# Patient Record
Sex: Female | Born: 1965 | Race: White | Hispanic: No | Smoking: Current every day smoker
Health system: Southern US, Community
[De-identification: ages and names within clinical notes are randomized; demographics above are authoritative.]

## PROBLEM LIST (undated history)

## (undated) DIAGNOSIS — D649 Anemia, unspecified: Secondary | ICD-10-CM

## (undated) DIAGNOSIS — K3184 Gastroparesis: Secondary | ICD-10-CM

## (undated) DIAGNOSIS — E279 Disorder of adrenal gland, unspecified: Secondary | ICD-10-CM

## (undated) DIAGNOSIS — M1990 Unspecified osteoarthritis, unspecified site: Secondary | ICD-10-CM

## (undated) DIAGNOSIS — K069 Disorder of gingiva and edentulous alveolar ridge, unspecified: Secondary | ICD-10-CM

## (undated) DIAGNOSIS — N390 Urinary tract infection, site not specified: Secondary | ICD-10-CM

## (undated) DIAGNOSIS — M797 Fibromyalgia: Secondary | ICD-10-CM

## (undated) DIAGNOSIS — M8580 Other specified disorders of bone density and structure, unspecified site: Secondary | ICD-10-CM

## (undated) DIAGNOSIS — F419 Anxiety disorder, unspecified: Secondary | ICD-10-CM

## (undated) DIAGNOSIS — K589 Irritable bowel syndrome without diarrhea: Secondary | ICD-10-CM

## (undated) DIAGNOSIS — R0602 Shortness of breath: Secondary | ICD-10-CM

## (undated) DIAGNOSIS — E1140 Type 2 diabetes mellitus with diabetic neuropathy, unspecified: Secondary | ICD-10-CM

## (undated) DIAGNOSIS — F32A Depression, unspecified: Secondary | ICD-10-CM

## (undated) DIAGNOSIS — A048 Other specified bacterial intestinal infections: Secondary | ICD-10-CM

## (undated) DIAGNOSIS — E785 Hyperlipidemia, unspecified: Secondary | ICD-10-CM

## (undated) DIAGNOSIS — F329 Major depressive disorder, single episode, unspecified: Secondary | ICD-10-CM

## (undated) DIAGNOSIS — R87619 Unspecified abnormal cytological findings in specimens from cervix uteri: Secondary | ICD-10-CM

## (undated) DIAGNOSIS — K219 Gastro-esophageal reflux disease without esophagitis: Secondary | ICD-10-CM

## (undated) DIAGNOSIS — I739 Peripheral vascular disease, unspecified: Secondary | ICD-10-CM

## (undated) DIAGNOSIS — E119 Type 2 diabetes mellitus without complications: Secondary | ICD-10-CM

## (undated) DIAGNOSIS — I10 Essential (primary) hypertension: Secondary | ICD-10-CM

## (undated) DIAGNOSIS — T7840XA Allergy, unspecified, initial encounter: Secondary | ICD-10-CM

## (undated) DIAGNOSIS — E278 Other specified disorders of adrenal gland: Secondary | ICD-10-CM

## (undated) HISTORY — DX: Unspecified osteoarthritis, unspecified site: M19.90

## (undated) HISTORY — DX: Hyperlipidemia, unspecified: E78.5

## (undated) HISTORY — DX: Unspecified abnormal cytological findings in specimens from cervix uteri: R87.619

## (undated) HISTORY — PX: TUBAL LIGATION: SHX77

## (undated) HISTORY — DX: Other specified disorders of adrenal gland: E27.8

## (undated) HISTORY — PX: DENTAL SURGERY: SHX609

## (undated) HISTORY — PX: ADENOIDECTOMY: SUR15

## (undated) HISTORY — DX: Other specified bacterial intestinal infections: A04.8

## (undated) HISTORY — PX: TYMPANOPLASTY: SHX33

## (undated) HISTORY — DX: Type 2 diabetes mellitus without complications: E11.9

## (undated) HISTORY — DX: Disorder of adrenal gland, unspecified: E27.9

## (undated) HISTORY — DX: Irritable bowel syndrome, unspecified: K58.9

## (undated) HISTORY — DX: Fibromyalgia: M79.7

## (undated) HISTORY — DX: Allergy, unspecified, initial encounter: T78.40XA

## (undated) HISTORY — DX: Peripheral vascular disease, unspecified: I73.9

---

## 1999-03-16 ENCOUNTER — Encounter: Admission: RE | Admit: 1999-03-16 | Discharge: 1999-03-16 | Payer: Self-pay | Admitting: Family Medicine

## 2000-01-26 ENCOUNTER — Encounter: Admission: RE | Admit: 2000-01-26 | Discharge: 2000-01-26 | Payer: Self-pay | Admitting: Family Medicine

## 2000-01-28 ENCOUNTER — Encounter: Admission: RE | Admit: 2000-01-28 | Discharge: 2000-01-28 | Payer: Self-pay | Admitting: Family Medicine

## 2002-03-24 ENCOUNTER — Emergency Department (HOSPITAL_COMMUNITY): Admission: EM | Admit: 2002-03-24 | Discharge: 2002-03-24 | Payer: Self-pay | Admitting: *Deleted

## 2002-04-09 ENCOUNTER — Encounter: Admission: RE | Admit: 2002-04-09 | Discharge: 2002-04-09 | Payer: Self-pay | Admitting: Family Medicine

## 2003-01-30 ENCOUNTER — Emergency Department (HOSPITAL_COMMUNITY): Admission: EM | Admit: 2003-01-30 | Discharge: 2003-01-30 | Payer: Self-pay | Admitting: Emergency Medicine

## 2003-02-13 ENCOUNTER — Encounter: Admission: RE | Admit: 2003-02-13 | Discharge: 2003-02-13 | Payer: Self-pay | Admitting: Family Medicine

## 2003-03-20 ENCOUNTER — Encounter: Admission: RE | Admit: 2003-03-20 | Discharge: 2003-03-20 | Payer: Self-pay | Admitting: Sports Medicine

## 2003-07-16 ENCOUNTER — Encounter: Admission: RE | Admit: 2003-07-16 | Discharge: 2003-07-16 | Payer: Self-pay | Admitting: Family Medicine

## 2003-09-17 ENCOUNTER — Emergency Department (HOSPITAL_COMMUNITY): Admission: AD | Admit: 2003-09-17 | Discharge: 2003-09-17 | Payer: Self-pay | Admitting: Family Medicine

## 2003-10-13 ENCOUNTER — Encounter: Admission: RE | Admit: 2003-10-13 | Discharge: 2003-10-13 | Payer: Self-pay | Admitting: Family Medicine

## 2003-10-13 ENCOUNTER — Other Ambulatory Visit: Admission: RE | Admit: 2003-10-13 | Discharge: 2003-10-13 | Payer: Self-pay | Admitting: Family Medicine

## 2003-11-12 ENCOUNTER — Encounter: Admission: RE | Admit: 2003-11-12 | Discharge: 2003-11-12 | Payer: Self-pay | Admitting: Sports Medicine

## 2004-09-04 ENCOUNTER — Emergency Department (HOSPITAL_COMMUNITY): Admission: EM | Admit: 2004-09-04 | Discharge: 2004-09-04 | Payer: Self-pay | Admitting: Emergency Medicine

## 2005-06-09 ENCOUNTER — Emergency Department (HOSPITAL_COMMUNITY): Admission: EM | Admit: 2005-06-09 | Discharge: 2005-06-09 | Payer: Self-pay | Admitting: Emergency Medicine

## 2005-09-01 ENCOUNTER — Emergency Department (HOSPITAL_COMMUNITY): Admission: EM | Admit: 2005-09-01 | Discharge: 2005-09-01 | Payer: Self-pay | Admitting: Emergency Medicine

## 2005-12-30 ENCOUNTER — Emergency Department (HOSPITAL_COMMUNITY): Admission: EM | Admit: 2005-12-30 | Discharge: 2005-12-30 | Payer: Self-pay | Admitting: Emergency Medicine

## 2006-02-18 ENCOUNTER — Encounter (INDEPENDENT_AMBULATORY_CARE_PROVIDER_SITE_OTHER): Payer: Self-pay | Admitting: *Deleted

## 2006-02-18 LAB — CONVERTED CEMR LAB

## 2006-03-09 ENCOUNTER — Other Ambulatory Visit: Admission: RE | Admit: 2006-03-09 | Discharge: 2006-03-09 | Payer: Self-pay | Admitting: Family Medicine

## 2006-03-09 ENCOUNTER — Ambulatory Visit: Payer: Self-pay | Admitting: Family Medicine

## 2006-03-22 ENCOUNTER — Ambulatory Visit: Payer: Self-pay | Admitting: Sports Medicine

## 2006-04-19 ENCOUNTER — Ambulatory Visit: Payer: Self-pay | Admitting: Family Medicine

## 2006-04-19 ENCOUNTER — Ambulatory Visit: Payer: Self-pay | Admitting: Sports Medicine

## 2006-04-27 ENCOUNTER — Encounter: Admission: RE | Admit: 2006-04-27 | Discharge: 2006-07-26 | Payer: Self-pay | Admitting: Sports Medicine

## 2006-05-03 ENCOUNTER — Encounter: Admission: RE | Admit: 2006-05-03 | Discharge: 2006-05-03 | Payer: Self-pay | Admitting: Sports Medicine

## 2006-08-17 DIAGNOSIS — K589 Irritable bowel syndrome without diarrhea: Secondary | ICD-10-CM | POA: Insufficient documentation

## 2006-08-17 DIAGNOSIS — N809 Endometriosis, unspecified: Secondary | ICD-10-CM

## 2006-08-17 DIAGNOSIS — F17200 Nicotine dependence, unspecified, uncomplicated: Secondary | ICD-10-CM | POA: Insufficient documentation

## 2006-08-17 HISTORY — DX: Endometriosis, unspecified: N80.9

## 2006-08-18 ENCOUNTER — Encounter (INDEPENDENT_AMBULATORY_CARE_PROVIDER_SITE_OTHER): Payer: Self-pay | Admitting: *Deleted

## 2006-10-12 ENCOUNTER — Emergency Department (HOSPITAL_COMMUNITY): Admission: EM | Admit: 2006-10-12 | Discharge: 2006-10-12 | Payer: Self-pay | Admitting: Emergency Medicine

## 2006-10-17 ENCOUNTER — Telehealth: Payer: Self-pay | Admitting: *Deleted

## 2006-10-17 ENCOUNTER — Encounter: Payer: Self-pay | Admitting: *Deleted

## 2006-11-20 ENCOUNTER — Ambulatory Visit: Payer: Self-pay | Admitting: Family Medicine

## 2006-11-20 ENCOUNTER — Encounter (INDEPENDENT_AMBULATORY_CARE_PROVIDER_SITE_OTHER): Payer: Self-pay | Admitting: Family Medicine

## 2006-11-20 DIAGNOSIS — R5381 Other malaise: Secondary | ICD-10-CM | POA: Insufficient documentation

## 2006-11-20 DIAGNOSIS — IMO0001 Reserved for inherently not codable concepts without codable children: Secondary | ICD-10-CM | POA: Insufficient documentation

## 2006-11-20 DIAGNOSIS — R5383 Other fatigue: Secondary | ICD-10-CM

## 2006-11-20 LAB — CONVERTED CEMR LAB
ALT: 11 units/L (ref 0–35)
ANA Titer 1: 1:40 {titer} — ABNORMAL HIGH
AST: 9 units/L (ref 0–37)
Albumin: 4.3 g/dL (ref 3.5–5.2)
Alkaline Phosphatase: 59 units/L (ref 39–117)
Anti Nuclear Antibody(ANA): POSITIVE — AB
BUN: 16 mg/dL (ref 6–23)
CO2: 20 meq/L (ref 19–32)
Calcium: 9.3 mg/dL (ref 8.4–10.5)
Chloride: 107 meq/L (ref 96–112)
Creatinine, Ser: 0.91 mg/dL (ref 0.40–1.20)
Glucose, Bld: 100 mg/dL — ABNORMAL HIGH (ref 70–99)
HCT: 38.9 % (ref 36.0–46.0)
Hemoglobin: 12.5 g/dL (ref 12.0–15.0)
MCHC: 32.1 g/dL (ref 30.0–36.0)
MCV: 85.5 fL (ref 78.0–100.0)
Platelets: 482 10*3/uL — ABNORMAL HIGH (ref 150–400)
Potassium: 4.2 meq/L (ref 3.5–5.3)
RBC: 4.55 M/uL (ref 3.87–5.11)
RDW: 14.9 % — ABNORMAL HIGH (ref 11.5–14.0)
Rhuematoid fact SerPl-aCnc: <20 intl units/mL (ref 0–20)
Sed Rate: 10 mm/hr (ref 0–22)
Sodium: 139 meq/L (ref 135–145)
TSH: 2.426 microintl units/mL (ref 0.350–5.50)
Total Bilirubin: 0.1 mg/dL — ABNORMAL LOW (ref 0.3–1.2)
Total Protein: 7 g/dL (ref 6.0–8.3)
Vitamin B-12: 211 pg/mL (ref 211–911)
WBC: 10.2 10*3/uL (ref 4.0–10.5)

## 2006-12-07 ENCOUNTER — Ambulatory Visit: Payer: Self-pay | Admitting: Family Medicine

## 2006-12-13 ENCOUNTER — Telehealth: Payer: Self-pay | Admitting: *Deleted

## 2007-01-03 ENCOUNTER — Ambulatory Visit: Payer: Self-pay

## 2007-01-03 ENCOUNTER — Encounter (INDEPENDENT_AMBULATORY_CARE_PROVIDER_SITE_OTHER): Payer: Self-pay | Admitting: *Deleted

## 2007-01-03 LAB — CONVERTED CEMR LAB
Bilirubin Urine: NEGATIVE
Blood in Urine, dipstick: NEGATIVE
Epithelial cells, urine: >20 /lpf
Glucose, Urine, Semiquant: NEGATIVE
Ketones, urine, test strip: NEGATIVE
Nitrite: POSITIVE
Protein, U semiquant: NEGATIVE
Specific Gravity, Urine: 1.025
Urobilinogen, UA: 0.2
pH: 6

## 2007-01-04 ENCOUNTER — Encounter (INDEPENDENT_AMBULATORY_CARE_PROVIDER_SITE_OTHER): Payer: Self-pay | Admitting: *Deleted

## 2007-02-02 ENCOUNTER — Telehealth (INDEPENDENT_AMBULATORY_CARE_PROVIDER_SITE_OTHER): Payer: Self-pay | Admitting: *Deleted

## 2007-02-02 ENCOUNTER — Ambulatory Visit: Payer: Self-pay | Admitting: Family Medicine

## 2007-03-15 ENCOUNTER — Ambulatory Visit: Payer: Self-pay | Admitting: Sports Medicine

## 2007-03-16 ENCOUNTER — Encounter: Payer: Self-pay | Admitting: Family Medicine

## 2007-03-22 ENCOUNTER — Encounter (INDEPENDENT_AMBULATORY_CARE_PROVIDER_SITE_OTHER): Payer: Self-pay | Admitting: *Deleted

## 2007-03-22 ENCOUNTER — Ambulatory Visit: Payer: Self-pay | Admitting: Family Medicine

## 2007-03-22 ENCOUNTER — Telehealth (INDEPENDENT_AMBULATORY_CARE_PROVIDER_SITE_OTHER): Payer: Self-pay | Admitting: *Deleted

## 2007-03-22 DIAGNOSIS — J309 Allergic rhinitis, unspecified: Secondary | ICD-10-CM | POA: Insufficient documentation

## 2007-04-02 ENCOUNTER — Encounter: Payer: Self-pay | Admitting: Family Medicine

## 2007-04-02 ENCOUNTER — Ambulatory Visit: Payer: Self-pay | Admitting: Family Medicine

## 2007-04-02 ENCOUNTER — Other Ambulatory Visit: Admission: RE | Admit: 2007-04-02 | Discharge: 2007-04-02 | Payer: Self-pay | Admitting: Family Medicine

## 2007-04-02 LAB — CONVERTED CEMR LAB: Pap Smear: NORMAL

## 2007-04-03 LAB — CONVERTED CEMR LAB
Chlamydia, DNA Probe: NEGATIVE
GC Probe Amp, Genital: NEGATIVE

## 2007-04-05 ENCOUNTER — Encounter: Payer: Self-pay | Admitting: Family Medicine

## 2007-05-28 ENCOUNTER — Telehealth: Payer: Self-pay | Admitting: *Deleted

## 2007-06-05 ENCOUNTER — Encounter: Payer: Self-pay | Admitting: *Deleted

## 2007-09-14 ENCOUNTER — Telehealth: Payer: Self-pay | Admitting: *Deleted

## 2007-10-09 ENCOUNTER — Encounter: Payer: Self-pay | Admitting: *Deleted

## 2008-03-04 ENCOUNTER — Encounter: Payer: Self-pay | Admitting: Family Medicine

## 2008-03-09 ENCOUNTER — Encounter: Payer: Self-pay | Admitting: Family Medicine

## 2008-04-07 ENCOUNTER — Ambulatory Visit: Payer: Self-pay | Admitting: Family Medicine

## 2008-04-07 ENCOUNTER — Encounter: Payer: Self-pay | Admitting: Family Medicine

## 2008-04-07 DIAGNOSIS — F329 Major depressive disorder, single episode, unspecified: Secondary | ICD-10-CM | POA: Insufficient documentation

## 2008-04-07 DIAGNOSIS — K219 Gastro-esophageal reflux disease without esophagitis: Secondary | ICD-10-CM | POA: Insufficient documentation

## 2008-04-07 DIAGNOSIS — R259 Unspecified abnormal involuntary movements: Secondary | ICD-10-CM | POA: Insufficient documentation

## 2008-04-07 DIAGNOSIS — F411 Generalized anxiety disorder: Secondary | ICD-10-CM | POA: Insufficient documentation

## 2008-04-09 LAB — CONVERTED CEMR LAB
Amphetamine Screen, Ur: NEGATIVE
BUN: 12 mg/dL (ref 6–23)
Barbiturate Quant, Ur: NEGATIVE
Benzodiazepines.: NEGATIVE
CO2: 19 meq/L (ref 19–32)
Calcium: 9.1 mg/dL (ref 8.4–10.5)
Chloride: 106 meq/L (ref 96–112)
Cocaine Metabolites: POSITIVE — AB
Creatinine, Ser: 1.02 mg/dL (ref 0.40–1.20)
Creatinine,U: 36.1 mg/dL
Ethyl Alcohol: <10 mg/dL (ref <10)
Glucose, Bld: 107 mg/dL — ABNORMAL HIGH (ref 70–99)
Marijuana Metabolite: NEGATIVE
Methadone: NEGATIVE
Opiate Screen, Urine: NEGATIVE
Phencyclidine (PCP): NEGATIVE
Potassium: 4.6 meq/L (ref 3.5–5.3)
Propoxyphene: NEGATIVE
Sodium: 138 meq/L (ref 135–145)
TSH: 3.156 microintl units/mL (ref 0.350–4.50)

## 2009-04-16 ENCOUNTER — Emergency Department (HOSPITAL_COMMUNITY): Admission: EM | Admit: 2009-04-16 | Discharge: 2009-04-16 | Payer: Self-pay | Admitting: Emergency Medicine

## 2009-04-29 ENCOUNTER — Ambulatory Visit: Payer: Self-pay | Admitting: Nurse Practitioner

## 2009-04-29 DIAGNOSIS — R030 Elevated blood-pressure reading, without diagnosis of hypertension: Secondary | ICD-10-CM | POA: Insufficient documentation

## 2009-06-01 ENCOUNTER — Ambulatory Visit: Payer: Self-pay | Admitting: Nurse Practitioner

## 2009-06-01 ENCOUNTER — Other Ambulatory Visit: Admission: RE | Admit: 2009-06-01 | Discharge: 2009-06-01 | Payer: Self-pay | Admitting: Internal Medicine

## 2009-06-01 DIAGNOSIS — N3941 Urge incontinence: Secondary | ICD-10-CM | POA: Insufficient documentation

## 2009-06-01 LAB — CONVERTED CEMR LAB
Bilirubin Urine: NEGATIVE
Blood in Urine, dipstick: NEGATIVE
Glucose, Urine, Semiquant: NEGATIVE
KOH Prep: NEGATIVE
Ketones, urine, test strip: NEGATIVE
Nitrite: NEGATIVE
OCCULT 1: NEGATIVE
Protein, U semiquant: 30
Rapid HIV Screen: NEGATIVE
Specific Gravity, Urine: 1.025
Urobilinogen, UA: 0.2
WBC Urine, dipstick: NEGATIVE
pH: 5

## 2009-06-02 ENCOUNTER — Encounter (INDEPENDENT_AMBULATORY_CARE_PROVIDER_SITE_OTHER): Payer: Self-pay | Admitting: Nurse Practitioner

## 2009-06-04 ENCOUNTER — Telehealth (INDEPENDENT_AMBULATORY_CARE_PROVIDER_SITE_OTHER): Payer: Self-pay | Admitting: Nurse Practitioner

## 2009-06-04 ENCOUNTER — Encounter (INDEPENDENT_AMBULATORY_CARE_PROVIDER_SITE_OTHER): Payer: Self-pay | Admitting: Nurse Practitioner

## 2009-06-04 ENCOUNTER — Encounter (INDEPENDENT_AMBULATORY_CARE_PROVIDER_SITE_OTHER): Payer: Self-pay | Admitting: *Deleted

## 2009-06-04 DIAGNOSIS — E559 Vitamin D deficiency, unspecified: Secondary | ICD-10-CM | POA: Insufficient documentation

## 2009-06-04 DIAGNOSIS — E7800 Pure hypercholesterolemia, unspecified: Secondary | ICD-10-CM | POA: Insufficient documentation

## 2009-06-04 LAB — CONVERTED CEMR LAB
ALT: 23 units/L (ref 0–35)
ANA Titer 1: 1:40 {titer} — ABNORMAL HIGH
AST: 15 units/L (ref 0–37)
Albumin: 4.5 g/dL (ref 3.5–5.2)
Alkaline Phosphatase: 77 units/L (ref 39–117)
Anti Nuclear Antibody(ANA): POSITIVE — AB
BUN: 13 mg/dL (ref 6–23)
Basophils Absolute: 0 10*3/uL (ref 0.0–0.1)
Basophils Relative: 0 % (ref 0–1)
CO2: 22 meq/L (ref 19–32)
CRP: 1.2 mg/dL — ABNORMAL HIGH (ref <0.6)
Calcium: 9.7 mg/dL (ref 8.4–10.5)
Chlamydia, DNA Probe: NEGATIVE
Chloride: 104 meq/L (ref 96–112)
Cholesterol: 250 mg/dL — ABNORMAL HIGH (ref 0–200)
Creatinine, Ser: 0.91 mg/dL (ref 0.40–1.20)
ENA SM Ab Ser-aCnc: <0.2 (ref <1.0)
Eosinophils Absolute: 0.2 10*3/uL (ref 0.0–0.7)
Eosinophils Relative: 2 % (ref 0–5)
GC Probe Amp, Genital: NEGATIVE
Glucose, Bld: 101 mg/dL — ABNORMAL HIGH (ref 70–99)
HCT: 43.9 % (ref 36.0–46.0)
HCV Ab: NEGATIVE
HDL: 41 mg/dL (ref >39)
Hemoglobin: 14.5 g/dL (ref 12.0–15.0)
Hep A Total Ab: NEGATIVE
Hep B Core Total Ab: NEGATIVE
Hep B S Ab: NEGATIVE
LDL Cholesterol: 144 mg/dL — ABNORMAL HIGH (ref 0–99)
Lymphocytes Relative: 22 % (ref 12–46)
Lymphs Abs: 2.3 10*3/uL (ref 0.7–4.0)
MCHC: 33 g/dL (ref 30.0–36.0)
MCV: 89.2 fL (ref 78.0–100.0)
Microalb, Ur: 2.8 mg/dL — ABNORMAL HIGH (ref 0.00–1.89)
Monocytes Absolute: 0.3 10*3/uL (ref 0.1–1.0)
Monocytes Relative: 3 % (ref 3–12)
Neutro Abs: 7.3 10*3/uL (ref 1.7–7.7)
Neutrophils Relative %: 73 % (ref 43–77)
Platelets: 465 10*3/uL — ABNORMAL HIGH (ref 150–400)
Potassium: 5 meq/L (ref 3.5–5.3)
RBC: 4.92 M/uL (ref 3.87–5.11)
RDW: 14.2 % (ref 11.5–15.5)
Rhuematoid fact SerPl-aCnc: <20 intl units/mL (ref 0–20)
Sed Rate: 4 mm/hr (ref 0–22)
Sodium: 140 meq/L (ref 135–145)
TSH: 2.46 microintl units/mL (ref 0.350–4.500)
Total Bilirubin: 0.5 mg/dL (ref 0.3–1.2)
Total CHOL/HDL Ratio: 6.1
Total Protein: 7.3 g/dL (ref 6.0–8.3)
Triglycerides: 324 mg/dL — ABNORMAL HIGH (ref <150)
VLDL: 65 mg/dL — ABNORMAL HIGH (ref 0–40)
Vit D, 25-Hydroxy: 6 ng/mL — ABNORMAL LOW (ref 30–89)
WBC: 10.1 10*3/uL (ref 4.0–10.5)
ds DNA Ab: <1 (ref <5)

## 2009-06-05 ENCOUNTER — Encounter (INDEPENDENT_AMBULATORY_CARE_PROVIDER_SITE_OTHER): Payer: Self-pay | Admitting: Nurse Practitioner

## 2009-06-09 ENCOUNTER — Ambulatory Visit (HOSPITAL_COMMUNITY): Admission: RE | Admit: 2009-06-09 | Discharge: 2009-06-09 | Payer: Self-pay | Admitting: Internal Medicine

## 2009-06-17 ENCOUNTER — Ambulatory Visit: Payer: Self-pay | Admitting: Nurse Practitioner

## 2009-06-17 DIAGNOSIS — R7309 Other abnormal glucose: Secondary | ICD-10-CM | POA: Insufficient documentation

## 2009-06-17 DIAGNOSIS — R799 Abnormal finding of blood chemistry, unspecified: Secondary | ICD-10-CM | POA: Insufficient documentation

## 2009-06-17 DIAGNOSIS — N760 Acute vaginitis: Secondary | ICD-10-CM | POA: Insufficient documentation

## 2009-06-17 LAB — CONVERTED CEMR LAB
Cholesterol, target level: 200 mg/dL
HDL goal, serum: 40 mg/dL
LDL Goal: 160 mg/dL

## 2009-08-03 ENCOUNTER — Encounter (INDEPENDENT_AMBULATORY_CARE_PROVIDER_SITE_OTHER): Payer: Self-pay | Admitting: Nurse Practitioner

## 2009-09-02 ENCOUNTER — Encounter (INDEPENDENT_AMBULATORY_CARE_PROVIDER_SITE_OTHER): Payer: Self-pay | Admitting: Nurse Practitioner

## 2009-09-07 ENCOUNTER — Ambulatory Visit: Payer: Self-pay | Admitting: Nurse Practitioner

## 2009-09-07 LAB — CONVERTED CEMR LAB: Blood Glucose, AC Bkfst: 108 mg/dL

## 2009-09-10 ENCOUNTER — Ambulatory Visit: Payer: Self-pay | Admitting: Nurse Practitioner

## 2009-09-10 LAB — CONVERTED CEMR LAB
Cholesterol: 223 mg/dL — ABNORMAL HIGH (ref 0–200)
HDL: 45 mg/dL (ref >39)
LDL Cholesterol: 134 mg/dL — ABNORMAL HIGH (ref 0–99)
Total CHOL/HDL Ratio: 5
Triglycerides: 219 mg/dL — ABNORMAL HIGH (ref <150)
VLDL: 44 mg/dL — ABNORMAL HIGH (ref 0–40)
Vit D, 25-Hydroxy: 21 ng/mL — ABNORMAL LOW (ref 30–89)

## 2009-09-14 ENCOUNTER — Ambulatory Visit: Payer: Self-pay | Admitting: Nurse Practitioner

## 2009-09-24 ENCOUNTER — Encounter (INDEPENDENT_AMBULATORY_CARE_PROVIDER_SITE_OTHER): Payer: Self-pay | Admitting: Nurse Practitioner

## 2009-10-22 ENCOUNTER — Ambulatory Visit: Payer: Self-pay | Admitting: Nurse Practitioner

## 2009-12-25 ENCOUNTER — Encounter (INDEPENDENT_AMBULATORY_CARE_PROVIDER_SITE_OTHER): Payer: Self-pay | Admitting: Nurse Practitioner

## 2010-02-05 ENCOUNTER — Ambulatory Visit: Payer: Self-pay | Admitting: Nurse Practitioner

## 2010-02-05 DIAGNOSIS — G4700 Insomnia, unspecified: Secondary | ICD-10-CM | POA: Insufficient documentation

## 2010-04-16 ENCOUNTER — Emergency Department (HOSPITAL_COMMUNITY): Admission: EM | Admit: 2010-04-16 | Discharge: 2010-04-16 | Payer: Self-pay | Admitting: Family Medicine

## 2010-06-08 ENCOUNTER — Encounter (INDEPENDENT_AMBULATORY_CARE_PROVIDER_SITE_OTHER): Payer: Self-pay | Admitting: Nurse Practitioner

## 2010-06-08 ENCOUNTER — Ambulatory Visit: Payer: Self-pay | Admitting: Nurse Practitioner

## 2010-06-08 DIAGNOSIS — E669 Obesity, unspecified: Secondary | ICD-10-CM | POA: Insufficient documentation

## 2010-06-08 DIAGNOSIS — R300 Dysuria: Secondary | ICD-10-CM | POA: Insufficient documentation

## 2010-06-08 DIAGNOSIS — M25569 Pain in unspecified knee: Secondary | ICD-10-CM | POA: Insufficient documentation

## 2010-06-08 LAB — CONVERTED CEMR LAB
Bilirubin Urine: NEGATIVE
Glucose, Urine, Semiquant: NEGATIVE
Ketones, urine, test strip: NEGATIVE
Nitrite: NEGATIVE
Protein, U semiquant: NEGATIVE
Rapid HIV Screen: NEGATIVE
Specific Gravity, Urine: >=1.03
Urobilinogen, UA: 0.2
pH: 5.5

## 2010-06-15 ENCOUNTER — Encounter (INDEPENDENT_AMBULATORY_CARE_PROVIDER_SITE_OTHER): Payer: Self-pay | Admitting: Nurse Practitioner

## 2010-06-15 DIAGNOSIS — E039 Hypothyroidism, unspecified: Secondary | ICD-10-CM | POA: Insufficient documentation

## 2010-06-15 LAB — CONVERTED CEMR LAB
ALT: 8 units/L (ref 0–35)
AST: 10 units/L (ref 0–37)
Albumin: 4.3 g/dL (ref 3.5–5.2)
Alkaline Phosphatase: 64 units/L (ref 39–117)
BUN: 10 mg/dL (ref 6–23)
Basophils Absolute: 0 10*3/uL (ref 0.0–0.1)
Basophils Relative: 0 % (ref 0–1)
CO2: 23 meq/L (ref 19–32)
Calcium: 9.3 mg/dL (ref 8.4–10.5)
Chloride: 105 meq/L (ref 96–112)
Cholesterol: 195 mg/dL (ref 0–200)
Creatinine, Ser: 1.02 mg/dL (ref 0.40–1.20)
Eosinophils Absolute: 0.3 10*3/uL (ref 0.0–0.7)
Eosinophils Relative: 2 % (ref 0–5)
Glucose, Bld: 115 mg/dL — ABNORMAL HIGH (ref 70–99)
HCT: 46 % (ref 36.0–46.0)
HDL: 41 mg/dL (ref >39)
Hemoglobin: 14.1 g/dL (ref 12.0–15.0)
LDL Cholesterol: 119 mg/dL — ABNORMAL HIGH (ref 0–99)
Lymphocytes Relative: 32 % (ref 12–46)
Lymphs Abs: 3.5 10*3/uL (ref 0.7–4.0)
MCHC: 30.7 g/dL (ref 30.0–36.0)
MCV: 91.5 fL (ref 78.0–100.0)
Microalb, Ur: 1.48 mg/dL (ref 0.00–1.89)
Monocytes Absolute: 0.5 10*3/uL (ref 0.1–1.0)
Monocytes Relative: 4 % (ref 3–12)
Neutro Abs: 6.7 10*3/uL (ref 1.7–7.7)
Neutrophils Relative %: 61 % (ref 43–77)
Platelets: 475 10*3/uL — ABNORMAL HIGH (ref 150–400)
Potassium: 4 meq/L (ref 3.5–5.3)
RBC: 5.03 M/uL (ref 3.87–5.11)
RDW: 14.8 % (ref 11.5–15.5)
Sodium: 137 meq/L (ref 135–145)
TSH: 5.998 microintl units/mL — ABNORMAL HIGH (ref 0.350–4.500)
Total Bilirubin: 0.2 mg/dL — ABNORMAL LOW (ref 0.3–1.2)
Total CHOL/HDL Ratio: 4.8
Total Protein: 7 g/dL (ref 6.0–8.3)
Triglycerides: 177 mg/dL — ABNORMAL HIGH (ref <150)
VLDL: 35 mg/dL (ref 0–40)
WBC: 10.9 10*3/uL — ABNORMAL HIGH (ref 4.0–10.5)

## 2010-06-24 ENCOUNTER — Ambulatory Visit (HOSPITAL_COMMUNITY): Admission: RE | Admit: 2010-06-24 | Payer: Self-pay | Source: Home / Self Care | Admitting: Internal Medicine

## 2010-07-20 NOTE — Assessment & Plan Note (Signed)
Summary: untreated chronic issues (noncompliance)   Vital Signs:  Patient Profile:   45 Years Old Female Height:     64.25 inches Weight:      195 pounds BMI:     33.33 Temp:     97.9 degrees F oral Pulse rate:   94 / minute BP sitting:   144 / 86  (left arm)  Pt. in pain?   no  Vitals Entered By: Dedra Skeens CMA, (April 07, 2008 11:15 AM)                      PCP:  Marisue Ivan  MD  Chief Complaint:  tremors and generalized pain.  History of Present Illness: 45yo WF w/ multiple medical issues here b/c of tremors and generalized pain.  Tremors: States that the tremors are located in all extremities.  It has been going on for the past few months but has worsened over the past few weeks.  Denies any hx of tremors in the past.  Denies any use of benzos, alcohol, or drugs.  Denies any trauma or fever.  She has not been seen in the clinic in over a year and has been off of all of her meds for several months b/c she cannot afford them.    Generalized pain: She was diagnosed in the past by another physician with fibromyalgia.  She states that she constantly hurts all over.  States that she has been denied disability.  Was at one point on oxycodone and flexeril and amitriptyline but no longer taking those meds b/c of financial reasons.  Pain is described as shooting pains at times.    Depression: States that she has major depression that was initially treated with Amitriptyline but no longer taking med as described above.  Denies any suicidal or homicial ideations.  Endorses insomnia, dec appetite, anhedonia, and feeling sad.    GERD: Symptomatic with epigastric and sternal burning after meals.  Has been off of her Omeprazole for months.    Current Allergies: ! NEOMYCIN  Past Medical History:    [id number]  (NSVD x 2)    Depression    Chronic pain  Past Surgical History:    BTL - 06/20/1994   Family History:    Grandfather- Lung CA    Grandmother- Breast CA, DM  II    Father- HTN    Sister- Lupus, DM II         Physical Exam  General:     Nonhealthy appearing, disshelved, tearful WF, in mild distress Eyes:     EOMI, PERRLA Lungs:     Normal respiratory effort, chest expands symmetrically. Lungs are clear to auscultation, no crackles or wheezes. Heart:     Normal rate and regular rhythm. S1 and S2 normal without gallop, murmur, click, rub or other extra sounds. Extremities:     intermittent tremor of upper ext b/l Neurologic:     5/5 strength in all ext; 2+ dtrs; alert & oriented X3 and cranial nerves II-XII intact.   Skin:     no markings on arms Psych:     emotionally labile; tearful throughout most of exam    Impression & Recommendations:  Problem # 1:  TREMOR (ICD-781.0) Assessment: New Uncertain what the actual etiology of her tremors.  She denies any drug or alcohol use or withdrawal.  No neurological deficit seen on exam.  Plan to check BMET, TSH, and UDS.  I suspect that some of this may be  psychological and manifesting as physical symptoms.  Will f/u in 2 weeks.   Orders: [facility]- Est  Level 4 (36644)   Problem # 2:  PAIN IN JOINT, MULTIPLE SITES (ICD-719.49) Assessment: Unchanged There is no definite reason for her to have pain although she states that she has had a prior dx of fibromyalgia.  I am hesistant to provide narcotics as she has proven to be unreliable in the past.  I will restart her amitriptyline as this has helped in the past and she currently denies any suicidal ideations.  Will also start her on gabapentin as some of the hx could be c/w neurological pain.    Orders: Basic Met-[facility] [id number]) TSH-[facility] [phone]) Miscellaneous Lab Charge-[facility] [phone]) [facility]- Est  Level 4 (16606)   Problem # 3:  DEPRESSION, MAJOR (ICD-296.20) Assessment: Deteriorated She has been untreated for months.  I think many of her symptoms are a result of her untreated depression.  She declines prozac as she states, "it makes me  crave drugs."  I will restart her on amitriptyline as this has helped in the past and it's a medication she can afford.  Will schedule frequent f/u.     Orders: [facility]- Est  Level 4 (30160)   Problem # 4:  GERD (ICD-530.81) Assessment: Deteriorated Untreated for several months.  Will restart on omeprazole today.     Her updated medication list for this problem includes:    Omeprazole 20 Mg Cpdr (Omeprazole) .Marland Kitchen... Take 1 capsule by mouth once a day   Problem # 5:  Preventive Health Care (ICD-V70.0) Assessment: Comment Only She has so many acute issues going on right now that we will have to address her preventative issues such as pap smear and mammogram at another time.  Complete Medication List: 1)  Amitriptyline Hcl 50 Mg Tabs (Amitriptyline hcl) .... One tablet by mouth at bedtime 2)  Flonase 50 Mcg/act Susp (Fluticasone propionate) .... 2 spray into both nostrils once a day 3)  Omeprazole 20 Mg Cpdr (Omeprazole) .... Take 1 capsule by mouth once a day 4)  Gabapentin 300 Mg Caps (Gabapentin) .... One tablet by mouth daily x 3days, then two times a day x 3days, then three times a day   Patient Instructions: 1)  Please schedule a follow-up appointment in 2 weeks. 2)  We inc your amitriptyline to 50mg  and added gabapentin for nerve pain.   3)  We will check some lab work today.   4)  We will connect you with Jaynee Eagles and find a neurologist for you.   Prescriptions: OMEPRAZOLE 20 MG CPDR (OMEPRAZOLE) Take 1 capsule by mouth once a day  #30 x 3   Entered and Authorized by:   Marisue Ivan  MD   Signed by:   Marisue Ivan  MD on 04/07/2008   Method used:   Electronically to        [facility]* (retail)       [address]       [place], [place]  [address]       Ph: [phone]       Fax: [phone]   RxID:   [id number] GABAPENTIN 300 MG CAPS (GABAPENTIN) one tablet by mouth daily x 3days, then two times a day x 3days, then three times a day  #60 x 0    Entered and Authorized by:   Marisue Ivan  MD   Signed by:   Marisue Ivan  MD on 04/07/2008   Method used:  Electronically to        [facility]* (retail)       [address]       [place], [place]  [address]       Ph: [phone]       Fax: [phone]   RxID:   [id number] AMITRIPTYLINE HCL 50 MG TABS (AMITRIPTYLINE HCL) one tablet by mouth at bedtime  #30 x 0   Entered and Authorized by:   Marisue Ivan  MD   Signed by:   Marisue Ivan  MD on 04/07/2008   Method used:   Electronically to        [facility]* (retail)       [address]       [place], [place]  [address]       Ph: [phone]       Fax: [phone]   RxID:   [id number]  ]

## 2010-07-20 NOTE — Assessment & Plan Note (Signed)
Summary: Complete Physical Exam   Vital Signs:  Patient profile:   45 year old female LMP:     05/27/2009 Weight:      184.4 pounds BSA:     1.90 Temp:     97.8 degrees F oral Pulse rate:   75 / minute Pulse rhythm:   regular Resp:     16 per minute BP sitting:   134 / 82  (left arm) Cuff size:   regular  Vitals Entered By: Levon Hedger (June 01, 2009 11:25 AM) CC: CPP, Depression, Abdominal Pain Is Patient Diabetic? No Pain Assessment Patient in pain? yes     Location: hands, hips, feet Intensity: 6 Onset of pain  Chronic  Does patient need assistance? Functional Status Self care Ambulation Normal LMP (date): 05/27/2009     Enter LMP: 05/27/2009 Last PAP Result normal   CC:  CPP, Depression, and Abdominal Pain.  History of Present Illness:  Pt into the office for a complete physical exam. She established in this office on last month  Elevated blood pressure - "Borderline" for the past few years. No current medications. Both parents with hx of htn.  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 walks with a limp  PAP - Last done 2 years ago.  All normal PAp's No family hx of cervial or ovarian CA  Mammogram - no previous mammogram. Maternal grandmother s/p mastectomy  Social - Ex-husband who pt reports cheated and possible exposure to HIV and hepatitis.  Optho - Pt wears glasses for computer eye strain.  She will get an eye exam in 2 months.  Dental - Next dental appt is in 3 months    Depression History:      Positive alarm features for depression include insomnia and fatigue (loss of energy).  However, she denies recurrent thoughts of death or suicide.        Psychosocial stress factors include major life changes.  The patient denies that she feels like life is not worth living, denies  that she wishes that she were dead, and denies that she has thought about ending her life.         Depression Treatment History:  Prior Medication Used:   Start Date: Assessment of Effect:   Comments:  lexapro     05/08/2009   started     improved at f/u visit  Dyspepsia History:      She has no alarm features of dyspepsia including no history of melena, hematochezia, dysphagia, persistent vomiting, or involuntary weight loss > 5%.  There is a prior history of GERD.  The patient does not have a prior history of documented ulcer disease.  The dominant symptom is heartburn or acid reflux.  An H-2 blocker medication is currently being taken.  She notes that the symptoms have improved with the H-2 blocker therapy.  Symptoms have persisted after 4 weeks of H-2 blocker treatment.  She has no history of a positive H. Pylori serology.  No previous upper endoscopy has been done.      Habits & Providers  Alcohol-Tobacco-Diet     Alcohol drinks/day: <1     Alcohol Counseling: not indicated; use of alcohol is not excessive or problematic     Alcohol type: wine     Tobacco Status: current     Tobacco  Counseling: to quit use of tobacco products     Cigarette Packs/Day: 0.5     Year Started: age 69  Exercise-Depression-Behavior     Does Patient Exercise: no     Have you felt down or hopeless? yes     Have you felt little pleasure in things? yes     Depression Counseling: further diagnostic testing and/or other treatment is indicated     Drug Use: past  Comments: Pt has an appointment to see Aquilla Solian - LCSW on tomorrow  Allergies: 1)  ! Neomycin  Review of Systems General:  Complains of sleep disorder; denies fever; sleeping has improved since she started taking the trazodone as needed at night. Eyes:  Denies blurring. ENT:  Denies earache. CV:  Denies chest pain or discomfort. Resp:  Denies cough. GI:  Denies abdominal pain, nausea, and vomiting. GU:  Complains of incontinence;  denies dysuria; with urge. MS:  Complains of joint pain and joint swelling. Derm:  Complains of rash; perineal area . Neuro:  Denies headaches. Psych:  Denies anxiety and depression.  Physical Exam  General:  alert.   Head:  normocephalic.   Eyes:  pupils equal, pupils round, and pupils reactive to light.   Ears:  bil ears with clear fluid right with slight erythema Nose:  no nasal discharge.   Mouth:  pharynx pink and moist and poor dentition.   Neck:  supple.   Chest Wall:  no mass.   Breasts:  skin/areolae normal, no masses, and no abnormal thickening.   Lungs:  normal breath sounds.   Heart:  normal rate and regular rhythm.   Abdomen:  soft, non-tender, and normal bowel sounds.   Rectal:  no external abnormalities.   Pulses:  R radial normal, R dorsalis pedis normal, L radial normal, and L dorsalis pedis normal.   Extremities:  no edema Neurologic:  limping gait Skin:  color normal.   Psych:  Oriented X3.    Pelvic Exam  Vulva:      normal appearance.   Urethra and Bladder:      Urethra--normal.   Vagina:      physiologic discharge.  odorous Cervix:      midposition.   Uterus:      smooth.   Adnexa:      nontender bilaterally.   Rectum:      normal, heme negative stool.      Impression & Recommendations:  Problem # 1:  ROUTINE GYNECOLOGICAL EXAMINATION (ICD-V72.31) maintain optho and dental exam routinely PAP done mammogram ordered self breast exam placcard given guaiac negative labs done EKG done Orders: EKG w/ Interpretation (93000) Rapid HIV  (47829) UA Dipstick W/ Micro (manual) (56213) Hemoccult Guaiac-1 spec.(in office) (82270) T-Lipid Profile (08657-84696) T-Comprehensive Metabolic Panel (29528-41324) T-CBC w/Diff (40102-72536) T-Syphilis Test (RPR) (64403-47425) T-TSH (95638-75643) T-Urine Microalbumin w/creat. ratio [phone]) T- GC Chlamydia (01601)  Problem # 2:  UNSPECIFIED BREAST SCREENING (ICD-V76.10) mammogram  ordered self breast exam placcard given Orders: Mammogram (Screening) (Mammo)  Problem # 3:  FATIGUE (ICD-780.79) will check labs sleep habits have improved since last visit Orders: T-Vitamin D (25-Hydroxy) (09323-55732) T-Sed Rate (Automated) [id number]) T-Rheumatoid Factor [phone]) T-C-Reactive Protein [id number]) T-Antinuclear Antib (ANA) [id number]) T- * Misc. Laboratory test [phone])  Problem # 4:  GENERALIZED ANXIETY DISORDER (ICD-300.02) pt has a appointment scheduled with Aquilla Solian on tomorrow Lexapro is doing well.  Continue  Her updated medication list for this problem includes:    Trazodone Hcl 50 Mg Tabs (Trazodone hcl) .Marland KitchenMarland KitchenMarland KitchenMarland Kitchen  1-2 tablets by mouth nightly as needed for sleep    Lexapro 10 Mg Tabs (Escitalopram oxalate) ..... One tablet by mouth daily for anxiety  Her updated medication list for this problem includes:    Trazodone Hcl 50 Mg Tabs (Trazodone hcl) .Marland Kitchen... 1-2 tablets by mouth nightly as needed for sleep    Lexapro 10 Mg Tabs (Escitalopram oxalate) ..... One tablet by mouth daily for anxiety  Problem # 5:  PAIN IN JOINT, MULTIPLE SITES (ICD-719.49)  Problem # 6:  TOBACCO DEPENDENCE (ICD-305.1)  advised cessation will start chantix advised pt of the side effects Her updated medication list for this problem includes:    Chantix Starting Month Pak 0.5 Mg X 11 & 1 Mg X 42 Tabs (Varenicline tartrate) .Marland Kitchen... Take according to starter instructions  Problem # 7:  SEXUALLY TRANSMITTED DISEASE, EXPOSURE TO (ICD-V01.6) pt is requesting testing due to unfaithful ex-husband Orders: T- * Misc. Laboratory test [phone])  Problem # 8:  ELEVATED BLOOD PRESSURE (ICD-796.2) EKG done advised pt that she may need a low dose diuretic Orders: EKG w/ Interpretation (93000) T-Comprehensive Metabolic Panel (10272-53664) T-CBC w/Diff (40347-42595)  Problem # 9:  INCONTINENCE, URGE (ICD-788.31)  kegel exercises advise pt to wear incontinence pads will check  urine culture Orders: T-Culture, Urine ([id number])  Complete Medication List: 1)  Ranitidine Hcl 150 Mg Tabs (Ranitidine hcl) .... Take 1 tablet daily at bedtime for reflux 2)  Diclofenac Sodium 75 Mg Tbec (Diclofenac sodium) .... One tablet by mouth two times a day for joints 3)  Trazodone Hcl 50 Mg Tabs (Trazodone hcl) .Marland Kitchen.. 1-2 tablets by mouth nightly as needed for sleep 4)  Lexapro 10 Mg Tabs (Escitalopram oxalate) .... One tablet by mouth daily for anxiety 5)  Chantix Starting Month Pak 0.5 Mg X 11 & 1 Mg X 42 Tabs (Varenicline tartrate) .... Take according to starter instructions  Dyspepsia Assessment/Plan:  Step Therapy: GERD Treatment Protocols:    Step-1: started    H-2 blocker chosen: Ranitidine 150mg  by mouth at bedtime  Patient Instructions: 1)  Keep your appointment for mammogram. 2)  Smoking Start chantix starter pack once stable on lexapro. 3)  Smoking - Chantix starter pack sent to the pharmacy.  If is works well then you will need to call this office for refills 4)  You will be notified of any abnormal lab results 5)  Your blood pressure is slightly elevated today and was also today.  You may need medications.  Will continue to monitor 6)  Follow up in this office in 2 months - February 2011 or sooner if necessary for anxiety, elevated blood pressure 7)  Prescriptions for lexapro and chantix sent electronically to the pharmacy - [facility] [address] Prescriptions: CHANTIX STARTING MONTH PAK 0.5 MG X 11 & 1 MG X 42 TABS (VARENICLINE TARTRATE) Take according to starter instructions  #1 month qs x 0   Entered and Authorized by:   Lehman Prom FNP   Signed by:   Lehman Prom FNP on 06/01/2009   Method used:   Electronically to        [facility] [phone]* (retail)       [address]       [place], [place]  [address]       Ph: [phone]       Fax: [phone]   RxID:   [id number] LEXAPRO 10 MG TABS (ESCITALOPRAM OXALATE) One tablet by mouth daily for  anxiety  #30 x 5   Entered  and Authorized by:   Lehman Prom FNP   Signed by:   Lehman Prom FNP on 06/01/2009   Method used:   Electronically to        [facility] [phone]* (retail)       [address]       [place], [place]  [address]       Ph: [phone]       Fax: [phone]   RxID:   [id number]   Laboratory Results   Urine Tests  Date/Time Received: June 01, 2009 11:23 AM   Date/Time Reported: June 01, 2009 11:23 AM   Routine Urinalysis   Color: brown Appearance: Clear Glucose: negative   (Normal Range: Negative) Bilirubin: negative   (Normal Range: Negative) Ketone: negative   (Normal Range: Negative) Spec. Gravity: 1.025   (Normal Range: 1.003-1.035) Blood: negative   (Normal Range: Negative) pH: 5.0   (Normal Range: 5.0-8.0) Protein: 30   (Normal Range: Negative) Urobilinogen: 0.2   (Normal Range: 0-1) Nitrite: negative   (Normal Range: Negative) Leukocyte Esterace: negative   (Normal Range: Negative)    Date/Time Received: June 01, 2009 12:21 PM   [place]/KOH Source: vaginal WBC/hpf: 1-5 Bacteria/hpf: rare Clue cells/hpf: none Yeast/hpf: none Trichomonas/hpf: none  Other Tests  Rapid HIV: negative  Stool - Occult Blood Hemmoccult #1: negative Date: 06/01/2009    Prevention & Chronic Care Immunizations   Influenza vaccine: Fluvax 3+  (04/29/2009)    Tetanus booster: 11/18/2000: Done.    Pneumococcal vaccine: Not documented  Other Screening   Pap smear: normal  (04/02/2007)   Pap smear action/deferral: Ordered  (06/01/2009)    Mammogram: Not documented   Mammogram action/deferral: Ordered  (06/01/2009)   Smoking status: current  (06/01/2009)   Smoking cessation counseling: yes  (06/01/2009)  Lipids   Total Cholesterol: Not documented   Lipid panel action/deferral: Lipid Panel ordered   LDL: Not documented   LDL Direct: Not documented   HDL: Not documented   Triglycerides: Not  documented   Laboratory Results   Urine Tests    Routine Urinalysis   Color: brown Appearance: Clear Glucose: negative   (Normal Range: Negative) Bilirubin: negative   (Normal Range: Negative) Ketone: negative   (Normal Range: Negative) Spec. Gravity: 1.025   (Normal Range: 1.003-1.035) Blood: negative   (Normal Range: Negative) pH: 5.0   (Normal Range: 5.0-8.0) Protein: 30   (Normal Range: Negative) Urobilinogen: 0.2   (Normal Range: 0-1) Nitrite: negative   (Normal Range: Negative) Leukocyte Esterace: negative   (Normal Range: Negative)    Date/Time Received: June 01, 2009 12:59 PM   [employer] Source: vaginal WBC/hpf: 1-5 Bacteria/hpf: rare Clue cells/hpf: none Yeast/hpf: none [place] KOH: Negative Trichomonas/hpf: none  Other Tests  Rapid HIV: negative  Stool - Occult Blood Hemmoccult #1: negative Date: 06/01/2009

## 2010-07-20 NOTE — Letter (Signed)
Summary: [facility]   Imported By: Arta Bruce 11/30/2009 15:10:38  _____________________________________________________________________  External Attachment:    Type:   Image     Comment:   External Document

## 2010-07-20 NOTE — Progress Notes (Signed)
Summary: Refill  Phone Note Call from Patient Call back at [facility] Phone [phone]   Summary of Call: Pt needs refill on percocet. Initial call taken by: Haydee Salter,  May 28, 2007 12:13 PM  Follow-up for Phone Call        will forward message to MD Follow-up by: Theresia Lo RN,  May 28, 2007 12:16 PM  Additional Follow-up for Phone Call Additional follow up Details #1::        Pt is checking status.  Pt would prefer vicodin and is wanting the rx before the weekend. Please page the dr to get this done because pt called 5 days ago for this. Additional Follow-up by: Haydee Salter,  June 01, 2007 9:41 AM    Additional Follow-up for Phone Call Additional follow up Details #2::    paged Dr. Burnadette Pop and he states he would like for pt to come in for appointment to discuss pain medication. pt notified and appointment is scheduled 06/05/07. Follow-up by: Theresia Lo RN,  June 01, 2007 10:33 AM

## 2010-07-20 NOTE — Assessment & Plan Note (Signed)
Summary: dnka/ts

## 2010-07-20 NOTE — Letter (Signed)
Summary: Generic Letter  [facility]  [address]   [place], [place] [address]   Phone: [phone]  Fax: [phone]    01/04/2007  [facility] [address] [place], [place]  [address]  Dear Cindy Baker,  Your recent blood and urine lab tests were completely normal further supporting the evidence that your chronic pain is not caused by rheumatoid arthritis or other autoimmune disorders, so it is likely a chronic arthritis vs fibromyalgia picture.  Sincerely,   Angeline Slim MD [facility]  Appended Document: Generic Letter mailed letter to pt

## 2010-07-20 NOTE — Assessment & Plan Note (Signed)
Summary: NEW - Establish Care   Vital Signs:  Patient profile:   45 year old female LMP:     03/2009 Height:      64.25 inches Weight:      187.4 pounds BMI:     32.03 BSA:     1.91 Temp:     97.5 degrees F oral Pulse rate:   69 / minute Pulse rhythm:   regular Resp:     16 per minute BP sitting:   141 / 92  (left arm) Cuff size:   regular  Vitals Entered By: Levon Hedger (April 29, 2009 11:28 AM) CC: new establish chronic health issues x past 3 years...needs mammogram, Depression, Abdominal Pain Is Patient Diabetic? No Pain Assessment Patient in pain? yes     Location: hips, hands, ankles Intensity: 6 Onset of pain  Constant  Does patient need assistance? Functional Status Self care Ambulation Normal Comments pt states she is not currently taking any medications LMP (date): 03/2009     Enter LMP: 03/2009 Last PAP Result normal   CC:  new establish chronic health issues x past 3 years...needs mammogram, Depression, and Abdominal Pain.  History of Present Illness:  Pt into the office to establish care. Pt was seen for 12 years at [facility].  Social - Previously employed as a Advertising copywriter at [employer] - increasing, reports that hands are increasing in swelling and pain Also with some bil hip pain which she first noted on her job when she was trying to get up and down the step at work.  Meds - taking ibuprofen for her joint and maalox as needed for stomach.  Psychosocial stress factors include major life changes.  The patient denies that she feels like life is not worth living, denies that she wishes that she were dead, and denies that she has thought about ending her life.  Her current symptoms almost always prevent her from doing her regular activities.        Comments:  Father passed in January 2010.  Increased emotions because she used to have Thanksgiving at her father's house.  Dyspepsia History:      She has no  alarm features of dyspepsia including no history of melena, hematochezia, dysphagia, persistent vomiting, or involuntary weight loss > 5%.  There is a prior history of GERD.  The patient does not have a prior history of documented ulcer disease.  The dominant symptom is heartburn or acid reflux.  An H-2 blocker medication is currently being taken.  She has no history of a positive H. Pylori serology.  No previous upper endoscopy has been done.      Habits & Providers  Alcohol-Tobacco-Diet     Alcohol drinks/day: <1     Alcohol Counseling: not indicated; use of alcohol is not excessive or problematic     Alcohol type: wine     Tobacco Status: current     Tobacco Counseling: to quit use of tobacco products     Cigarette Packs/Day: 0.5     Year Started: age 85  Exercise-Depression-Behavior     Does Patient Exercise: no     Drug Use: past     Drug Use Counseling: cocaine and pills  Comments: PHQ-9 score = 11  Allergies (verified): 1)  ! Neomycin  Social History: Undergoing divorce; 2 children, lives in [place] with boyfriend, 1ppd x 15 yrs10/2003 and restarted; hx of alcoholism and benzo and crack useaddiction; not exercising.  Former  softball player.Does Patient Exercise:  no Packs/Day:  0.5 Drug Use:  past  Review of Systems General:  Complains of sleep disorder. CV:  Complains of fatigue. Resp:  Denies cough. GI:  Complains of abdominal pain and diarrhea; hx of IBS. MS:  Complains of joint pain; ? positive ANA in the past. hands are swelling (rings on during exam) bilateral hip pain. Psych:  Complains of anxiety; "I worry and stress about everything".  Physical Exam  General:  alert.   Head:  normocephalic.   Ears:  ear piercing(s) noted.   Lungs:  normal breath sounds.   Heart:  normal rate and regular rhythm.   Abdomen:  normal bowel sounds.   Msk:  up to the exam table Neurologic:  alert & oriented X3.     Impression & Recommendations:  Problem # 1:   GENERALIZED ANXIETY DISORDER (ICD-300.02)  The following medications were removed from the medication list:    Amitriptyline Hcl 50 Mg Tabs (Amitriptyline hcl) ..... One tablet by mouth at bedtime Her updated medication list for this problem includes:    Trazodone Hcl 50 Mg Tabs (Trazodone hcl) .Marland Kitchen... 1-2 tablets by mouth nightly as needed for sleep    Lexapro 10 Mg Tabs (Escitalopram oxalate) ..... One tablet by mouth daily for anxiety  Problem # 2:  TOBACCO DEPENDENCE (ICD-305.1) advised cessation  Problem # 3:  PAIN IN JOINT, MULTIPLE SITES (ICD-719.49) will give anti-inflammatories  Problem # 4:  ELEVATED BLOOD PRESSURE (ICD-796.2) will continue to monitor  Problem # 5:  DEPRESSION, MAJOR (ICD-296.20) PHQ- 9 score = 11  Problem # 6:  NEED PROPHYLACTIC VACCINATION&INOCULATION FLU (ICD-V04.81) given today  Complete Medication List: 1)  Ranitidine Hcl 150 Mg Tabs (Ranitidine hcl) .... Take 1 tablet daily at bedtime for reflux 2)  Diclofenac Sodium 75 Mg Tbec (Diclofenac sodium) .... One tablet by mouth two times a day for joints 3)  Trazodone Hcl 50 Mg Tabs (Trazodone hcl) .Marland Kitchen.. 1-2 tablets by mouth nightly as needed for sleep 4)  Lexapro 10 Mg Tabs (Escitalopram oxalate) .... One tablet by mouth daily for anxiety  Other Orders: Flu Vaccine 38yrs + (32355) Admin 1st Vaccine (73220) Admin 1st Vaccine [facility]) [phone])  Dyspepsia Assessment/Plan:  Step Therapy: GERD Treatment Protocols:    Step-1: started    H-2 blocker chosen: Ranitidine 150mg  by mouth at bedtime  Patient Instructions: 1)  Schedule an appointment for complete physical exam in 1 month 2)  You will need to be fasting for lab - lipids, cmp, tsh, cbc, hiv, vitamin D, sed rate, RF, c-reative protein, ANA, U/a 3)  You will need PAP, mammogram, labs. 4)  consider referral with [facility] 5)  Joints - take the diclofenac 75mg  by mouth two times a day (take with food) 6)  Anxiety - start lexapro 10mg  in the  afternoon. This is helpful for your mood and anxiety. It does that some weeks to build up in your system but I want you to take it every evening. 7)  Stomach - IBS: avoid the foods that make this worse. 8)  Start ranatidine 150mg  by mouth nighty before bed. This will help with the indigestion 9)  Sleep issues - May take trazodone 1-2 tablets by mouth nightly as needed for sleep Prescriptions: RANITIDINE HCL 150 MG TABS (RANITIDINE HCL) Take 1 tablet daily at bedtime for reflux  #30 x 1   Entered and Authorized by:   Lehman Prom FNP   Signed by:   Lehman Prom FNP  on 04/29/2009   Method used:   Print then Give to Patient   RxID:   [id number] LEXAPRO 10 MG TABS (ESCITALOPRAM OXALATE) One tablet by mouth daily for anxiety  #28 x 0   Entered and Authorized by:   Lehman Prom FNP   Signed by:   Lehman Prom FNP on 04/29/2009   Method used:   Samples Given   RxID:   [id number] TRAZODONE HCL 50 MG TABS (TRAZODONE HCL) 1-2 tablets by mouth nightly as needed for sleep  #60 x 0   Entered and Authorized by:   Lehman Prom FNP   Signed by:   Lehman Prom FNP on 04/29/2009   Method used:   Print then Give to Patient   RxID:   [id number] DICLOFENAC SODIUM 75 MG TBEC (DICLOFENAC SODIUM) One tablet by mouth two times a day for joints  #60 x 5   Entered and Authorized by:   Lehman Prom FNP   Signed by:   Lehman Prom FNP on 04/29/2009   Method used:   Print then Give to Patient   RxID:   [id number] RANITIDINE HCL 150 MG TABS (RANITIDINE HCL) Take 1 tablet daily at bedtime for reflux  #30 x 1   Entered and Authorized by:   Lehman Prom FNP   Signed by:   Lehman Prom FNP on 04/29/2009   Method used:   Electronically to        [facility] [address] (retail)       [phone]       [place], [place]  [address]       Ph: [phone]       Fax: [phone]   RxID:   [id number]    Influenza Vaccine     Vaccine Type: Fluvax 3+    Site: left deltoid    Mfr: Sanofi Pasteur    Dose: 0.5 ml    Route: IM    Given by: Levon Hedger    Exp. Date: 12/17/2009    Lot #: [id number]    VIS given: 01/11/07 version given April 29, 2009.  Flu Vaccine Consent Questions    Do you have a history of severe allergic reactions to this vaccine? no    Any prior history of allergic reactions to egg and/or gelatin? no    Do you have a sensitivity to the preservative Thimersol? no    Do you have a past history of Guillan-Barre Syndrome? no    Do you currently have an acute febrile illness? no    Have you ever had a severe reaction to latex? no    Vaccine information given and explained to patient? yes    Are you currently pregnant? no    ndc  [phone]   X-ray  Procedure date:  04/16/2009  Findings:      right hand - no acute fracture  X-ray  Procedure date:  04/16/2009  Findings:      right foot - stable 7mm degenerative inferior calcaneal spur no interval acute findings   X-ray  Procedure date:  04/16/2009  Findings:      right hand - no acute fracture  X-ray  Procedure date:  04/16/2009  Findings:      right foot - stable 7mm degenerative inferior calcaneal spur no interval acute findings

## 2010-07-20 NOTE — Progress Notes (Signed)
Summary: Office Visit/DEPRESSION SCREENING  Office Visit/DEPRESSION SCREENING   Imported By: Arta Bruce 06/01/2009 12:27:37  _____________________________________________________________________  External Attachment:    Type:   Image     Comment:   External Document

## 2010-07-20 NOTE — Progress Notes (Signed)
Summary: [place] request  Phone Note Call from Patient   Summary of Call: Patient states she is wanting to be seen for possible MRSA - her youngest daughter has it and now her and her oldest daughter have bumps appearing that seem to be like MRSA. Pt agreed to come in at 3:30pm today. Initial call taken by: Haydee Salter,  February 02, 2007 8:38 AM

## 2010-07-20 NOTE — Letter (Signed)
Summary: *Referral Letter  [facility]  [address] [place], [place] [address]   Phone: [phone]  Fax: [phone]    06/17/2009  Thank you in advance for agreeing to see my patient:  Cindy Baker [address] [place], [place]  [address]  Phone: [phone]  Reason for Referral: +ANA, multiple joint pain  Procedures Requested: Assessment and treatment  Current Medical Problems: 1)  ANA POSITIVE (ICD-790.99) 2)  HYPERGLYCEMIA (ICD-790.29) 3)  BACTERIAL VAGINITIS (ICD-616.10) 4)  HYPERCHOLESTEROLEMIA (ICD-272.0) 5)  UNSPECIFIED VITAMIN D DEFICIENCY (ICD-268.9) 6)  INCONTINENCE, URGE (ICD-788.31) 7)  SEXUALLY TRANSMITTED DISEASE, EXPOSURE TO (ICD-V01.6) 8)  UNSPECIFIED BREAST SCREENING (ICD-V76.10) 9)  ROUTINE GYNECOLOGICAL EXAMINATION (ICD-V72.31) 10)  NEED PROPHYLACTIC VACCINATION&INOCULATION FLU (ICD-V04.81) 11)  ELEVATED BLOOD PRESSURE (ICD-796.2) 12)  GERD (ICD-530.81) 13)  TREMOR (ICD-781.0) 14)  GENERALIZED ANXIETY DISORDER (ICD-300.02) 15)  DEPRESSION, MAJOR (ICD-296.20) 16)  BREAST CANCER, FAMILY HX (ICD-V16.3) 17)  EXPOSURE TO VIRAL DISEASE, NEC (ICD-V01.79) 18)  RHINITIS, ALLERGIC NOS (ICD-477.9) 19)  PAIN IN JOINT, MULTIPLE SITES (ICD-719.49) 20)  FATIGUE (ICD-780.79) 21)  TOBACCO DEPENDENCE (ICD-305.1) 22)  IRRITABLE BOWEL SYNDROME (ICD-564.1) 23)  ENDOMETRIOSIS (ICD-617.9)   Current Medications: 1)  RANITIDINE HCL 150 MG TABS (RANITIDINE HCL) Take 1 tablet daily at bedtime for reflux 2)  DICLOFENAC SODIUM 75 MG TBEC (DICLOFENAC SODIUM) One tablet by mouth two times a day for joints 3)  TRAZODONE HCL 50 MG TABS (TRAZODONE HCL) 1-2 tablets by mouth nightly as needed for sleep 4)  LEXAPRO 10 MG TABS (ESCITALOPRAM OXALATE) One tablet by mouth daily for anxiety 5)  CHANTIX STARTING MONTH PAK 0.5 MG X 11 & 1 MG X 42 TABS (VARENICLINE TARTRATE) Take according to starter instructions 6)  METRONIDAZOLE 500 MG TABS (METRONIDAZOLE) One  tablet by mouth two times a day for infection 7)  GEMFIBROZIL 600 MG TABS (GEMFIBROZIL) One tablet by mouth two times a day 30 minutes with food 8)  ERGOCALCIFEROL 50000 UNIT CAPS (ERGOCALCIFEROL) One capsule WEEKLY for bones    Thank you again for agreeing to see our patient; please contact us if you have any further questions or need additional information.  Sincerely,    Lehman Prom FNP [facility]

## 2010-07-20 NOTE — Progress Notes (Signed)
Summary: Fibromyalgia/FYI/TS  Phone Note Call from Patient Call back at [phone]   Summary of Call: Pt is requesting to speak with Dr. Leveda Anna, she states her cousin sees him and she has too once, about fibromyalgia.  She states she doesn't think her MD believes in fibromyalgia. Initial call taken by: Haydee Salter,  September 14, 2007 2:37 PM  Follow-up for Phone Call        CALLED PT AND LMVM TO SCHED. APPT WITH DR.LINTHAVONG, THIS IS HER PCP. (Dr.Hensel is not pt's PCP. last OV pt dnka. pt needs to discuss issues with her PCP) Follow-up by: Arlyss Repress CMA,,  September 17, 2007 8:49 AM

## 2010-07-20 NOTE — Assessment & Plan Note (Signed)
Summary: pain wk   Vital Signs:  Patient Profile:   45 Years Old Female Weight:      195 pounds Temp:     98.3 degrees F oral Pulse rate:   88 / minute BP sitting:   131 / 84  (left arm)  Pt. in pain?   yes    Location:   BODY    Intensity:   8  Vitals Entered By: Arlyss Repress CMA, (November 20, 2006 3:42 PM)                Chief Complaint:  Body aches.  History of Present Illness: Cindy Baker is here c/o of persistent body aches which she has had for several years. she has been to physical therapy and sports medicine clinic in the past without significant relief from her discomfort.  She has ben taking half of hydrocodone/apap 5/325 1-2 times a day. pt report stiffness in morning and pain throughout the day.  she also c/o persistent tightness of leg muscles. There is a history of RA and Lupus in her immediate family. pt also c/o swelling in ankles and feet. and reports being tired often.    Past Medical History:    Reviewed history from 08/17/2006 and no changes required:       Chronic R knee pain s/p injury, [id number]  (NSVD x 2), h/o depression  Past Surgical History:    Reviewed history from 08/17/2006 and no changes required:       04/19/06 ldl: 130 hdl: 46 tri: 281 - 04/28/2006, BTL - 06/20/1994, R tympanic membrane rupture age 25 - 06/20/1970   Family History:    Breast CA- granmother, DM-sister, grandmother, HTN-Dad, Lung CA-granfather, sister with lupus  Social History:    Married x 63yrs; 2 children (16yo daughter, 6yo daughter); lives in [place] with husband and Immunologist; works in housekeeping;; 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 No apparent alopecia or balding. Eyes:     No corneal or conjunctival  inflammation noted. Vision grossly normal. Chest Wall:     chest wall tenderness.   Msk:     tenderness to palpation in calves, back of knees, upper and lower back and occiput. no obvious deformities seen. unable to appreciate swelling that pt reports. she walks with an antalgic gait Pulses:     R and L carotid,radial,femoral,dorsalis pedis and posterior tibial pulses are full and equal bilaterally Extremities:     No clubbing, cyanosis, edema, or deformity noted  Neurologic:     No cranial nerve deficits noted. Sensory, motor and coordinative functions appear intact. Psych:     Oriented X3 and moderately anxious.  became tearful during exam.    Impression & Recommendations:  Problem # 1:  PAIN IN JOINT, MULTIPLE SITES (ICD-719.49) Assessment: Deteriorated Think all pts symtoms could be related. will check for rheum causes. concern for autoimmune disorder given family history.  if test are negative, will likely be given dx of fibromyalgia. given pt h/o of depression, she may benefit from antidepressnts for relief of pain and/or lyrica Orders: Sed Rate (ESR)-[facility] [phone]) Rheum Fact-[facility] ([address]) ANA-[facility] ([id number]) [facility]- Est  Level 4 (02725)   Problem # 2:  MUSCLE CRAMPS (ICD-729.82) Assessment: New check labs. likely due to issues in #1  Orders: Comp Met-[facility] [id number]) B12-[facility] (09811-91478) [facility]- Est  Level 4 (29562)   Problem # 3:  FATIGUE (ICD-780.79) Assessment: New check TSH and CBC. think is related to other issues. Orders: CBC-[facility] ([address]) TSH-[facility] ([id number]) [facility]- Est  Level 4 (28413)

## 2010-07-20 NOTE — Progress Notes (Signed)
Summary: Need F/u appt to discuss labs  Phone Note Outgoing Call   Summary of Call: Notify pt that I would like Cindy Baker to come back into the office to discuss Cindy Baker lab results. no cause for alarm I just want to be sure she understands and want to Cindy Baker to make an appt to review **See flag regarding some labs to add to blood in lab** Initial call taken by: Lehman Prom FNP,  June 04, 2009 10:13 AM  Follow-up for Phone Call        Cindy Baker  June 04, 2009 11:40 AM called [phone] we are sorry your call did not go through.  Will mail letter. Follow-up by: Cindy Baker,  June 04, 2009 11:40 AM

## 2010-07-20 NOTE — Letter (Signed)
Summary: MAILED REQUESTED RECORDS TO [facility]  MAILED REQUESTED RECORDS TO [facility]   Imported By: Arta Bruce 12/25/2009 12:37:46  _____________________________________________________________________  External Attachment:    Type:   Image     Comment:   External Document

## 2010-07-20 NOTE — Letter (Signed)
Summary: Generic Letter  [facility]  [address] [place], [place] [address]   Phone: [phone]  Fax: [phone]    09/10/2009  Cindy Baker [address] [place], [place]  [address]  To whom it may concern:  [name] is an established patient in this office.  It has been suggested that she participate in water aerobics.  This will help her mental and physical illnesses.  The water aerobics will prove to be beneficial for pain in her joints and allow her to exercise more easily. I am strongly recommended that she be enrolled in your water aerobics program.  Feel free to call this office with questions.    Sincerely,    Lehman Prom FNP [facility]

## 2010-07-20 NOTE — Letter (Signed)
Summary: [facility]   Imported By: Arta Bruce 10/30/2009 15:54:15  _____________________________________________________________________  External Attachment:    Type:   Image     Comment:   External Document

## 2010-07-20 NOTE — Miscellaneous (Signed)
Summary: DDS  DDS   Imported By: Knox Royalty 03/05/2008 10:13:41  _____________________________________________________________________  External Attachment:    Type:   Image     Comment:   External Document

## 2010-07-20 NOTE — Progress Notes (Signed)
Summary: [place] request  Phone Note Call from Patient Call back at [phone]   Summary of Call: pt states she has a broken finger and is wanting to see if she will need surgery  Initial call taken by: Haydee Salter,  October 17, 2006 11:46 AM  Follow-up for Phone Call        left message Follow-up by: Golden Circle RN,  October 17, 2006 12:12 PM  Additional Follow-up for Phone Call Additional follow up Details #1::        left message Additional Follow-up by: Golden Circle RN,  October 17, 2006 1:50 PM

## 2010-07-20 NOTE — Assessment & Plan Note (Signed)
Summary: Fibromyalgia   Vital Signs:  Patient profile:   45 year old female Weight:      176.9 pounds BMI:     30.24 Temp:     98.3 degrees F oral Pulse rate:   96 / minute Pulse rhythm:   regular Resp:     20 per minute BP sitting:   124 / 84  (left arm) Cuff size:   regular  Vitals Entered By: Levon Hedger (February 05, 2010 2:12 PM)  Nutrition Counseling: Patient's BMI is greater than 25 and therefore counseled on weight management options. CC: renew medication...wants to talk about her right side it feels funny, Depression, Lipid Management, Abdominal Pain Is Patient Diabetic? No Pain Assessment Patient in pain? yes     Location: joints Intensity: 6 Type: aching  Does patient need assistance? Functional Status Self care Ambulation Normal   Primary Care Caeson Filippi:  Lehman Prom FNP  CC:  renew medication...wants to talk about her right side it feels funny, Depression, Lipid Management, and Abdominal Pain.  History of Present Illness:  Pt into the office for review of medications  Pt did NOT bring her medications into the office with her. The patient denies that she feels like life is not worth living, denies that she wishes that she were dead, and denies that she has thought about ending her life.        Comments:  Pt is going to therapy every Monday and pt is still trying to sort through some issues.  Dr. Jonny Ruiz Poag .  Depression Treatment History:  Prior Medication Used:   Start Date: Assessment of Effect:   Comments:  lexapro     05/08/2009   started     improved at f/u visit  Dyspepsia History:      She has no alarm features of dyspepsia including no history of melena, hematochezia, dysphagia, persistent vomiting, or involuntary weight loss > 5%.  There is a prior history of GERD.  The patient does not have a prior history of documented ulcer disease.  The dominant symptom is not heartburn or acid reflux.  An H-2 blocker medication is currently being  taken.  She notes that the symptoms have not improved with the H-2 blocker therapy.  Symptoms have not persisted after 4 weeks of H-2 blocker treatment.  She has no history of a positive H. Pylori serology.  No previous upper endoscopy has been done.    Lipid Management History:      Positive NCEP/ATP III risk factors include current tobacco user.  Negative NCEP/ATP III risk factors include female age less than 68 years old, no history of early menopause without estrogen hormone replacement, non-diabetic, non-hypertensive, no ASHD (atherosclerotic heart disease), no prior stroke/TIA, no peripheral vascular disease, and no history of aortic aneurysm.        The patient states that she knows about the "Therapeutic Lifestyle Change" diet.  Her compliance with the TLC diet is fair.  The patient does not know about adjunctive measures for cholesterol lowering.  She expresses no side effects from her lipid-lowering medication.  The patient denies any symptoms to suggest myopathy or liver disease.       Habits & Providers  Alcohol-Tobacco-Diet     Alcohol drinks/day: <1     Alcohol Counseling: not indicated; use of alcohol is not excessive or problematic     Alcohol type: wine     Tobacco Status: current     Tobacco Counseling: to quit use of tobacco  products     Cigarette Packs/Day: 0.5     Year Started: age 69  Exercise-Depression-Behavior     Does Patient Exercise: no     Depression Counseling: further diagnostic testing and/or other treatment is indicated     Drug Use: past  Comments: Pt did not start water aerobics as indicated during last visit due to funds  Current Medications (verified): 1)  Diclofenac Sodium 75 Mg Tbec (Diclofenac Sodium) .... One Tablet By Mouth Two Times A Day For Joints 2)  Trazodone Hcl 50 Mg Tabs (Trazodone Hcl) .Marland Kitchen.. 1-2 Tablets By Mouth Nightly As Needed For Sleep 3)  Lexapro 10 Mg Tabs (Escitalopram Oxalate) .... One Tablet By Mouth Daily For Anxiety 4)   Gemfibrozil 600 Mg Tabs (Gemfibrozil) .... One Tablet By Mouth Two Times A Day 30 Minutes With Food 5)  Cymbalta 30 Mg Cpep (Duloxetine Hcl) .... One Tablet By Mouth Daily 6)  Vitamin D 1000 Unit Tabs (Cholecalciferol) .... One Tablet By Mouth Two Times A Day 7)  Ranitidine Hcl 150 Mg Tabs (Ranitidine Hcl) .... Take One Tablet By Mouth Once Daily At  Bedtime For Reflux  Allergies (verified): 1)  ! Neomycin  Review of Systems General:  Complains of sleep disorder; Trazodone is not effective for rest. She is not able to fall asleep and when she does she does not stay asleep any longer then 2-3 hours.   Previously she took some OTC Meds.. CV:  Denies chest pain or discomfort. Resp:  Denies cough. GI:  Denies abdominal pain, nausea, and vomiting. MS:  Complains of joint pain and muscle. Allergy:  Complains of seasonal allergies and sneezing; pt has been taking OTC meds but that has not been effective.  Physical Exam  General:  alert.   Head:  normocephalic.   Nose:  no nasal discharge.   Lungs:  normal breath sounds.   Heart:  normal rate and regular rhythm.   Abdomen:  normal bowel sounds.   Msk:  up to the exam table Neurologic:  alert & oriented X3.   gait - stooped gait   Impression & Recommendations:  Problem # 1:  FIBROMYALGIA (ICD-729.1) stable  advised pt to keep active and to get good sleep Pt sees a rheumatology and has a appt next week Her updated medication list for this problem includes:    Diclofenac Sodium 75 Mg Tbec (Diclofenac sodium) ..... One tablet by mouth two times a day for joints  Problem # 2:  HYPERCHOLESTEROLEMIA (ICD-272.0) pt is taking meds as ordered Her updated medication list for this problem includes:    Gemfibrozil 600 Mg Tabs (Gemfibrozil) ..... One tablet by mouth two times a day 30 minutes with food  Problem # 3:  ELEVATED BLOOD PRESSURE (ICD-796.2) Bp is elevated - no current meds  Problem # 4:  TOBACCO DEPENDENCE (ICD-305.1) advised  cessation  Problem # 5:  RHINITIS, ALLERGIC NOS (ICD-477.9) will change to zyrtec D  Problem # 6:  INSOMNIA (ICD-780.52) will change meds to amitriptyline  Complete Medication List: 1)  Diclofenac Sodium 75 Mg Tbec (Diclofenac sodium) .... One tablet by mouth two times a day for joints 2)  Lexapro 10 Mg Tabs (Escitalopram oxalate) .... One tablet by mouth daily for anxiety 3)  Gemfibrozil 600 Mg Tabs (Gemfibrozil) .... One tablet by mouth two times a day 30 minutes with food 4)  Cymbalta 30 Mg Cpep (Duloxetine hcl) .... One tablet by mouth daily 5)  Ranitidine Hcl 150 Mg Tabs (Ranitidine hcl) .Marland KitchenMarland KitchenMarland Kitchen  Take one tablet by mouth once daily at  bedtime for reflux 6)  Zyrtec-d Allergy & Congestion 5-120 Mg Xr12h-tab (Cetirizine-pseudoephedrine) .... One tablet by mouth two times a day as needed for allergies 7)  Amitriptyline Hcl 25 Mg Tabs (Amitriptyline hcl) .... One tablet by mouth nightly for rest  Dyspepsia Assessment/Plan:  Step Therapy: GERD Treatment Protocols:    Step-1: improved  Lipid Assessment/Plan:      Based on NCEP/ATP III, the patient's risk factor category is "0-1 risk factors".  The patient's lipid goals are as follows: Total cholesterol goal is 200; LDL cholesterol goal is 160; HDL cholesterol goal is 40; Triglyceride goal is 150.  Her LDL cholesterol goal has not been met.  She has been counseled on adjunctive measures for lowering her cholesterol and has been provided with dietary instructions.    Patient Instructions: 1)  Sleep - medications will be changed to amitriptyline 2)  Allergies - Will start zyrtec D, will see if your medicaid will cover 3)  All your prescriptions have been sent to [facility] 4)  Keep your appointment at Rheumatology 5)  Follow up in December for your complete physical exam 6)  come fasting before this appointment for labs Prescriptions: AMITRIPTYLINE HCL 25 MG TABS (AMITRIPTYLINE HCL) One tablet by mouth nightly for rest  #30 x 3   Entered and  Authorized by:   Lehman Prom FNP   Signed by:   Lehman Prom FNP on 02/05/2010   Method used:   Electronically to        [facility]* (retail)       [address]       [place], [place]  [address]       Ph: [phone]       Fax: [phone]   RxID:   [id number] ZYRTEC-D ALLERGY & CONGESTION 5-120 MG XR12H-TAB (CETIRIZINE-PSEUDOEPHEDRINE) One tablet by mouth two times a day as needed for allergies  #60 x 3   Entered and Authorized by:   Lehman Prom FNP   Signed by:   Lehman Prom FNP on 02/05/2010   Method used:   Electronically to        [facility] (retail)       [address]       [place], [place]  [id number]       Ph: [phone]       Fax: [phone]   RxID:   [id number] RANITIDINE HCL 150 MG TABS (RANITIDINE HCL) Take one tablet by mouth once daily at  bedtime for reflux  #30 x 5   Entered and Authorized by:   Lehman Prom FNP   Signed by:   Lehman Prom FNP on 02/05/2010   Method used:   Electronically to        [facility] (retail)       [address]       [place], [place]  [address]       Ph: [phone]       Fax: [phone]   RxID:   [id number] CYMBALTA 30 MG CPEP (DULOXETINE HCL) One tablet by mouth daily  #30 x 5   Entered and Authorized by:   Lehman Prom FNP   Signed by:   Lehman Prom FNP on 02/05/2010   Method used:   Electronically to        [facility] (retail)       [address]  [place], [place]  [address]       Ph: [phone]       Fax: [phone]   RxID:   [id number] GEMFIBROZIL 600 MG TABS (GEMFIBROZIL) One tablet by mouth two times a day 30 minutes with food  #60 x 5   Entered and Authorized by:   Lehman Prom FNP   Signed by:   Lehman Prom FNP on 02/05/2010   Method used:   Electronically to        [facility] (retail)       [address]        [place], [place]  [address]       Ph: [phone]       Fax: [phone]   RxID:   [id number] LEXAPRO 10 MG TABS (ESCITALOPRAM OXALATE) One tablet by mouth daily for anxiety  #30 x 5   Entered and Authorized by:   Lehman Prom FNP   Signed by:   Lehman Prom FNP on 02/05/2010   Method used:   Electronically to        [facility] (retail)       [address]       [place], [place]  [address]       Ph: [phone]       Fax: [phone]   RxID:   [id number] DICLOFENAC SODIUM 75 MG TBEC (DICLOFENAC SODIUM) One tablet by mouth two times a day for joints  #60 x 5   Entered and Authorized by:   Lehman Prom FNP   Signed by:   Lehman Prom FNP on 02/05/2010   Method used:   Electronically to        [facility] (retail)       [address]       [place], [place]  [address]       Ph: [phone]       Fax: [phone]   RxID:   [id number]

## 2010-07-20 NOTE — Consult Note (Signed)
Summary: Psychology examination, Dr. Burlene Arnt  Psychology examination, Dr. Burlene Arnt   Imported By: Haydee Salter 04/16/2008 10:33:40  _____________________________________________________________________  External Attachment:    Type:   Image     Comment:   External Document

## 2010-07-20 NOTE — Miscellaneous (Signed)
Summary: Dx added - Per Rheumatology  Clinical Lists Changes Full visit summary to be scanned in EMR Problems: Changed problem from PAIN IN JOINT, MULTIPLE SITES (ICD-719.49) to FIBROMYALGIA (ICD-729.1) - Rheumatology confirmed dx on 08/03/2009 - Dr. Orlin Hilding

## 2010-07-20 NOTE — Letter (Signed)
Summary: TEST ORDER FORM//MAMMOGRAM//APPT DATE & TIME  TEST ORDER FORM//MAMMOGRAM//APPT DATE & TIME   Imported By: Arta Bruce 07/17/2009 11:06:27  _____________________________________________________________________  External Attachment:    Type:   Image     Comment:   External Document

## 2010-07-20 NOTE — Miscellaneous (Signed)
  Clinical Lists Changes  Observations: Added new observation of PAP SMEAR: normal (04/02/2007 9:18)       Preventive Care Screening  Pap Smear:    Date:  04/02/2007    Results:  normal

## 2010-07-20 NOTE — Assessment & Plan Note (Signed)
Summary: fibromyalgia, tobacco   Vital Signs:  Patient Profile:   45 Years Old Female Weight:      195 pounds Pulse rate:   75 / minute BP sitting:   129 / 84  Pt. in pain?   yes    Location:   joints    Intensity:   6  Vitals Entered By: Arlyss Repress CMA, (December 07, 2006 3:45 PM)              Is Patient Diabetic? No   Chief Complaint:  f/u labs and smoking.  History of Present Illness: pt continues to have generalized pain in joints and muscles. all over body. she often feels like her muscles are tense.  Labs done at last visit were negative for rheumatologic or inflammatory processes.  Pt is here for follow up.  Tobacco- pt wants to stop smoking. she smokes 1/2 ppd.  has fhx of nsc lung cancer as well as breast canser and thinks quitting will minimize her risk of these.    Past Medical History:    Reviewed history from 08/17/2006 and no changes required:       Chronic R knee pain s/p injury, [id number]  (NSVD x 2), h/o depression    Risk Factors:  Tobacco use:  current    Physical Exam  General:     Well-developed,well-nourished,in no acute distress; alert,appropriate and cooperative throughout examination Psych:     Oriented X3 and moderately anxious.     Impression & Recommendations:  Problem # 1:  FIBROMYALGIA (ICD-729.1) Assessment: New given negative workup and persistent pain, this is a presumptive diagnosis. pt was not interested in starting Lyrica (the only FDA approved medication for this), she was not opposed to trying antidepressant and muscle relaxer which both have good evidence for being effective.  the antidepressant I believe is a good choice given pts h/o depression although denies symtoms at this time.  rx for amitriptyline 25mg  at bedtime and flexeril 10mg  at bedtime. both of these may need to be titrated up. 20 minutes spent reviewing labs, explaining diagnosis and discussing treatment. pt to have f/u in one month to see how treatment is  going. Orders: [facility]- Est  Level 4 ([address]409)   Problem # 2:  TOBACCO DEPENDENCE (ICD-305.1) Assessment: Unchanged great that pt wants to quit. has set quit date of 7/15 after holiday and several family get togethers have finihsed.  pt will begin taking Chantix one week prior to that while trying to cut down on cigarrette use. Chantix starter pack rx sent to pharmacy. Orders: [facility]- Est  Level 4 (81191)

## 2010-07-20 NOTE — Consult Note (Signed)
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

## 2010-07-20 NOTE — Letter (Signed)
Summary: Pain Contract  Pain Contract   Imported By: Denny Peon LEVAN 03/16/2007 09:37:43  _____________________________________________________________________  External Attachment:    Type:   Image     Comment:   External Document

## 2010-07-20 NOTE — Assessment & Plan Note (Signed)
Summary: Lab review   Vital Signs:  Patient profile:   45 year old female Weight:      185.0 pounds BMI:     31.62 BSA:     1.90 Temp:     97.9 degrees F oral Pulse rate:   93 / minute Pulse rhythm:   regular Resp:     20 per minute BP sitting:   137 / 80  (left arm) Cuff size:   regular  Vitals Entered By: Levon Hedger (June 17, 2009 11:47 AM) CC: follow up visit review labs/pt states she has had her period again and it was within 25 days, Lipid Management Is Patient Diabetic? No Pain Assessment Patient in pain? yes       Does patient need assistance? Functional Status Self care Ambulation Normal   CC:  follow up visit review labs/pt states she has had her period again and it was within 25 days and Lipid Management.  History of Present Illness:  Pt into the office for follow up for labs.  CPE done 06/01/2009  Menses - irregular; Twice since her last visit. Menstrual cycles have lengthened and cycles have been heavier. No cramps She also has a recurrent odory discharge No itching. Change in vaginal flora seen on PAP  Glucose - 101 during last labs. Pt did eat already today.  She also drank a frapaccino before this visit. No history of diabetes Denies any polyuria, disuria or polyphagia  Lipid Management History:      Positive NCEP/ATP III risk factors include current tobacco user.  Negative NCEP/ATP III risk factors include female age less than 7 years old, no history of early menopause without estrogen hormone replacement, non-diabetic, non-hypertensive, no ASHD (atherosclerotic heart disease), no prior stroke/TIA, no peripheral vascular disease, and no history of aortic aneurysm.        Comments: lab reviewed from previous visit given increased in triglycerides will start gemfibrozil and not a statin .   Medications Prior to Update: 1)  Ranitidine Hcl 150 Mg Tabs (Ranitidine Hcl) .... Take 1 Tablet Daily At Bedtime For Reflux 2)  Diclofenac Sodium 75  Mg Tbec (Diclofenac Sodium) .... One Tablet By Mouth Two Times A Day For Joints 3)  Trazodone Hcl 50 Mg Tabs (Trazodone Hcl) .Marland Kitchen.. 1-2 Tablets By Mouth Nightly As Needed For Sleep 4)  Lexapro 10 Mg Tabs (Escitalopram Oxalate) .... One Tablet By Mouth Daily For Anxiety 5)  Chantix Starting Month Pak 0.5 Mg X 11 & 1 Mg X 42 Tabs (Varenicline Tartrate) .... Take According To Starter Instructions  Allergies (verified): 1)  ! Neomycin  Review of Systems General:  Complains of fatigue. CV:  Denies chest pain or discomfort. Resp:  Denies cough. MS:  Complains of joint pain and stiffness; multiple joint pain.  Physical Exam  General:  alert.   Head:  normocephalic.   Msk:  joint tenderness.   Neurologic:  abnormal gait Psych:  Oriented X3.     Impression & Recommendations:  Problem # 1:  BACTERIAL VAGINITIS (ICD-616.10) noted on PAP will treat as pt is still symptomatic Her updated medication list for this problem includes:    Metronidazole 500 Mg Tabs (Metronidazole) ..... One tablet by mouth two times a day for infection  Problem # 2:  HYPERGLYCEMIA (ICD-790.29) last 101 noted on labs will recheck on next visit.  Problem # 3:  HYPERCHOLESTEROLEMIA (ICD-272.0) labs reviewed with pt advised pt to restart meds advised pt of the need to change diet Her updated  medication list for this problem includes:    Gemfibrozil 600 Mg Tabs (Gemfibrozil) ..... One tablet by mouth two times a day 30 minutes with food  Problem # 4:  UNSPECIFIED VITAMIN D DEFICIENCY (ICD-268.9) advised pt that she needs a supplement labs reviewed  Problem # 5:  ANA POSITIVE (ICD-790.99)  general joint stiffness ANA positive during last labs will refer to the specialist  Orders: Rheumatology Referral (Rheumatology)  Complete Medication List: 1)  Ranitidine Hcl 150 Mg Tabs (Ranitidine hcl) .... Take 1 tablet daily at bedtime for reflux 2)  Diclofenac Sodium 75 Mg Tbec (Diclofenac sodium) .... One tablet  by mouth two times a day for joints 3)  Trazodone Hcl 50 Mg Tabs (Trazodone hcl) .Marland Kitchen.. 1-2 tablets by mouth nightly as needed for sleep 4)  Lexapro 10 Mg Tabs (Escitalopram oxalate) .... One tablet by mouth daily for anxiety 5)  Chantix Starting Month Pak 0.5 Mg X 11 & 1 Mg X 42 Tabs (Varenicline tartrate) .... Take according to starter instructions 6)  Metronidazole 500 Mg Tabs (Metronidazole) .... One tablet by mouth two times a day for infection 7)  Gemfibrozil 600 Mg Tabs (Gemfibrozil) .... One tablet by mouth two times a day 30 minutes with food 8)  Ergocalciferol 50000 Unit Caps (Ergocalciferol) .... One capsule weekly for bones  Lipid Assessment/Plan:      Based on NCEP/ATP III, the patient's risk factor category is "0-1 risk factors".  The patient's lipid goals are as follows: Total cholesterol goal is 200; LDL cholesterol goal is 160; HDL cholesterol goal is 40; Triglyceride goal is 150.  Her LDL cholesterol goal has not been met.  She has been counseled on adjunctive measures for lowering her cholesterol and has been provided with dietary instructions.    Patient Instructions: 1)  You will be referred tor rheumatology for joint pain. 2)  Vaginal discharge - take metroconazole two times a day x 7 days. 3)  Keep scheduled appointment in February. Will need cbg. 4)  Schedule a lab visit in March 2010 for lipids and vitamin D. Prescriptions: ERGOCALCIFEROL 50000 UNIT CAPS (ERGOCALCIFEROL) One capsule WEEKLY for bones  #4 x 3   Entered and Authorized by:   Lehman Prom FNP   Signed by:   Lehman Prom FNP on 06/17/2009   Method used:   Electronically to        [facility] [phone]* (retail)       [address]       [place], [place]  [address]       Ph: [phone]       Fax: [phone]   RxID:   [id number] GEMFIBROZIL 600 MG TABS (GEMFIBROZIL) One tablet by mouth two times a day 30 minutes with food  #60 x 3   Entered and Authorized by:   Lehman Prom FNP    Signed by:   Lehman Prom FNP on 06/17/2009   Method used:   Electronically to        [facility] [phone]* (retail)       [address]       [place], [place]  [address]       Ph: [phone]       Fax: [phone]   RxID:   [id number] METRONIDAZOLE 500 MG TABS (METRONIDAZOLE) One tablet by mouth two times a day for infection  #14 x 0   Entered and Authorized by:   Lehman Prom FNP   Signed by:   Lehman Prom FNP  on 06/17/2009   Method used:   Electronically to        [facility] [phone]* (retail)       [address]       [place], [place]  [address]       Ph: [phone]       Fax: [phone]   RxID:   [id number]

## 2010-07-20 NOTE — Assessment & Plan Note (Signed)
Summary: MEET NEW DOC/F/U MULTIPLE ISSUES/[facility]  Medications Added AMITRIPTYLINE HCL 25 MG TABS (AMITRIPTYLINE HCL) Take 1 tablet by mouth at bedtime CHANTIX STARTING MONTH PAK 0.5 MG X 11 & 1 MG X 42 MISC (VARENICLINE TARTRATE) Take 1 tablet by mouth as directed FLEXERIL 10 MG TABS (CYCLOBENZAPRINE HCL) Take 1 tablet by mouth at bedtime FLONASE 50 MCG/ACT SUSP (FLUTICASONE PROPIONATE) 2 spray into both nostrils once a day HYDROCODONE-ACETAMINOPHEN 5-325 MG TABS (HYDROCODONE-ACETAMINOPHEN) Take 1 tablet by mouth once a day OMEPRAZOLE 20 MG CPDR (OMEPRAZOLE) Take 1 capsule by mouth once a day NAPROXEN 500 MG TABS (NAPROXEN) one tab twice daily CHANTIX 1 MG TABS (VARENICLINE TARTRATE) One tab by mouth BID LORATADINE 10 MG TABS (LORATADINE) Take 1 tablet by mouth once a day      Allergies Added: ! NEOMYCIN  Vital Signs:  Patient Profile:   45 Years Old Female Weight:      196 pounds Pulse rate:   92 / minute BP sitting:   127 / 88  Pt. in pain?   yes  Vitals Entered By: Arlyss Repress CMA, (January 03, 2007 4:22 PM)              Is Patient Diabetic? No   Chief Complaint:  1.)MEET NEW DR 2.)REFILL CHANTIX 3.)JOINT PAIN 5/10 AND DISCUSS DX OF FIBROMYALGIA. PT ASKED: IS IT PROPER DX?Marland Kitchen  History of Present Illness: 45 y/o WF with:  Chronic pain syndrome/fibromyalgia - This has been improved for her after she was on the flexeril and amytryptyline for about 2 weeks.  She in general has been doing very well.  SHe had a fall this morning, but didn't injure anything. SHe states she has only had a few vicodin since she last saw Dr Fara Olden, and has also tried to take the naproxen sparingly.  She tries to stay active and is entering a water aerobics class.  SMoking - SHe will be quitting tomorrow as a start date that was decided upon several weeks ago.  She has cut down to just a few cigarettes per day.  She has been on chantix, and needs a refill since all she got was a starter pack.    Current  Allergies: ! NEOMYCIN   Social History:    Married x 67yrs; 2 children (20yo daughter, 10yo daughter); lives in [place] with husband and 10yo Passenger transport manager; works in housekeeping at [employer];; 1ppd x 15 yrs10/2003 and restarted; hx of alcoholism and benzo and crack useaddiction; not exercising.  Former Ship broker.   Risk Factors:  Tobacco use:  quit    Year quit:  QUIT DATE IS TOMORROW    Physical Exam  General:     obese white female otherwise normal in no acute distress Lungs:     Normal respiratory effort, chest expands symmetrically. Lungs are clear to auscultation, no crackles or wheezes. Heart:     Normal rate and regular rhythm. S1 and S2 normal without gallop, murmur, click, rub or other extra sounds. Skin:     Intact without suspicious lesions or rashes Psych:     Cognition and judgment appear intact. Alert and cooperative with normal attention span and concentration. No apparent delusions, illusions, hallucinations a little tangential in speaking.  A little anxious and melodramatic.    Impression & Recommendations:  Problem # 1:  FIBROMYALGIA (ICD-729.1) Assessment: Improved Continue below meds including amytripitiline.  Watch vicodin use closely, but it sounds like she is truly trying to avoid taking them.  Her updated medication  list for this problem includes:    Flexeril 10 Mg Tabs (Cyclobenzaprine hcl) .Marland Kitchen... Take 1 tablet by mouth at bedtime    Hydrocodone-acetaminophen 5-325 Mg Tabs (Hydrocodone-acetaminophen) .Marland Kitchen... Take 1 tablet by mouth once a day    Naproxen 500 Mg Tabs (Naproxen) ..... One tab twice daily  Orders: Urinalysis-[facility] (00000) [facility]- Est  Level 4 (16109)   Problem # 2:  TOBACCO DEPENDENCE (ICD-305.1) Assessment: Improved Continue chantix.  Counselled at lengths on smoking cessation strategies.  Counselling on fibromyalgia and tobacco was >20 minutes. Her updated medication list for this problem includes:    Chantix Starting  Month Pak 0.5 Mg X 11 & 1 Mg X 42 Misc (Varenicline tartrate) .Marland Kitchen... Take 1 tablet by mouth as directed    Chantix 1 Mg Tabs (Varenicline tartrate) ..... One tab by mouth bid  Orders: [facility]- Est  Level 4 (60454) Miscellaneous Lab Charge-[facility] (09811)   Medications Added to Medication List This Visit: 1)  Amitriptyline Hcl 25 Mg Tabs (Amitriptyline hcl) .... Take 1 tablet by mouth at bedtime 2)  Chantix Starting Month Pak 0.5 Mg X 11 & 1 Mg X 42 Misc (Varenicline tartrate) .... Take 1 tablet by mouth as directed 3)  Flexeril 10 Mg Tabs (Cyclobenzaprine hcl) .... Take 1 tablet by mouth at bedtime 4)  Flonase 50 Mcg/act Susp (Fluticasone propionate) .... 2 spray into both nostrils once a day 5)  Hydrocodone-acetaminophen 5-325 Mg Tabs (Hydrocodone-acetaminophen) .... Take 1 tablet by mouth once a day 6)  Omeprazole 20 Mg Cpdr (Omeprazole) .... Take 1 capsule by mouth once a day 7)  Naproxen 500 Mg Tabs (Naproxen) .... One tab twice daily 8)  Chantix 1 Mg Tabs (Varenicline tartrate) .... One tab by mouth bid 9)  Loratadine 10 Mg Tabs (Loratadine) .... Take 1 tablet by mouth once a day     Prescriptions: CHANTIX 1 MG TABS (VARENICLINE TARTRATE) One tab by mouth BID  #60 x 2   Entered and Authorized by:   Angeline Slim MD   Signed by:   Angeline Slim MD on 01/03/2007   Method used:   Electronically sent to ...       [facility] [address]       [address]       [place], [place]  [address]       Ph: [phone]       Fax: [phone]   RxID:   [id number]      Laboratory Results   Urine Tests  Date/Time Recieved: January 03, 2007 5:35  PM  Date/Time Reported: January 03, 2007 6:08 PM   Routine Urinalysis   Color: yellow Appearance: Cloudy Glucose: negative   (Normal Range: Negative) Bilirubin: negative   (Normal Range: Negative) Ketone: negative   (Normal Range: Negative) Spec. Gravity: 1.025   (Normal Range: 1.003-1.035) Blood:  negative   (Normal Range: Negative) pH: 6.0   (Normal Range: 5.0-8.0) Protein: negative   (Normal Range: Negative) Urobilinogen: 0.2   (Normal Range: 0-1) Nitrite: positive   (Normal Range: Negative) Leukocyte Esterace: trace   (Normal Range: Negative)  Urine Microscopic WBC/hpf: 1-5 RBC/hpf: 1-5 Bacteria: 3+ rods predominate with some cocci Epithelial: >20 Other: rare clue    Comments: ...............test performed by......Marland KitchenBonnie A. [place], [place] (ASCP)    Will return to clinic for pap smear in late september.  Appended Document: MEET NEW DOC/F/U MULTIPLE ISSUES/[facility]       Current Allergies: ! NEOMYCIN  Impression & Recommendations:  Problem # 1:  FIBROMYALGIA (ICD-729.1) She has a history of a weakly positive ANA (1:40).  After looking this up in [facility], they felt that chronic joint pain + weakly positive ana was likely nothing to worry about, but occasionally assocaited with sjogren's and RA.  They recommended U/A presumably for proteinuria/hematuria and anti-ro and anti-la  tests, so these were also performed.  Suspect this will be negative.          Appended Document: MEET NEW DOC/F/U MULTIPLE ISSUES/[facility] Urinalysis and Sjogren's syndrome labs completely normal. So we seem to have essentially ruled out an autoimmune cause of her chronic pain, thus it appears to be in the chronic pain/fibromyalgia spectrum.

## 2010-07-20 NOTE — Assessment & Plan Note (Signed)
Summary: DNKA    Current Allergies: ! NEOMYCIN        Complete Medication List: 1)  Amitriptyline Hcl 25 Mg Tabs (Amitriptyline hcl) .... Take 1 tablet by mouth at bedtime 2)  Chantix Starting Month Pak 0.5 Mg X 11 & 1 Mg X 42 Misc (Varenicline tartrate) .... Take 1 tablet by mouth as directed 3)  Flexeril 10 Mg Tabs (Cyclobenzaprine hcl) .... Take 1 tablet by mouth at bedtime 4)  Flonase 50 Mcg/act Susp (Fluticasone propionate) .... 2 spray into both nostrils once a day 5)  Hydrocodone-acetaminophen 5-325 Mg Tabs (Hydrocodone-acetaminophen) .... Take 1 tablet by mouth once a day 6)  Omeprazole 20 Mg Cpdr (Omeprazole) .... Take 1 capsule by mouth once a day 7)  Naproxen 500 Mg Tabs (Naproxen) .... One tab twice daily 8)  Chantix 1 Mg Tabs (Varenicline tartrate) .... One tab by mouth bid 9)  Loratadine 10 Mg Tabs (Loratadine) .... Take 1 tablet by mouth once a day     ]

## 2010-07-20 NOTE — Assessment & Plan Note (Signed)
Summary: Depression/Fibromyalgia   Vital Signs:  Patient profile:   45 year old female Weight:      179 pounds Temp:     97.9 degrees F Pulse rate:   72 / minute Pulse rhythm:   regular Resp:     22 per minute BP sitting:   121 / 80  (right arm) Cuff size:   regular  Vitals Entered By: Vesta Mixer CMA (Oct 22, 2009 2:22 PM) CC: 6 week f/u , started cymbalta and has lined up her own private therapy, Depression, Lipid Management Is Patient Diabetic? No Pain Assessment Patient in pain? yes     Location: lower body Intensity: 7-8  Does patient need assistance? Ambulation Normal   Primary Care Provider:  Lehman Prom FNP  CC:  6 week f/u , started cymbalta and has lined up her own private therapy, Depression, and Lipid Management.  History of Present Illness:  Pt into the office for 6 week f/u  Social - pt is in the process of moving into a new house.  She has been moving and doing the best she can to move furniture because she has to get out of her current residence by the end of this week.  Rheumatology - Elevated ANA for which pt was referred to rheumatology Fibromyalgia: Pt was started on cymbalta by this provider on her last visit.   Aqu[place] Solian - LCSW in this office and she was referred to a private therapist. She has been to one session (Dr. Rae Roam - [facility]) and will call to schedule next session on next week.  Water aerobics - pt has completed the application for enrollment but due to move has not submitted it.  Depression History:      Positive alarm features for depression include fatigue (loss of energy).  However, she denies recurrent thoughts of death or suicide.        The patient denies that she feels like life is not worth living, denies that she wishes that she were dead, and denies that she has thought about ending her life.        Comments:  cymbalta was added to pt's regimen.  Depression Treatment History:  Prior Medication Used:   Start Date:  Assessment of Effect:   Comments:  lexapro     05/08/2009   started     improved at f/u visit  Lipid Management History:      Positive NCEP/ATP III risk factors include current tobacco user.  Negative NCEP/ATP III risk factors include female age less than 68 years old, no history of early menopause without estrogen hormone replacement, non-diabetic, non-hypertensive, no ASHD (atherosclerotic heart disease), no prior stroke/TIA, no peripheral vascular disease, and no history of aortic aneurysm.        The patient states that she knows about the "Therapeutic Lifestyle Change" diet.  Her compliance with the TLC diet is fair.  She expresses no side effects from her lipid-lowering medication.  The patient denies any symptoms to suggest myopathy or liver disease.  Comments: cholesterol meds filled in 05/2009 with only 3 refills.  advised pt that based on last lab check she needs continue meds.    Habits & Providers  Alcohol-Tobacco-Diet     Alcohol drinks/day: <1     Alcohol Counseling: not indicated; use of alcohol is not excessive or problematic     Alcohol type: wine     Tobacco Status: current     Tobacco Counseling: to quit use of tobacco  products     Cigarette Packs/Day: 0.5     Year Started: age 33  Exercise-Depression-Behavior     Does Patient Exercise: no     Have you felt down or hopeless? yes     Have you felt little pleasure in things? yes     Depression Counseling: further diagnostic testing and/or other treatment is indicated     Drug Use: past  Allergies (verified): 1)  ! Neomycin  Review of Systems CV:  Denies chest pain or discomfort. Resp:  Denies cough. MS:  Complains of joint pain and low back pain; right hip pain. Derm:  Complains of lesion(s). Neuro:  Denies headaches. Psych:  Complains of depression; but can see some improvement with the start of cymbalta.  Physical Exam  General:  alert.   Head:  normocephalic.   Lungs:  normal breath sounds.   Heart:   normal rate and regular rhythm.   Msk:  up to the exam table but slow left wrist support in place slouched gait Neurologic:  alert & oriented X3.   Skin:  left 5th toe - incision (healed) Psych:  Oriented X3.     Impression & Recommendations:  Problem # 1:  FIBROMYALGIA (ICD-729.1) pt has been to rheumtology as ordered Her updated medication list for this problem includes:    Diclofenac Sodium 75 Mg Tbec (Diclofenac sodium) ..... One tablet by mouth two times a day for joints  Problem # 2:  DEPRESSION, MAJOR (ICD-296.20) pt has been to [facility] as ordered  Problem # 3:  HYPERCHOLESTEROLEMIA (ICD-272.0) labs reviewed from previous visit Her updated medication list for this problem includes:    Gemfibrozil 600 Mg Tabs (Gemfibrozil) ..... One tablet by mouth two times a day 30 minutes with food  Problem # 4:  UNSPECIFIED VITAMIN D DEFICIENCY (ICD-268.9) pt to transition to OTC supplement  Complete Medication List: 1)  Diclofenac Sodium 75 Mg Tbec (Diclofenac sodium) .... One tablet by mouth two times a day for joints 2)  Trazodone Hcl 50 Mg Tabs (Trazodone hcl) .Marland Kitchen.. 1-2 tablets by mouth nightly as needed for sleep 3)  Lexapro 10 Mg Tabs (Escitalopram oxalate) .... One tablet by mouth daily for anxiety 4)  Gemfibrozil 600 Mg Tabs (Gemfibrozil) .... One tablet by mouth two times a day 30 minutes with food 5)  Cymbalta 30 Mg Cpep (Duloxetine hcl) .... One tablet by mouth daily 6)  Vitamin D 1000 Unit Tabs (Cholecalciferol) .... One tablet by mouth two times a day  Lipid Assessment/Plan:      Based on NCEP/ATP III, the patient's risk factor category is "0-1 risk factors".  The patient's lipid goals are as follows: Total cholesterol goal is 200; LDL cholesterol goal is 160; HDL cholesterol goal is 40; Triglyceride goal is 150.  Her LDL cholesterol goal has not been met.  She has been counseled on adjunctive measures for lowering her cholesterol and has been provided with dietary  instructions.    Patient Instructions: 1)  Try to complete your process for water aerobics 2)  Call to schedule an appointment for your therapist. 3)  I'm hopeful that this will lead to improvement in your  4)  Follow up in this office in 2 months for fbromyalgia. Prescriptions: GEMFIBROZIL 600 MG TABS (GEMFIBROZIL) One tablet by mouth two times a day 30 minutes with food  #60 x 5   Entered and Authorized by:   Lehman Prom FNP   Signed by:   Lehman Prom FNP on 10/22/2009  Method used:   Electronically to        [id number] (retail)       [address]       [place], [place]  [address]       Ph: [phone]       Fax: [phone]   RxID:   [id number]

## 2010-07-20 NOTE — Assessment & Plan Note (Signed)
Summary: meet new doc/pap/pe [facility]   Vital Signs:  Patient Profile:   45 Years Old Female Height:     64.25 inches Weight:      199 pounds Pulse rate:   100 / minute BP sitting:   133 / 92  (right arm)  Pt. in pain?   yes    Location:   body    Intensity:   7  Vitals Entered By: Arlyss Repress CMA, (March 15, 2007 3:32 PM)              Is Patient Diabetic? No     PCP:  Marisue Ivan  MD  Chief Complaint:  meet new doctor and refill meds.  History of Present Illness: Cindy Baker is a 45yo WF h/o drug abuse and recurrent depression that presents to the clinic c/o right gluteal pain and requesting refills on her pain medication.  Gluteal pain- She reports that the pain is localized only to the right gluteus.  It does not radiate down her leg.  She denies any bowel incontinence, wt loss, or fever.  Pain is describe as both achy and sharp and intermittent that has been going on for several weeks since she started having more responsibilities at her job.  She reports that she is walking more and climbing more stairs.  Depression- Reports that she battles depression intermittently and is secondary to the many stressors she is experiencing with her personal relationships and financial struggles.  Lifestyle modifications- She denies exercising routinely.  She states that she walks while at work.  She continues to smoke intermittently.    Current Allergies: ! NEOMYCIN  Past Medical History:    Reviewed history from 08/17/2006 and no changes required:       Chronic R knee pain s/p injury, [id number]  (NSVD x 2), h/o depression  Past Surgical History:    Reviewed history from 08/17/2006 and no changes required:       04/19/06 ldl: 130 hdl: 46 tri: 281 - 04/28/2006, BTL - 06/20/1994, R tympanic membrane rupture age 73 - 06/20/1970   Family History:    Reviewed history from 11/20/2006 and no changes required:       Breast CA- granmother, DM-sister, grandmother, HTN-Dad, Lung  CA-granfather, sister with lupus  Social History:    Reviewed history from 01/03/2007 and no changes required:       Undergoing divorce; 2 children (20yo daughter, 10yo daughter); lives in [place] with husband and 10yo Passenger transport manager; works in housekeeping at [employer];; 1ppd x 15 yrs10/2003 and restarted; hx of alcoholism and benzo and crack useaddiction; not exercising.  Former Ship broker.   Risk Factors:  Tobacco use:  current    Physical Exam  General:     Drowsy, mildly cooperative, irritable appearing, overweight, older appearing for age WF in NAD Lungs:     Normal respiratory effort, chest expands symmetrically. Lungs are clear to auscultation, no crackles or wheezes. Heart:     Normal rate and regular rhythm. S1 and S2 normal without gallop, murmur, click, rub or other extra sounds. Abdomen:     Bowel sounds positive,abdomen soft and non-tender without masses, organomegaly or hernias noted. Msk:     Pt experiences localized pain to the SI joint upon deep palpation.  Pt has an abnormal gait.  Pt doesn't appear to have an atalgic gait or shorter strides.  She doesn't seem to favor one side or the other but appears to have an unsmooth stride.  She has full flextion and  extension at the hip joint bilaterally.  She able to abduct and adduct equally both passively and actively with both hips.  No pain or deformities or limited motion at the knees.  No radiating pain with palpation.   Pulses:     2+ pedal pulses Extremities:     No clubbing, cyanosis, edema, or deformity noted with normal full range of motion of all joints.      Impression & Recommendations:  Problem # 1:  SACROILIITIS NEC (ICD-720.2) Pt appears to have ongoing pain that is most likely sacroilitis.  She was instructed that the primary treatment would be controlling inflammation and pain but the primary treatment will have to be physical therapy.  She cannot affort at this time to go to physical therapy  so I have instructed her to begin lower back strengthening and flexibility exercises as well as stretches that may stretch the gluteus muscles. Because of her previous history of drug abuse, she was asked to sign a contract to control the amount of pain medication she is to receive. Pt was very irritable until I agreed to give her a limited amount of percocet.  I have concern about the possibility of abuse of meds with this patient so I have asked to see her again in 3 months.  Problem # 2:  TOBACCO DEPENDENCE (ICD-305.1) She is not interested at this time to completely quit her smoking.     Her updated medication list for this problem includes:    Chantix Starting Month Pak 0.5 Mg X 11 & 1 Mg X 42 Misc (Varenicline tartrate) .Marland Kitchen... Take 1 tablet by mouth as directed    Chantix 1 Mg Tabs (Varenicline tartrate) ..... One tab by mouth bid  Orders: [facility]- Est  Level 4 (09811)   Problem # 3:  Preventive Health Care (ICD-V70.0) We discussed lifestyle modifications concerning improved diet and excercise.  We talked about the positive effects of losing weight.  She was not very interested during this visit to discuss these issues.  I will continue to bring up the issue at future visits.  Complete Medication List: 1)  Amitriptyline Hcl 25 Mg Tabs (Amitriptyline hcl) .... Take 1 tablet by mouth at bedtime 2)  Chantix Starting Month Pak 0.5 Mg X 11 & 1 Mg X 42 Misc (Varenicline tartrate) .... Take 1 tablet by mouth as directed 3)  Flexeril 10 Mg Tabs (Cyclobenzaprine hcl) .... Take 1 tablet by mouth at bedtime 4)  Flonase 50 Mcg/act Susp (Fluticasone propionate) .... 2 spray into both nostrils once a day 5)  Hydrocodone-acetaminophen 5-325 Mg Tabs (Hydrocodone-acetaminophen) .... Take 1 tablet by mouth once a day 6)  Omeprazole 20 Mg Cpdr (Omeprazole) .... Take 1 capsule by mouth once a day 7)  Naproxen 500 Mg Tabs (Naproxen) .... One tab twice daily 8)  Chantix 1 Mg Tabs (Varenicline tartrate) ....  One tab by mouth bid 9)  Loratadine 10 Mg Tabs (Loratadine) .... Take 1 tablet by mouth once a day   Patient Instructions: 1)  Please schedule a follow-up appointment in 3 months. 2)  Percocet 5/325 take one tablet by mouth as needed for pain. 3)  Adhere to pain contract. 4)  Utilize back stretches & back strengthening exercises.    ]

## 2010-07-20 NOTE — Assessment & Plan Note (Signed)
Summary: boil rt side of head/ls   Vital Signs:  Patient Profile:   45 Years Old Female Height:     64.25 inches Weight:      198.6 pounds BMI:     33.95 Temp:     98.6 degrees F Pulse rate:   93 / minute BP sitting:   122 / 85  (right arm)  Pt. in pain?   yes    Location:   muscles and joints,fibromyalgia    Intensity:   6    Type:       aching  Vitals Entered By: Theresia Lo RN (February 02, 2007 3:45 PM)              Is Patient Diabetic? No   Chief Complaint:  boil rt side of head.  History of Present Illness: "boils".  Daughter at home with recurrent boils.  daughter is now on antibiotics.  patient with new lesion (only 1 lesion to date) on scalp.  started  ~1wk ago with increase in erythema, edema, pain.  spont. drainage several days ago with resolution of erythema and pain.  no recurrent drainange.  no fevers, chills, NAV, other rash.  father at home with cancer.  patient has been sharing towels with other family members.  Current Allergies: ! NEOMYCIN  Past Medical History:    Reviewed history from 08/17/2006 and no changes required:       Chronic R knee pain s/p injury, [id number]  (NSVD x 2), h/o depression   Social History:    Reviewed history from 01/03/2007 and no changes required:       Married x 84yrs; 2 children (20yo daughter, 10yo daughter); lives in [place] with husband and Water quality scientist; works in housekeeping at [employer];; 1ppd x 15 yrs10/2003 and restarted; hx of alcoholism and benzo and crack useaddiction; not exercising.  Former Ship broker.    Review of Systems  The patient denies anorexia, fever, and weight loss.         otherwise n/c.  see hpi.   Physical Exam  General:     GEN: nad, alert and oriented HEENT: mucous membranes moist, 1cm lesion on R scalp above ear with adherent scab.  no erythema.  no pus expressed.  not firm, not fluctuant.  no other lesions/rash. NECK: supple, no LA PULM: no inc wob EXT: no  edema SKIN: no other acute rash     Impression & Recommendations:  Problem # 1:  BOILS, RECURRENT (ICD-680.9) Assessment: New see instructions.  already drained spontan. and no need for intervention.  no antibiotics.  routine f/u.  greater than 15 min spent with patient.  Orders: [facility]- Est Level  3 (11914)   Complete Medication List: 1)  Amitriptyline Hcl 25 Mg Tabs (Amitriptyline hcl) .... Take 1 tablet by mouth at bedtime 2)  Chantix Starting Month Pak 0.5 Mg X 11 & 1 Mg X 42 Misc (Varenicline tartrate) .... Take 1 tablet by mouth as directed 3)  Flexeril 10 Mg Tabs (Cyclobenzaprine hcl) .... Take 1 tablet by mouth at bedtime 4)  Flonase 50 Mcg/act Susp (Fluticasone propionate) .... 2 spray into both nostrils once a day 5)  Hydrocodone-acetaminophen 5-325 Mg Tabs (Hydrocodone-acetaminophen) .... Take 1 tablet by mouth once a day 6)  Omeprazole 20 Mg Cpdr (Omeprazole) .... Take 1 capsule by mouth once a day 7)  Naproxen 500 Mg Tabs (Naproxen) .... One tab twice daily 8)  Chantix 1 Mg Tabs (Varenicline tartrate) .... One tab by mouth  bid 9)  Loratadine 10 Mg Tabs (Loratadine) .... Take 1 tablet by mouth once a day   Patient Instructions: 1)  Your spot should gradually go away.  Use antibacterial soap.  Wash your hands frequently.  Use you own towel and washcloth.  All family members should do these same things.  If your father has any spots come up, he should be seen quickly.  If your daughter continues to have sores, she should be rechecked.  Take care.  I was glad to see you today.  If you have any other concerns, let your regular doctor know.

## 2010-07-20 NOTE — Letter (Signed)
Summary: *HSN Results Follow up  [facility]  [address] [place], [place] [address]   Phone: [phone]  Fax: [phone]      06/04/2009   Cindy Baker [address] [place], [place]  [address]   Dear  Ms. [name],                            ____S.Drinkard,FNP   ____D. Gore,FNP       ____B. McPherson,MD   ____V. Rankins,MD    ____E. Mulberry,MD    ____N. Daphine Deutscher, FNP  ____D. Reche Dixon, MD    ____K. Philipp Deputy, MD    ____Other     This letter is to inform you that your recent test(s):  _______Pap Smear    _______Lab Test     _______X-ray    _______ is within acceptable limits  _______ requires a medication change  _______ requires a follow-up lab visit  _______ requires a follow-up visit with your provider   Comments:  We have tried to reach you at [phone].  An appointment needs to be scheduled with your provider.  Please contact the office at your earliest convenience.       _________________________________________________________ If you have any questions, please contact our office                     Sincerely,  Levon Hedger [facility]

## 2010-07-20 NOTE — Assessment & Plan Note (Signed)
Summary: CPE WITH PAP/KH   Vital Signs:  Patient Profile:   45 Years Old Female Height:     64.25 inches Weight:      201 pounds Pulse rate:   110 / minute BP sitting:   155 / 97  Vitals Entered By: Lillia Pauls CMA (April 02, 2007 3:47 PM)                 PCP:  Marisue Ivan  MD  Chief Complaint:  cpp.  History of Present Illness: Cindy Baker is a 45yo with an extensive past medical and social history that presents to the clinic today for a female wellness exam.  She reports a family history of breast cancer and cervical cancer and would like a pap smear and breast exam as well as STD exam.  Pap smear:  She has had a normal pap smear on her last 3 pap smears in the past 6 years.  After explaining to her that she does not need to be checked but every 3 years, she insist and request a pap smear.    Breast exam:  She reports that her mom has had 3 lumpectomies for benign lesions and her maternal grandmother has had bilateral masectomy.  She reports checking her self occasionally but is not confident in her self exams.    STDs:  She has had the same partner for 3 years but insist on being checked for GC/Chlamydia b/c she does not completely trust her partner.  She c/o occasional white creamy vaginal discharge with a foul odor.  No pelvic pain.  No dyuria. No hematuria.  No pain with coitus.  Current Allergies: ! NEOMYCIN     Review of Systems       per HPI   Physical Exam  General:     Well-developed,well-nourished,in no acute distress; alert,appropriate and cooperative throughout examination Breasts:     No mass, nodules, thickening, tenderness, bulging, retraction, inflamation, nipple discharge or skin changes noted.   Lungs:     Normal respiratory effort, chest expands symmetrically. Lungs are clear to auscultation, no crackles or wheezes. Heart:     Normal rate and regular rhythm. S1 and S2 normal without gallop, murmur, click, rub or other extra  sounds. Abdomen:     Bowel sounds positive,abdomen soft and non-tender without masses, organomegaly or hernias noted. Genitalia:     Normal introitus for age, no external lesions, no vaginal discharge, mucosa pink and moist, no vaginal or cervical lesions, no vaginal atrophy, no friaility or hemorrhage,     Impression & Recommendations:  Problem # 1:  BREAST CANCER, FAMILY HX (ICD-V16.3) Patient's exam was benign and reassuring.  Instructed patient to continue self exams.  Scheduled a mammogram.   Orders: Mammogram (Screening) (Mammo) [facility]- Est Level  3 (44010)   Problem # 2:  EXPOSURE TO VIRAL DISEASE, NEC (ICD-V01.79) Although she has had nl pap smears, she is considered at risk with her partner not being trustworthy with being monogamous.  Today, we collected a wet prep, GC/Chlamydia probe, and a pap smear.  We will f/u with her in 3 months.   Orders: Pap Smear-[facility] (27253-66440) Wet Prep- [facility] (34742) GC/Chlamydia-[facility] (87591/87491) [facility]- Est Level  3 (59563)   Complete Medication List: 1)  Amitriptyline Hcl 25 Mg Tabs (Amitriptyline hcl) .... Take 1 tablet by mouth at bedtime 2)  Chantix Starting Month Pak 0.5 Mg X 11 & 1 Mg X 42 Misc (Varenicline tartrate) .... Take 1 tablet by mouth as directed  3)  Flexeril 10 Mg Tabs (Cyclobenzaprine hcl) .... Take 1 tablet by mouth at bedtime 4)  Flonase 50 Mcg/act Susp (Fluticasone propionate) .... 2 spray into both nostrils once a day 5)  Hydrocodone-acetaminophen 5-325 Mg Tabs (Hydrocodone-acetaminophen) .... Take 1 tablet by mouth once a day 6)  Omeprazole 20 Mg Cpdr (Omeprazole) .... Take 1 capsule by mouth once a day 7)  Naproxen 500 Mg Tabs (Naproxen) .... One tab twice daily 8)  Chantix 1 Mg Tabs (Varenicline tartrate) .... One tab by mouth bid 9)  Loratadine 10 Mg Tabs (Loratadine) .... Take 1 tablet by mouth once a day   Patient Instructions: 1)  Please schedule a follow-up appointment in 3 months. 2)  Will call you with if  there are abnormal lab results.  Otherwise, you will receive your results by mail. 3)  Pleae remember to schedule your mammogram. 4)  Please take 1000mg  of Calcium per day.    ] Laboratory Results  Date/Time Received: April 02, 2007 4:15 PM  Date/Time Reported: April 02, 2007 4:27 PM   Wet Mount/KOH Source: Vaginal WBC/hpf: 5-8 Bacteria/hpf: 3+ Clue cells/hpf: few Yeast/hpf: none Trichomonas/hpf: none Comments: occ RBC present ...................................................................DONNA [facility]  April 02, 2007 4:28 PM

## 2010-07-20 NOTE — Miscellaneous (Signed)
Summary: ca number for surgery  Clinical Lists Changes    pt fx finger last Thursday. surg sched for 4/30 at [facility]. CA number given

## 2010-07-20 NOTE — Assessment & Plan Note (Signed)
Summary: Depession/Fibromyalgia   Vital Signs:  Patient profile:   45 year old female Weight:      181.8 pounds Temp:     98.3 degrees F oral Pulse rate:   99 / minute Pulse rhythm:   regular Resp:     20 per minute BP sitting:   131 / 83  (left arm) Cuff size:   regular  Vitals Entered By: Geanie Cooley  (September 10, 2009 2:18 PM) CC: Pt here for depression. Pt states she is having pain in her entire lower body, its mostly a stinging and burning pain. Pt states theres is a lot of pain in her right leg. Pt states that her cycle are getting very heavy and longer and they are coming twice a month. , Lipid Management, Depression, Abdominal Pain Is Patient Diabetic? No Pain Assessment Patient in pain? yes     Location: lower back Intensity: 7 Type: stinging Onset of pain  Constant  Does patient need assistance? Functional Status Self care Ambulation Normal   Primary Care Provider:  Lehman Prom FNP  CC:  Pt here for depression. Pt states she is having pain in her entire lower body, its mostly a stinging and burning pain. Pt states theres is a lot of pain in her right leg. Pt states that her cycle are getting very heavy and longer and they are coming twice a month. , Lipid Management, Depression, and Abdominal Pain.  History of Present Illness:  Pt into the office for depression.  Fibromyalgia - pt has been to rheumatolgy as ordered.  Dx of fibromyalgia. Pt did keep the Rheumatology appt and has had some x-rays of hips.  She will return next month for the results Pt is ready for the summer months so that she can get out and about.   Social - pt is still unemployed.  Depression History:      Positive alarm features for depression include fatigue (loss of energy).  However, she denies insomnia.        Psychosocial stress factors include major life changes.  The patient denies that she feels like life is not worth living, denies that she wishes that she were dead, and denies  that she has thought about ending her life.        Comments:  She is looking foward to the warm weather so that she can get her tomato plants and small flower garden.  Depression Treatment History:  Prior Medication Used:   Start Date: Assessment of Effect:   Comments:  lexapro     05/08/2009   started     improved at f/u visit  Dyspepsia History:      She has no alarm features of dyspepsia including no history of melena, hematochezia, dysphagia, persistent vomiting, or involuntary weight loss > 5%.  There is a prior history of GERD.  The patient does not have a prior history of documented ulcer disease.  The dominant symptom is heartburn or acid reflux.  An H-2 blocker medication is not currently being taken.  She has no history of a positive H. Pylori serology.  No previous upper endoscopy has been done.    Lipid Management History:      Positive NCEP/ATP III risk factors include current tobacco user.  Negative NCEP/ATP III risk factors include female age less than 25 years old, no history of early menopause without estrogen hormone replacement, non-diabetic, non-hypertensive, no ASHD (atherosclerotic heart disease), no prior stroke/TIA, no peripheral vascular disease, and no history  of aortic aneurysm.        The patient states that she knows about the "Therapeutic Lifestyle Change" diet.  Her compliance with the TLC diet is fair.  The patient expresses understanding of adjunctive measures for cholesterol lowering.  She expresses no side effects from her lipid-lowering medication.  Comments include: pt is taking gemfibrozil as ordered.  The patient denies any symptoms to suggest myopathy or liver disease.       Habits & Providers  Alcohol-Tobacco-Diet     Alcohol drinks/day: <1     Alcohol Counseling: not indicated; use of alcohol is not excessive or problematic     Alcohol type: wine     Tobacco Status: current     Tobacco Counseling: to quit use of tobacco products     Cigarette  Packs/Day: 0.5     Year Started: age 54     Year Quit: QUIT DATE IS TOMORROW  Exercise-Depression-Behavior     Does Patient Exercise: no     Have you felt down or hopeless? yes     Have you felt little pleasure in things? yes     Depression Counseling: further diagnostic testing and/or other treatment is indicated     Drug Use: past  Allergies (verified): 1)  ! Neomycin  Review of Systems General:  Denies fatigue. CV:  Denies chest pain or discomfort. Resp:  Denies cough. GI:  Complains of diarrhea; Hx IBS. MS:  bil hip - hurts to stand and even more to walk and ambulate. Psych:  Complains of depression.  Physical Exam  General:  alert.   Head:  normocephalic.   Ears:  ear piercing(s) noted.   Lungs:  normal breath sounds.   Heart:  normal rate and regular rhythm.   Msk:  laying on exam table during the visit Neurologic:  limping gait - stooped posture   Impression & Recommendations:  Problem # 1:  HYPERCHOLESTEROLEMIA (ICD-272.0) labs reviewed as per check this week  advised pt to continue her medication Her updated medication list for this problem includes:    Gemfibrozil 600 Mg Tabs (Gemfibrozil) ..... One tablet by mouth two times a day 30 minutes with food  Problem # 2:  UNSPECIFIED VITAMIN D DEFICIENCY (ICD-268.9) lab still low but she has not completed her supplement she was not able to get it when ordered due to income  Problem # 3:  FIBROMYALGIA (ICD-729.1) pt was dx by the Rheumatogist  will start at cymbalta 30mg  daily Her updated medication list for this problem includes:    Diclofenac Sodium 75 Mg Tbec (Diclofenac sodium) ..... One tablet by mouth two times a day for joints  Problem # 4:  DEPRESSION, MAJOR (ICD-296.20) pt has an appt with amanda vaughn next week will start cymbalta. pt is already on SSRI so will need to be careful  Complete Medication List: 1)  Diclofenac Sodium 75 Mg Tbec (Diclofenac sodium) .... One tablet by mouth two times a  day for joints 2)  Trazodone Hcl 50 Mg Tabs (Trazodone hcl) .Marland Kitchen.. 1-2 tablets by mouth nightly as needed for sleep 3)  Lexapro 10 Mg Tabs (Escitalopram oxalate) .... One tablet by mouth daily for anxiety 4)  Chantix Continuing Month Pak 1 Mg Tabs (Varenicline tartrate) .... One tablet by mouth two times a day 5)  Gemfibrozil 600 Mg Tabs (Gemfibrozil) .... One tablet by mouth two times a day 30 minutes with food 6)  Ergocalciferol 50000 Unit Caps (Ergocalciferol) .... One capsule weekly for bones 7)  Cymbalta 30 Mg Cpep (Duloxetine hcl) .... One tablet by mouth daily  Dyspepsia Assessment/Plan:  Step Therapy: GERD Treatment Protocols:    Step-1: started  Lipid Assessment/Plan:      Based on NCEP/ATP III, the patient's risk factor category is "0-1 risk factors".  The patient's lipid goals are as follows: Total cholesterol goal is 200; LDL cholesterol goal is 160; HDL cholesterol goal is 40; Triglyceride goal is 150.  Her LDL cholesterol goal has not been met.  She has been counseled on adjunctive measures for lowering her cholesterol and has been provided with dietary instructions.    Patient Instructions: 1)  Fibromyalgia - Start cymbalta 30mg  by mouth daily.  It will take time to build up in your system. 2)  It is very important that you get adequate sleep and rest. 3)  Keep your appointment with Aquilla Solian for a therapy session. 4)  [employer] - water aerobics is a great idea. 5)  Keep your appointment with the rheumatology  6)  Follow up with n.martin,fnp in 6 weeks for medication review. Prescriptions: CYMBALTA 30 MG CPEP (DULOXETINE HCL) One tablet by mouth daily  #30 x 3   Entered and Authorized by:   Lehman Prom FNP   Signed by:   Lehman Prom FNP on 09/10/2009   Method used:   Print then Give to Patient   RxID:   [id number]    Vital Signs:  Patient profile:   45 year old female Weight:      181.8 pounds Temp:     98.3 degrees F oral Pulse rate:   99 /  minute Pulse rhythm:   regular Resp:     20 per minute BP sitting:   131 / 83  (left arm) Cuff size:   regular  Vitals Entered By: Geanie Cooley  (September 10, 2009 2:18 PM)

## 2010-07-20 NOTE — Assessment & Plan Note (Signed)
Summary: sore throat, ear pain/ACM   Vital Signs:  Patient Profile:   45 Years Old Female Height:     64.25 inches Weight:      195 pounds Pulse rate:   105 / minute BP sitting:   145 / 85  Vitals Entered By: Lillia Pauls CMA (March 22, 2007 10:44 AM)                 PCP:  Marisue Ivan  MD  Chief Complaint:  SORE THROAT/EAR PAIN AND CONGESTION X SAT.  History of Present Illness: 45 y/o WF with: One week history of chills, stuffy nose, ear pressure/popping/pain.  Has h/o tympanic membrane perf in the past.  Has a history of seasonal allergies now on claritin only.  Had to miss work because of this. Having some aches and pains as well, but has a h/o fibromyalgia.  Infrequent dry cough worse at night.  Feels like posterior nasal drainage.  Current Allergies: ! NEOMYCIN      Physical Exam  General:     Well-developed,well-nourished,in no acute distress; alert,appropriate and cooperative throughout examination Head:     Normocephalic and atraumatic without obvious abnormalities. No apparent alopecia or balding. Eyes:     No corneal or conjunctival inflammation noted. EOMI. Perrla. Funduscopic exam benign, without hemorrhages, exudates or papilledema. Vision grossly normal. Ears:     rigt TM bulging and red, but translucent with no evidence of purulence. Nose:     External nasal examination shows no deformity or inflammation. Nasal mucosa are pink and moist without lesions or exudates. Mouth:     Oral mucosa and oropharynx without lesions or exudates.  Teeth in good repair. Neck:     No deformities, masses, or tenderness noted. Lungs:     Normal respiratory effort, chest expands symmetrically. Lungs are clear to auscultation, no crackles or wheezes. Heart:     Normal rate and regular rhythm. S1 and S2 normal without gallop, murmur, click, rub or other extra sounds. Skin:     Intact without suspicious lesions or rashes Psych:     Cognition and judgment appear  intact. Alert and cooperative with normal attention span and concentration. No apparent delusions, illusions, hallucinations    Impression & Recommendations:  Problem # 1:  RHINITIS, ALLERGIC NOS (ICD-477.9) Assessment: Deteriorated I really think this is not bacterial OM at this point despite the bulgin fluid.  Will try to treat for two days with flonase.  If no improvement, she will try amoxicillin 1000mg  two times a day x 14 days.  Pt voices understanding. Her updated medication list for this problem includes:    Flonase 50 Mcg/act Susp (Fluticasone propionate) .Marland Kitchen... 2 spray into both nostrils once a day    Loratadine 10 Mg Tabs (Loratadine) .Marland Kitchen... Take 1 tablet by mouth once a day  Orders: [facility]- Est Level  3 (44010)   Complete Medication List: 1)  Amitriptyline Hcl 25 Mg Tabs (Amitriptyline hcl) .... Take 1 tablet by mouth at bedtime 2)  Chantix Starting Month Pak 0.5 Mg X 11 & 1 Mg X 42 Misc (Varenicline tartrate) .... Take 1 tablet by mouth as directed 3)  Flexeril 10 Mg Tabs (Cyclobenzaprine hcl) .... Take 1 tablet by mouth at bedtime 4)  Flonase 50 Mcg/act Susp (Fluticasone propionate) .... 2 spray into both nostrils once a day 5)  Hydrocodone-acetaminophen 5-325 Mg Tabs (Hydrocodone-acetaminophen) .... Take 1 tablet by mouth once a day 6)  Omeprazole 20 Mg Cpdr (Omeprazole) .... Take 1 capsule  by mouth once a day 7)  Naproxen 500 Mg Tabs (Naproxen) .... One tab twice daily 8)  Chantix 1 Mg Tabs (Varenicline tartrate) .... One tab by mouth bid 9)  Loratadine 10 Mg Tabs (Loratadine) .... Take 1 tablet by mouth once a day     ]

## 2010-07-21 ENCOUNTER — Encounter: Payer: Self-pay | Admitting: Internal Medicine

## 2010-07-22 NOTE — Assessment & Plan Note (Signed)
Summary: Chronic issues   Vital Signs:  Patient profile:   45 year old female Height:      64.25 inches Weight:      178 pounds BMI:     30.43 Temp:     97.1 degrees F oral Pulse rate:   88 / minute Pulse rhythm:   regular Resp:     18 per minute BP sitting:   124 / 84  (left arm) Cuff size:   regular  Vitals Entered By: [place] Shannon (June 08, 2010 8:59 AM)  Nutrition Counseling: Patient's BMI is greater than 25 and therefore counseled on weight management options. CC: cpp...., Abdominal Pain, Lipid Management, Depression  Does patient need assistance? Functional Status Self care Ambulation Normal   Primary Care Anevay Campanella:  Lehman Prom FNP  CC:  cpp...., Abdominal Pain, Lipid Management, and Depression.  History of Present Illness:  Pt into the office for a complete physical exam  PAP - Pt is currently on her menses.  This is the 4th day of her most recent cycle.  Mammogram - Pt has scheduled her appt for mammogram.  Maternal grandmother with breast cancer. Self breast exam placcard given to pt  Optho - Pt does wear glasses. last eye exam was 4 months ago  Dental - no recent dental exam  Rheumatolog - visits every 6 months  Anticoagulation Management History:      Negative risk factors for bleeding include an age less than 21 years old and no history of CVA/TIA.  The bleeding index is 'low risk'.  Negative CHADS2 values include History of HTN, Age > 43 years old, History of Diabetes, and Prior Stroke/CVA/TIA.    Depression History:      Positive alarm features for depression include fatigue (loss of energy).  However, she denies recurrent thoughts of death or suicide.        The patient denies that she feels like life is not worth living, denies that she wishes that she were dead, and denies that she has thought about ending her life.        Comments:  ongoing sessions with therapist.  Depression Treatment History:  Prior Medication Used:   Start Date:  Assessment of Effect:   Comments:  lexapro     05/08/2009   started     improved at f/u visit  Dyspepsia History:      She has no alarm features of dyspepsia including no history of melena, hematochezia, dysphagia, persistent vomiting, or involuntary weight loss > 5%.  There is a prior history of GERD.  The patient does not have a prior history of documented ulcer disease.  The dominant symptom is not heartburn or acid reflux.  An H-2 blocker medication is currently being taken.  She notes that the symptoms have not improved with the H-2 blocker therapy.  Symptoms have not persisted after 4 weeks of H-2 blocker treatment.  She has no history of a positive H. Pylori serology.  No previous upper endoscopy has been done.    Lipid Management History:      Positive NCEP/ATP III risk factors include current tobacco user.  Negative NCEP/ATP III risk factors include female age less than 64 years old, no history of early menopause without estrogen hormone replacement, non-diabetic, non-hypertensive, no ASHD (atherosclerotic heart disease), no prior stroke/TIA, no peripheral vascular disease, and no history of aortic aneurysm.        The patient states that she knows about the "Therapeutic Lifestyle Change" diet.  Her compliance with the TLC diet is fair.  The patient does not know about adjunctive measures for cholesterol lowering.  She expresses no side effects from her lipid-lowering medication.  The patient denies any symptoms to suggest myopathy or liver disease.  Comments: pt is fasting today for labs.     Habits & Providers  Alcohol-Tobacco-Diet     Alcohol drinks/day: <1     Alcohol Counseling: not indicated; use of alcohol is not excessive or problematic     Alcohol type: wine     Tobacco Status: current     Tobacco Counseling: to quit use of tobacco products     Cigarette Packs/Day: 0.5     Year Started: age 73     Year Quit: QUIT DATE IS TOMORROW  Exercise-Depression-Behavior     Does  Patient Exercise: no     Depression Counseling: further diagnostic testing and/or other treatment is indicated     Drug Use: past  Comments: PHQ-9 score = 15  Current Medications (verified): 1)  Diclofenac Sodium 75 Mg Tbec (Diclofenac Sodium) .... One Tablet By Mouth Two Times A Day For Joints 2)  Lexapro 10 Mg Tabs (Escitalopram Oxalate) .... One Tablet By Mouth Daily For Anxiety 3)  Gemfibrozil 600 Mg Tabs (Gemfibrozil) .... One Tablet By Mouth Two Times A Day 30 Minutes With Food 4)  Cymbalta 30 Mg Cpep (Duloxetine Hcl) .... One Tablet By Mouth Daily 5)  Ranitidine Hcl 150 Mg Tabs (Ranitidine Hcl) .... Take One Tablet By Mouth Once Daily At  Bedtime For Reflux 6)  Zyrtec-D Allergy & Congestion 5-120 Mg Xr12h-Tab (Cetirizine-Pseudoephedrine) .... One Tablet By Mouth Two Times A Day As Needed For Allergies 7)  Amitriptyline Hcl 25 Mg Tabs (Amitriptyline Hcl) .... One Tablet By Mouth Nightly For Rest  Allergies (verified): 1)  ! Neomycin  Review of Systems General:  Denies fever. CV:  Denies fatigue. Resp:  Complains of cough. GI:  Denies abdominal pain, nausea, and vomiting. MS:  Complains of joint pain; right knee - "grinds at times" . Neuro:  Complains of tremors; resting.  Physical Exam  General:  alert.   Head:  normocephalic.   Eyes:  pupils equal and pupils round.   Ears:  bil TM with bony landmarks present Nose:  no nasal discharge.   Mouth:  poor dentition.   Neck:  supple.   Breasts:  no masses.   Lungs:  normal breath sounds.   Heart:  normal rate and regular rhythm.   Abdomen:  normal bowel sounds.   Msk:  up to the exam table Neurologic:  alert & oriented X3.   gait - stooped gait resting tremor (right) Skin:  color normal.   Psych:  Oriented X3.     Impression & Recommendations:  Problem # 1:  TREMOR (ICD-781.0) ? if pt has early parkinson or if this is related to her fibromyalgia  Problem # 2:  HYPERCHOLESTEROLEMIA (ICD-272.0) will check labs  today Her updated medication list for this problem includes:    Gemfibrozil 600 Mg Tabs (Gemfibrozil) ..... One tablet by mouth two times a day 30 minutes with food  Orders: T-Lipid Profile (16109-60454)  Problem # 3:  ELEVATED BLOOD PRESSURE (ICD-796.2) BP is stable Orders: EKG w/ Interpretation (93000) T-Urine Microalbumin w/creat. ratio [phone]) UA Dipstick w/o Micro (manual) (21308) T-Comprehensive Metabolic Panel (65784-69629)  Problem # 4:  DEPRESSION, MAJOR (ICD-296.20)  Orders: T-TSH (52841-32440) Rapid HIV  (10272)  Problem # 5:  FIBROMYALGIA (ICD-729.1)  Her updated  medication list for this problem includes:    Diclofenac Sodium 75 Mg Tbec (Diclofenac sodium) ..... One tablet by mouth two times a day for joints  Problem # 6:  NEED PROPHYLACTIC VACCINATION&INOCULATION FLU (ICD-V04.81) flu vaccine given today  Problem # 7:  KNEE PAIN (ICD-719.46)  Her updated medication list for this problem includes:    Diclofenac Sodium 75 Mg Tbec (Diclofenac sodium) ..... One tablet by mouth two times a day for joints  Orders: Radiology other (Radiology Other)  Problem # 8:  TOBACCO DEPENDENCE (ICD-305.1) advised cessation  Complete Medication List: 1)  Diclofenac Sodium 75 Mg Tbec (Diclofenac sodium) .... One tablet by mouth two times a day for joints 2)  Lexapro 10 Mg Tabs (Escitalopram oxalate) .... One tablet by mouth daily for anxiety 3)  Gemfibrozil 600 Mg Tabs (Gemfibrozil) .... One tablet by mouth two times a day 30 minutes with food 4)  Cymbalta 30 Mg Cpep (Duloxetine hcl) .... One tablet by mouth daily 5)  Ranitidine Hcl 150 Mg Tabs (Ranitidine hcl) .... Take one tablet by mouth once daily at  bedtime for reflux 6)  Zyrtec-d Allergy & Congestion 5-120 Mg Xr12h-tab (Cetirizine-pseudoephedrine) .... One tablet by mouth two times a day as needed for allergies 7)  Amitriptyline Hcl 25 Mg Tabs (Amitriptyline hcl) .... One tablet by mouth nightly for  rest  Other Orders: Mammogram (Screening) (Mammo) T-CBC w/Diff (84132-44010) T-Culture, Urine ([id number]) Flu Vaccine 67yrs + (34742) Admin 1st Vaccine (59563)  Dyspepsia Assessment/Plan:  Step Therapy: GERD Treatment Protocols:    Step-1: improved  Lipid Assessment/Plan:      Based on NCEP/ATP III, the patient's risk factor category is "2 or more risk factors and a calculated 10 year CAD risk of < 20%".  The patient's lipid goals are as follows: Total cholesterol goal is 200; LDL cholesterol goal is 160; HDL cholesterol goal is 40; Triglyceride goal is 150.  Her LDL cholesterol goal has not been met.  She has been counseled on adjunctive measures for lowering her cholesterol and has been provided with dietary instructions.     Patient Instructions: 1)  PAP done today because of your cycle.  You will be rescheduled 2)  Keep your appointment for mammogram 3)  Your labs will be checked today and you will be notified of fhe results. 4)  Right knee - get the x-ray of the knee.  The results will be sent to this office and review at next visit. No appointment necessary just take order to [facility] 5)  Schedule a follow up visit for a complete physical exam. 6)  You wil need PAP, rectal.   Orders Added: 1)  Mammogram (Screening) [Mammo] 2)  EKG w/ Interpretation [93000] 3)  T-Urine Microalbumin w/creat. ratio [[id number]] 4)  UA Dipstick w/o Micro (manual) [81002] 5)  T-TSH [87564-33295] 6)  Rapid HIV  [92370] 7)  T-CBC w/Diff [18841-66063] 8)  T-Comprehensive Metabolic Panel [80053-22900] 9)  T-Lipid Profile [80061-22930] 10)  Est. Patient Level III [01601] 11)  Radiology other [Radiology Other] 12)  T-Culture, Urine [09323-55732] 13)  Flu Vaccine 46yrs + [90658] 14)  Admin 1st Vaccine [90471]   Immunizations Administered:  Influenza Vaccine # 1:    Vaccine Type: Fluvax 3+    Site: left deltoid    Mfr: GlaxoSmithKline    Dose: 0.5 ml    Route: IM    Given by:  Levon Hedger    Exp. Date: 12/18/2010    Lot #: [id number]    VIS  given: 01/12/10 version given June 08, 2010.  Flu Vaccine Consent Questions:    Do you have a history of severe allergic reactions to this vaccine? no    Any prior history of allergic reactions to egg and/or gelatin? no    Do you have a sensitivity to the preservative Thimersol? no    Do you have a past history of Guillan-Barre Syndrome? no    Do you currently have an acute febrile illness? no    Have you ever had a severe reaction to latex? no    Vaccine information given and explained to patient? yes    Are you currently pregnant? no    ndc  [phone]  Immunizations Administered:  Influenza Vaccine # 1:    Vaccine Type: Fluvax 3+    Site: left deltoid    Mfr: GlaxoSmithKline    Dose: 0.5 ml    Route: IM    Given by: Levon Hedger    Exp. Date: 12/18/2010    Lot #: [id number]    VIS given: 01/12/10 version given June 08, 2010.    Laboratory Results   Urine Tests  Date/Time Received: June 08, 2010 9:05 AM   Routine Urinalysis   Glucose: negative   (Normal Range: Negative) Bilirubin: negative   (Normal Range: Negative) Ketone: negative   (Normal Range: Negative) Spec. Gravity: >=1.030   (Normal Range: 1.003-1.035) Blood: trace-intact   (Normal Range: Negative) pH: 5.5   (Normal Range: 5.0-8.0) Protein: negative   (Normal Range: Negative) Urobilinogen: 0.2   (Normal Range: 0-1) Nitrite: negative   (Normal Range: Negative) Leukocyte Esterace: trace   (Normal Range: Negative)    Date/Time Received: June 08, 2010 10:40 AM   Other Tests  Rapid HIV: negative   Prevention & Chronic Care Immunizations   Influenza vaccine: Fluvax 3+  (06/08/2010)    Tetanus booster: 11/18/2000: Done.    Pneumococcal vaccine: Not documented  Other Screening   Pap smear:  Specimen Adequacy: Satisfactory for evaluation.   Interpretation/Result:Shift in flora c/w Bacterial Vaginosis.     (06/01/2009)   Pap smear action/deferral: Ordered  (06/01/2009)   Pap smear due: 05/2010    Mammogram: ASSESSMENT: Negative - BI-RADS 1^MM DIGITAL SCREENING  (06/09/2009)   Mammogram action/deferral: Ordered  (06/01/2009)   Smoking status: current  (06/08/2010)   Smoking cessation counseling: yes  (06/01/2009)  Lipids   Total Cholesterol: 223  (09/07/2009)   Lipid panel action/deferral: Lipid Panel ordered   LDL: 134  (09/07/2009)   LDL Direct: Not documented   HDL: 45  (09/07/2009)   Triglycerides: 219  (09/07/2009)    SGOT (AST): 15  (06/01/2009)   SGPT (ALT): 23  (06/01/2009) CMP ordered    Alkaline phosphatase: 77  (06/01/2009)   Total bilirubin: 0.5  (06/01/2009)  Self-Management Support :    Lipid self-management support: Not documented    Nursing Instructions: Give Flu vaccine today     EKG  Procedure date:  06/08/2010  Findings:      NSR   Laboratory Results   Urine Tests    Routine Urinalysis   Glucose: negative   (Normal Range: Negative) Bilirubin: negative   (Normal Range: Negative) Ketone: negative   (Normal Range: Negative) Spec. Gravity: >=1.030   (Normal Range: 1.003-1.035) Blood: trace-intact   (Normal Range: Negative) pH: 5.5   (Normal Range: 5.0-8.0) Protein: negative   (Normal Range: Negative) Urobilinogen: 0.2   (Normal Range: 0-1) Nitrite: negative   (Normal Range: Negative) Leukocyte Esterace: trace   (Normal Range:  Negative)      Other Tests  Rapid HIV: negative

## 2010-07-22 NOTE — Letter (Signed)
Summary: TEST ORDER FORM/MAMMOGRAM  TEST ORDER FORM/MAMMOGRAM   Imported By: Arta Bruce 06/22/2010 14:51:56  _____________________________________________________________________  External Attachment:    Type:   Image     Comment:   External Document

## 2010-07-22 NOTE — Letter (Signed)
Summary: Lipid Letter  [facility]  [address] [place], [place] [address]   Phone: [phone]  Fax: [phone]    06/15/2010  Buena Boehm [address] [place], [place]  [address]  Dear Santina Evans:  We have carefully reviewed your last lipid profile from 06/08/2010 and the results are noted below with a summary of recommendations for lipid management.    Cholesterol:       195     Goal: less than 200   HDL "good" Cholesterol:   41     Goal: greater than 40   LDL "bad" Cholesterol:   119     Goal: less than 130   Triglycerides:       177     Goal: less than 150  Cholesterol is better.  Keep taking your medication as ordered. Try to eat more baked foods.     Current Medications: 1)    Diclofenac Sodium 75 Mg Tbec (Diclofenac sodium) .... One tablet by mouth two times a day for joints 2)    Lexapro 10 Mg Tabs (Escitalopram oxalate) .... One tablet by mouth daily for anxiety 3)    Gemfibrozil 600 Mg Tabs (Gemfibrozil) .... One tablet by mouth two times a day 30 minutes with food 4)    Cymbalta 30 Mg Cpep (Duloxetine hcl) .... One tablet by mouth daily 5)    Ranitidine Hcl 150 Mg Tabs (Ranitidine hcl) .... Take one tablet by mouth once daily at  bedtime for reflux 6)    Zyrtec-d Allergy & Congestion 5-120 Mg Xr12h-tab (Cetirizine-pseudoephedrine) .... One tablet by mouth two times a day as needed for allergies 7)    Amitriptyline Hcl 25 Mg Tabs (Amitriptyline hcl) .... One tablet by mouth nightly for rest  If you have any questions, please call. We appreciate being able to work with you.   Sincerely,    [facility] Lehman Prom FNP

## 2010-07-22 NOTE — Progress Notes (Signed)
Summary: Office Visit//DEPRESSION SCREENING  Office Visit//DEPRESSION SCREENING   Imported By: Arta Bruce 06/08/2010 10:51:24  _____________________________________________________________________  External Attachment:    Type:   Image     Comment:   External Document

## 2010-11-10 ENCOUNTER — Inpatient Hospital Stay (INDEPENDENT_AMBULATORY_CARE_PROVIDER_SITE_OTHER)
Admission: RE | Admit: 2010-11-10 | Discharge: 2010-11-10 | Disposition: A | Discharge status: Home / Self Care | Payer: Medicare Other | Source: Ambulatory Visit | Attending: Emergency Medicine | Admitting: Emergency Medicine

## 2010-11-10 ENCOUNTER — Ambulatory Visit (INDEPENDENT_AMBULATORY_CARE_PROVIDER_SITE_OTHER): Payer: Medicare Other

## 2010-11-10 DIAGNOSIS — W19XXXA Unspecified fall, initial encounter: Secondary | ICD-10-CM

## 2010-11-10 DIAGNOSIS — M79609 Pain in unspecified limb: Secondary | ICD-10-CM

## 2011-04-18 ENCOUNTER — Other Ambulatory Visit (HOSPITAL_COMMUNITY): Payer: Self-pay | Admitting: Family Medicine

## 2011-04-18 DIAGNOSIS — Z1231 Encounter for screening mammogram for malignant neoplasm of breast: Secondary | ICD-10-CM

## 2011-05-17 ENCOUNTER — Ambulatory Visit (HOSPITAL_COMMUNITY): Payer: Medicare Other

## 2011-06-20 ENCOUNTER — Ambulatory Visit (HOSPITAL_COMMUNITY)
Admission: RE | Admit: 2011-06-20 | Discharge: 2011-06-20 | Disposition: A | Discharge status: Home / Self Care | Payer: Medicare Other | Source: Ambulatory Visit | Attending: Family Medicine | Admitting: Family Medicine

## 2011-06-20 DIAGNOSIS — Z1231 Encounter for screening mammogram for malignant neoplasm of breast: Secondary | ICD-10-CM | POA: Insufficient documentation

## 2011-06-30 ENCOUNTER — Encounter: Payer: Self-pay | Admitting: Obstetrics & Gynecology

## 2011-06-30 ENCOUNTER — Other Ambulatory Visit (HOSPITAL_COMMUNITY)
Admission: RE | Admit: 2011-06-30 | Discharge: 2011-06-30 | Disposition: A | Discharge status: Home / Self Care | Payer: Medicare Other | Source: Ambulatory Visit | Attending: Obstetrics & Gynecology | Admitting: Obstetrics & Gynecology

## 2011-06-30 ENCOUNTER — Ambulatory Visit (INDEPENDENT_AMBULATORY_CARE_PROVIDER_SITE_OTHER): Payer: Medicare Other | Admitting: Obstetrics & Gynecology

## 2011-06-30 ENCOUNTER — Other Ambulatory Visit: Payer: Self-pay | Admitting: Obstetrics & Gynecology

## 2011-06-30 VITALS — BP 145/97 | HR 78 | Temp 98.7°F | Ht 64.0 in | Wt 188.7 lb

## 2011-06-30 DIAGNOSIS — N938 Other specified abnormal uterine and vaginal bleeding: Secondary | ICD-10-CM

## 2011-06-30 DIAGNOSIS — N939 Abnormal uterine and vaginal bleeding, unspecified: Secondary | ICD-10-CM

## 2011-06-30 DIAGNOSIS — Z7251 High risk heterosexual behavior: Secondary | ICD-10-CM

## 2011-06-30 DIAGNOSIS — Z1231 Encounter for screening mammogram for malignant neoplasm of breast: Secondary | ICD-10-CM

## 2011-06-30 DIAGNOSIS — Z609 Problem related to social environment, unspecified: Secondary | ICD-10-CM

## 2011-06-30 DIAGNOSIS — Z01812 Encounter for preprocedural laboratory examination: Secondary | ICD-10-CM

## 2011-06-30 DIAGNOSIS — N925 Other specified irregular menstruation: Secondary | ICD-10-CM | POA: Diagnosis not present

## 2011-06-30 DIAGNOSIS — N926 Irregular menstruation, unspecified: Secondary | ICD-10-CM

## 2011-06-30 DIAGNOSIS — A64 Unspecified sexually transmitted disease: Secondary | ICD-10-CM

## 2011-06-30 DIAGNOSIS — Z7289 Other problems related to lifestyle: Secondary | ICD-10-CM

## 2011-06-30 DIAGNOSIS — N949 Unspecified condition associated with female genital organs and menstrual cycle: Secondary | ICD-10-CM | POA: Insufficient documentation

## 2011-06-30 DIAGNOSIS — N760 Acute vaginitis: Secondary | ICD-10-CM

## 2011-06-30 DIAGNOSIS — Z23 Encounter for immunization: Secondary | ICD-10-CM

## 2011-06-30 DIAGNOSIS — Z113 Encounter for screening for infections with a predominantly sexual mode of transmission: Secondary | ICD-10-CM

## 2011-06-30 LAB — POCT PREGNANCY, URINE: Preg Test, Ur: NEGATIVE

## 2011-06-30 NOTE — Progress Notes (Signed)
  Subjective:    Patient ID: Cindy Baker, female    DOB: 1965-10-08, 46 y.o.   MRN: [id number]  HPI  Cindy Baker is a S W G3P2A1 who is here as a referral from [employer] because of DUB- 2 periods per month for last 9 months.  Review of Systems Bleeding rather heavily today Monogamous for 6 years (they live together)    Objective:   Physical Exam  12 week size uterus, no adnexal masses   Her UPT was negative, I prepped her cervix with betadine and did 2 passes with the Pipelle. She tolerated the procedure well.      Assessment & Plan:  Dub- await EMBX, gyn ultrasound Flu vaccine

## 2011-07-01 DIAGNOSIS — A64 Unspecified sexually transmitted disease: Secondary | ICD-10-CM | POA: Diagnosis not present

## 2011-07-01 DIAGNOSIS — Z365 Encounter for antenatal screening for isoimmunization: Secondary | ICD-10-CM | POA: Diagnosis not present

## 2011-07-01 DIAGNOSIS — Z113 Encounter for screening for infections with a predominantly sexual mode of transmission: Secondary | ICD-10-CM | POA: Diagnosis not present

## 2011-07-01 DIAGNOSIS — Z7289 Other problems related to lifestyle: Secondary | ICD-10-CM | POA: Diagnosis not present

## 2011-07-01 DIAGNOSIS — Z7251 High risk heterosexual behavior: Secondary | ICD-10-CM | POA: Diagnosis not present

## 2011-07-01 DIAGNOSIS — N949 Unspecified condition associated with female genital organs and menstrual cycle: Secondary | ICD-10-CM | POA: Diagnosis not present

## 2011-07-01 DIAGNOSIS — N760 Acute vaginitis: Secondary | ICD-10-CM | POA: Diagnosis not present

## 2011-07-01 DIAGNOSIS — N938 Other specified abnormal uterine and vaginal bleeding: Secondary | ICD-10-CM | POA: Diagnosis not present

## 2011-07-01 DIAGNOSIS — Z01812 Encounter for preprocedural laboratory examination: Secondary | ICD-10-CM | POA: Diagnosis not present

## 2011-07-01 MED ORDER — INFLUENZA VIRUS VACC SPLIT PF IM SUSP
0.5000 mL | Freq: Once | INTRAMUSCULAR | Status: AC
  Administered 2011-06-30: 0.5 mL via INTRAMUSCULAR

## 2011-07-02 LAB — RPR

## 2011-07-02 LAB — HEPATITIS B SURFACE ANTIGEN: Hepatitis B Surface Ag: NEGATIVE

## 2011-07-02 LAB — HIV ANTIBODY (ROUTINE TESTING W REFLEX): HIV: REACTIVE

## 2011-07-02 LAB — HEPATITIS C ANTIBODY: HCV Ab: NEGATIVE

## 2011-07-02 LAB — GC/CHLAMYDIA PROBE AMP, URINE
Chlamydia, Swab/Urine, PCR: NEGATIVE
GC Probe Amp, Urine: NEGATIVE

## 2011-07-05 LAB — HIV 1/2 CONFIRMATION
HIV-1 antibody: NEGATIVE
HIV-2 Ab: NEGATIVE

## 2011-07-06 ENCOUNTER — Ambulatory Visit (HOSPITAL_COMMUNITY): Payer: Medicare Other | Attending: Obstetrics & Gynecology

## 2011-07-13 ENCOUNTER — Ambulatory Visit (HOSPITAL_COMMUNITY): Payer: Medicare Other | Attending: Obstetrics & Gynecology

## 2011-07-21 DIAGNOSIS — IMO0002 Reserved for concepts with insufficient information to code with codable children: Secondary | ICD-10-CM | POA: Diagnosis not present

## 2011-07-21 DIAGNOSIS — M79609 Pain in unspecified limb: Secondary | ICD-10-CM | POA: Diagnosis not present

## 2011-07-25 ENCOUNTER — Ambulatory Visit (HOSPITAL_COMMUNITY)
Admission: RE | Admit: 2011-07-25 | Discharge: 2011-07-25 | Disposition: A | Discharge status: Home / Self Care | Payer: Medicare Other | Source: Ambulatory Visit | Attending: Obstetrics & Gynecology | Admitting: Obstetrics & Gynecology

## 2011-07-25 DIAGNOSIS — N949 Unspecified condition associated with female genital organs and menstrual cycle: Secondary | ICD-10-CM | POA: Insufficient documentation

## 2011-07-25 DIAGNOSIS — N938 Other specified abnormal uterine and vaginal bleeding: Secondary | ICD-10-CM | POA: Diagnosis not present

## 2011-07-25 DIAGNOSIS — D259 Leiomyoma of uterus, unspecified: Secondary | ICD-10-CM | POA: Diagnosis not present

## 2011-07-25 DIAGNOSIS — N925 Other specified irregular menstruation: Secondary | ICD-10-CM | POA: Diagnosis not present

## 2011-07-25 DIAGNOSIS — Z01812 Encounter for preprocedural laboratory examination: Secondary | ICD-10-CM

## 2011-07-29 ENCOUNTER — Ambulatory Visit (INDEPENDENT_AMBULATORY_CARE_PROVIDER_SITE_OTHER): Payer: Medicare Other | Admitting: Obstetrics & Gynecology

## 2011-07-29 ENCOUNTER — Encounter: Payer: Self-pay | Admitting: Obstetrics & Gynecology

## 2011-07-29 ENCOUNTER — Other Ambulatory Visit (HOSPITAL_COMMUNITY)
Admission: RE | Admit: 2011-07-29 | Discharge: 2011-07-29 | Disposition: A | Discharge status: Home / Self Care | Payer: Medicare Other | Source: Ambulatory Visit | Attending: Obstetrics & Gynecology | Admitting: Obstetrics & Gynecology

## 2011-07-29 VITALS — BP 130/92 | HR 86 | Temp 97.9°F | Ht 64.0 in | Wt 187.4 lb

## 2011-07-29 DIAGNOSIS — N939 Abnormal uterine and vaginal bleeding, unspecified: Secondary | ICD-10-CM | POA: Diagnosis not present

## 2011-07-29 DIAGNOSIS — Z124 Encounter for screening for malignant neoplasm of cervix: Secondary | ICD-10-CM | POA: Insufficient documentation

## 2011-07-29 DIAGNOSIS — N938 Other specified abnormal uterine and vaginal bleeding: Secondary | ICD-10-CM

## 2011-07-29 DIAGNOSIS — N926 Irregular menstruation, unspecified: Secondary | ICD-10-CM

## 2011-07-29 NOTE — Progress Notes (Signed)
  Subjective:    Patient ID: Cindy Baker, female    DOB: 08-17-1965, 46 y.o.   MRN: [id number]  HPI  46 yo with a history of DUB. U/S shows 3 small fibroids. Her EMBX was negative for pathology. Her cervical cultures were negative. Her HBG is 14. She says her TSH [facility] at [facility] and was "back to normal".  Review of Systems    Her pap smear is due. Her mammogram is UTD and normal reportedly. Objective:   Physical Exam 1st degree uterine prolapse Small amount of blood present Pap smear done       Assessment & Plan:  Dub with stable HBG- I have given her information on Mirena and endometrial ablation. She will make an appt if and when she decides she would like one of these treatments.

## 2011-07-29 NOTE — Progress Notes (Signed)
Addended by: Lynnell Dike on: 07/29/2011 11:18 AM   Modules accepted: Orders

## 2011-08-02 ENCOUNTER — Other Ambulatory Visit: Payer: Self-pay | Admitting: Family Medicine

## 2011-08-02 ENCOUNTER — Ambulatory Visit (HOSPITAL_COMMUNITY)
Admission: RE | Admit: 2011-08-02 | Discharge: 2011-08-02 | Disposition: A | Discharge status: Home / Self Care | Payer: Medicare Other | Source: Ambulatory Visit | Attending: Family Medicine | Admitting: Family Medicine

## 2011-08-02 DIAGNOSIS — M25559 Pain in unspecified hip: Secondary | ICD-10-CM | POA: Diagnosis not present

## 2011-08-02 DIAGNOSIS — M169 Osteoarthritis of hip, unspecified: Secondary | ICD-10-CM | POA: Diagnosis not present

## 2011-08-02 DIAGNOSIS — M25859 Other specified joint disorders, unspecified hip: Secondary | ICD-10-CM | POA: Diagnosis not present

## 2012-01-24 ENCOUNTER — Other Ambulatory Visit (HOSPITAL_COMMUNITY): Payer: Self-pay | Admitting: Internal Medicine

## 2012-01-24 DIAGNOSIS — M545 Low back pain, unspecified: Secondary | ICD-10-CM | POA: Diagnosis not present

## 2012-01-24 DIAGNOSIS — M549 Dorsalgia, unspecified: Secondary | ICD-10-CM

## 2012-01-24 DIAGNOSIS — E785 Hyperlipidemia, unspecified: Secondary | ICD-10-CM | POA: Diagnosis not present

## 2012-01-24 DIAGNOSIS — G894 Chronic pain syndrome: Secondary | ICD-10-CM | POA: Diagnosis not present

## 2012-01-24 DIAGNOSIS — E119 Type 2 diabetes mellitus without complications: Secondary | ICD-10-CM | POA: Diagnosis not present

## 2012-01-24 DIAGNOSIS — M5430 Sciatica, unspecified side: Secondary | ICD-10-CM

## 2012-01-27 ENCOUNTER — Ambulatory Visit (HOSPITAL_COMMUNITY)
Admission: RE | Admit: 2012-01-27 | Discharge: 2012-01-27 | Disposition: A | Discharge status: Home / Self Care | Payer: Medicare Other | Source: Ambulatory Visit | Attending: Internal Medicine | Admitting: Internal Medicine

## 2012-01-27 DIAGNOSIS — M79609 Pain in unspecified limb: Secondary | ICD-10-CM | POA: Insufficient documentation

## 2012-01-27 DIAGNOSIS — M47817 Spondylosis without myelopathy or radiculopathy, lumbosacral region: Secondary | ICD-10-CM | POA: Diagnosis not present

## 2012-01-27 DIAGNOSIS — M5430 Sciatica, unspecified side: Secondary | ICD-10-CM

## 2012-01-27 DIAGNOSIS — G8929 Other chronic pain: Secondary | ICD-10-CM | POA: Diagnosis not present

## 2012-01-27 DIAGNOSIS — M549 Dorsalgia, unspecified: Secondary | ICD-10-CM | POA: Diagnosis not present

## 2012-01-27 DIAGNOSIS — R209 Unspecified disturbances of skin sensation: Secondary | ICD-10-CM | POA: Diagnosis not present

## 2012-01-27 DIAGNOSIS — M5126 Other intervertebral disc displacement, lumbar region: Secondary | ICD-10-CM | POA: Diagnosis not present

## 2012-02-16 DIAGNOSIS — K589 Irritable bowel syndrome without diarrhea: Secondary | ICD-10-CM | POA: Diagnosis not present

## 2012-02-16 DIAGNOSIS — M549 Dorsalgia, unspecified: Secondary | ICD-10-CM | POA: Diagnosis not present

## 2012-02-16 DIAGNOSIS — E663 Overweight: Secondary | ICD-10-CM | POA: Diagnosis not present

## 2012-02-16 DIAGNOSIS — K219 Gastro-esophageal reflux disease without esophagitis: Secondary | ICD-10-CM | POA: Diagnosis not present

## 2012-03-08 DIAGNOSIS — R7309 Other abnormal glucose: Secondary | ICD-10-CM | POA: Diagnosis not present

## 2012-03-08 DIAGNOSIS — E669 Obesity, unspecified: Secondary | ICD-10-CM | POA: Diagnosis not present

## 2012-03-08 DIAGNOSIS — M130 Polyarthritis, unspecified: Secondary | ICD-10-CM | POA: Diagnosis not present

## 2012-03-08 DIAGNOSIS — E119 Type 2 diabetes mellitus without complications: Secondary | ICD-10-CM | POA: Diagnosis not present

## 2012-03-09 ENCOUNTER — Other Ambulatory Visit: Payer: Self-pay | Admitting: Family Medicine

## 2012-03-09 DIAGNOSIS — E279 Disorder of adrenal gland, unspecified: Secondary | ICD-10-CM

## 2012-03-11 ENCOUNTER — Ambulatory Visit
Admission: RE | Admit: 2012-03-11 | Discharge: 2012-03-11 | Disposition: A | Discharge status: Home / Self Care | Payer: Medicare Other | Source: Ambulatory Visit | Attending: Family Medicine | Admitting: Family Medicine

## 2012-03-11 DIAGNOSIS — E279 Disorder of adrenal gland, unspecified: Secondary | ICD-10-CM

## 2012-03-11 DIAGNOSIS — D3500 Benign neoplasm of unspecified adrenal gland: Secondary | ICD-10-CM | POA: Diagnosis not present

## 2012-03-11 MED ORDER — GADOBENATE DIMEGLUMINE 529 MG/ML IV SOLN
19.0000 mL | Freq: Once | INTRAVENOUS | Status: AC | PRN
  Administered 2012-03-11: 19 mL via INTRAVENOUS

## 2012-03-14 ENCOUNTER — Other Ambulatory Visit: Payer: Medicare Other

## 2012-03-27 DIAGNOSIS — H81399 Other peripheral vertigo, unspecified ear: Secondary | ICD-10-CM | POA: Diagnosis not present

## 2012-03-27 DIAGNOSIS — G541 Lumbosacral plexus disorders: Secondary | ICD-10-CM | POA: Diagnosis not present

## 2012-03-27 DIAGNOSIS — M542 Cervicalgia: Secondary | ICD-10-CM | POA: Diagnosis not present

## 2012-03-27 DIAGNOSIS — R269 Unspecified abnormalities of gait and mobility: Secondary | ICD-10-CM | POA: Diagnosis not present

## 2012-03-27 DIAGNOSIS — Z79899 Other long term (current) drug therapy: Secondary | ICD-10-CM | POA: Diagnosis not present

## 2012-04-16 DIAGNOSIS — E782 Mixed hyperlipidemia: Secondary | ICD-10-CM | POA: Diagnosis not present

## 2012-04-16 DIAGNOSIS — Z23 Encounter for immunization: Secondary | ICD-10-CM | POA: Diagnosis not present

## 2012-04-16 DIAGNOSIS — E119 Type 2 diabetes mellitus without complications: Secondary | ICD-10-CM | POA: Diagnosis not present

## 2012-04-16 DIAGNOSIS — R209 Unspecified disturbances of skin sensation: Secondary | ICD-10-CM | POA: Diagnosis not present

## 2012-04-16 DIAGNOSIS — M549 Dorsalgia, unspecified: Secondary | ICD-10-CM | POA: Diagnosis not present

## 2012-05-29 DIAGNOSIS — M5126 Other intervertebral disc displacement, lumbar region: Secondary | ICD-10-CM | POA: Diagnosis not present

## 2012-05-29 DIAGNOSIS — M549 Dorsalgia, unspecified: Secondary | ICD-10-CM | POA: Diagnosis not present

## 2012-06-01 ENCOUNTER — Ambulatory Visit (INDEPENDENT_AMBULATORY_CARE_PROVIDER_SITE_OTHER): Payer: Medicare Other | Admitting: Obstetrics & Gynecology

## 2012-06-01 ENCOUNTER — Encounter: Payer: Self-pay | Admitting: Obstetrics & Gynecology

## 2012-06-01 VITALS — BP 123/81 | HR 92 | Temp 98.0°F | Ht 63.0 in | Wt 191.3 lb

## 2012-06-01 DIAGNOSIS — N938 Other specified abnormal uterine and vaginal bleeding: Secondary | ICD-10-CM | POA: Diagnosis not present

## 2012-06-01 DIAGNOSIS — N925 Other specified irregular menstruation: Secondary | ICD-10-CM | POA: Diagnosis not present

## 2012-06-01 DIAGNOSIS — N949 Unspecified condition associated with female genital organs and menstrual cycle: Secondary | ICD-10-CM

## 2012-06-01 MED ORDER — MEGESTROL ACETATE 20 MG PO TABS: 40.0000 mg | ORAL_TABLET | Freq: Every day | ORAL | Status: DC

## 2012-06-01 NOTE — Addendum Note (Signed)
Addended by: Toula Moos on: 06/01/2012 10:12 AM   Modules accepted: Orders

## 2012-06-01 NOTE — Patient Instructions (Addendum)
Dysfunctional Uterine Bleeding  Normally, menstrual periods begin between ages 11 to 17 in young women. A normal menstrual cycle/period may begin every 23 days up to 35 days and lasts from 1 to 7 days. Around 12 to 14 days before your menstrual period starts, ovulation (ovary produces an egg) occurs. When counting the time between menstrual periods, count from the first day of bleeding of the previous period to the first day of bleeding of the next period.  Dysfunctional (abnormal) uterine bleeding is bleeding that is different from a normal menstrual period. Your periods may come earlier or later than usual. They may be lighter, have blood clots or be heavier. You may have bleeding between periods, or you may skip one period or more. You may have bleeding after sexual intercourse, bleeding after menopause, or no menstrual period.  CAUSES   · Pregnancy (normal, miscarriage, tubal).  · IUDs (intrauterine device, birth control).  · Birth control pills.  · Hormone treatment.  · Menopause.  · Infection of the cervix.  · Blood clotting problems.  · Infection of the inside lining of the uterus.  · Endometriosis, inside lining of the uterus growing in the pelvis and other female organs.  · Adhesions (scar tissue) inside the uterus.  · Obesity or severe weight loss.  · Uterine polyps inside the uterus.  · Cancer of the vagina, cervix, or uterus.  · Ovarian cysts or polycystic ovary syndrome.  · Medical problems (diabetes, thyroid disease).  · Uterine fibroids (noncancerous tumor).  · Problems with your female hormones.  · Endometrial hyperplasia, very thick lining and enlarged cells inside of the uterus.  · Medicines that interfere with ovulation.  · Radiation to the pelvis or abdomen.  · Chemotherapy.  DIAGNOSIS   · Your doctor will discuss the history of your menstrual periods, medicines you are taking, changes in your weight, stress in your life, and any medical problems you may have.  · Your doctor will do a physical  and pelvic examination.  · Your doctor may want to perform certain tests to make a diagnosis, such as:  · Pap test.  · Blood tests.  · Cultures for infection.  · CT scan.  · Ultrasound.  · Hysteroscopy.  · Laparoscopy.  · MRI.  · Hysterosalpingography.  · D and C.  · Endometrial biopsy.  TREATMENT   Treatment will depend on the cause of the dysfunctional uterine bleeding (DUB). Treatment may include:  · Observing your menstrual periods for a couple of months.  · Prescribing medicines for medical problems, including:  · Antibiotics.  · Hormones.  · Birth control pills.  · Removing an IUD (intrauterine device, birth control).  · Surgery:  · D and C (scrape and remove tissue from inside the uterus).  · Laparoscopy (examine inside the abdomen with a lighted tube).  · Uterine ablation (destroy lining of the uterus with electrical current, laser, heat, or freezing).  · Hysteroscopy (examine cervix and uterus with a lighted tube).  · Hysterectomy (remove the uterus).  HOME CARE INSTRUCTIONS   · If medicines were prescribed, take exactly as directed. Do not change or switch medicines without consulting your caregiver.  · Long term heavy bleeding may result in iron deficiency. Your caregiver may have prescribed iron pills. They help replace the iron that your body lost from heavy bleeding. Take exactly as directed.  · Do not take aspirin or medicines that contain aspirin one week before or during your menstrual period. Aspirin may make   the bleeding worse.  · If you need to change your sanitary pad or tampon more than once every 2 hours, stay in bed with your feet elevated and a cold pack on your lower abdomen. Rest as much as possible, until the bleeding stops or slows down.  · Eat well-balanced meals. Eat foods high in iron. Examples are:  · Leafy green vegetables.  · Whole-grain breads and cereals.  · Eggs.  · Meat.  · Liver.  · Do not try to lose weight until the abnormal bleeding has stopped and your blood iron level is  back to normal. Do not lift more than ten pounds or do strenuous activities when you are bleeding.  · For a couple of months, make note on your calendar, marking the start and ending of your period, and the type of bleeding (light, medium, heavy, spotting, clots or missed periods). This is for your caregiver to better evaluate your problem.  SEEK MEDICAL CARE IF:   · You develop nausea (feeling sick to your stomach) and vomiting, dizziness, or diarrhea while you are taking your medicine.  · You are getting lightheaded or weak.  · You have any problems that may be related to the medicine you are taking.  · You develop pain with your DUB.  · You want to remove your IUD.  · You want to stop or change your birth control pills or hormones.  · You have any type of abnormal bleeding mentioned above.  · You are over 16 years old and have not had a menstrual period yet.  · You are 46 years old and you are still having menstrual periods.  · You have any of the symptoms mentioned above.  · You develop a rash.  SEEK IMMEDIATE MEDICAL CARE IF:   · An oral temperature above 102° F (38.9° C) develops.  · You develop chills.  · You are changing your sanitary pad or tampon more than once an hour.  · You develop abdominal pain.  · You pass out or faint.  Document Released: 06/03/2000 Document Revised: 08/29/2011 Document Reviewed: 05/05/2009  Exit[employer]® Patient Information ©2013 [employer].

## 2012-06-01 NOTE — Progress Notes (Signed)
Subjective:     Patient ID: Cindy Baker, female   DOB: Oct 15, 1965, 46 y.o.   MRN: [id number]  HPI Pt c/o 21 days of bleeding.  Pt denies new onset of pain.  Pt had similar sx last year and had an Endo bx.  She reports that her periods became somewhat regular then she started with this episode of bleeding 1 month prev.  Pt denies new sexual partner.  She denies abnormal discharge.       Review of Systems     Objective:   Physical ExamBP 123/81  Pulse 92  Temp 98 F (36.7 C)  Ht 5\' 3"  (1.6 m)  Wt 191 lb 4.8 oz (86.773 kg)  BMI 33.89 kg/m2  LMP 05/04/2012 Pt in NAD  GU: EGBUS: no lesions Vagina:+ blood in vault- min Cervix: no lesion; no mucopurulent d/c Uterus: 12 week sized; mobile Adnexa: no masses; non tender   06/30/11 Endo bx Diagnosis Endometrium, biopsy - INACTIVE ENDOMETRIUM WITH EXTENSIVE BREAKDOWN AND DEGENERATIVE CHANGES. - NO HYPERPLASIA OR CARCINOMA.  07/25/11 sono Findings:  Uterus: The uterus is 9.8 x 6.8 x 6.9 cm. Multiple uterine  fibroids are present. Posteriorly, fibroids measure 3.6 x 2.5 x  3.3 cm and 2.9 x 3.0 x 2.0 cm.  Endometrium: 6.4 mm  Right ovary: The 2.7 x 2.0 x 2.3 cm. Normal appearance.  Left ovary: 3.6 x 1.8 x 2.1 cm. Normal appearance.  Other findings: No free fluid  IMPRESSION:  1. Posterior uterine fibroids.  2. Normal-appearing ovaries.       Assessment:     DUB- d/w pt tx options including ablation and Mirena IUS.  Pt want to try the Mirena (decided at the end of the visit)     Plan:     Megace 40mg  q day F/u for Mirena Pt encouraged to make appt with her primary care physician

## 2012-06-21 ENCOUNTER — Ambulatory Visit (HOSPITAL_COMMUNITY): Admission: RE | Admit: 2012-06-21 | Payer: Medicare Other | Source: Ambulatory Visit

## 2012-06-25 ENCOUNTER — Ambulatory Visit: Payer: Medicare Other | Admitting: Obstetrics and Gynecology

## 2012-06-29 DIAGNOSIS — M549 Dorsalgia, unspecified: Secondary | ICD-10-CM | POA: Diagnosis not present

## 2012-06-29 DIAGNOSIS — E785 Hyperlipidemia, unspecified: Secondary | ICD-10-CM | POA: Diagnosis not present

## 2012-06-29 DIAGNOSIS — E119 Type 2 diabetes mellitus without complications: Secondary | ICD-10-CM | POA: Diagnosis not present

## 2012-07-03 ENCOUNTER — Other Ambulatory Visit: Payer: Self-pay | Admitting: Orthopedic Surgery

## 2012-07-05 ENCOUNTER — Ambulatory Visit: Payer: Medicare Other | Admitting: Obstetrics & Gynecology

## 2012-07-06 ENCOUNTER — Ambulatory Visit: Payer: Medicare Other | Admitting: Obstetrics & Gynecology

## 2012-07-12 ENCOUNTER — Encounter (HOSPITAL_COMMUNITY): Payer: Self-pay | Admitting: Pharmacy Technician

## 2012-07-13 ENCOUNTER — Other Ambulatory Visit (HOSPITAL_COMMUNITY): Payer: Self-pay | Admitting: *Deleted

## 2012-07-16 ENCOUNTER — Inpatient Hospital Stay (HOSPITAL_COMMUNITY): Admission: RE | Admit: 2012-07-16 | Payer: Medicare Other | Source: Ambulatory Visit

## 2012-07-18 NOTE — Patient Instructions (Signed)
LIANAH PEED  07/18/2012   Your procedure is scheduled on:  07/25/12  Report to [facility] at 0630 AM.  Call this number if you have problems the morning of surgery: [phone]   Remember:   Do not eat food or drink liquids after midnight.   Take these medicines the morning of surgery with A SIP OF WATER:    Do not wear jewelry, make-up or nail polish.  Do not wear lotions, powders, or perfumes.   Do not shave 48 hours prior to surgery.   Do not bring valuables to the hospital.  Contacts, dentures or bridgework may not be worn into surgery.  Leave suitcase in the car. After surgery it may be brought to your room.  For patients admitted to the hospital, checkout time is 11:00 AM the day of  discharge.              SEE CHG INSTRUCTION SHEET    Please read over the following fact sheets that you were given: MRSA Information, coughing and deep breathing exercises, leg exercises.                Failure to comply with these instructions may result in cancellation of your surgery.                Patient Signature ______________________              Nurse Signature ______________________

## 2012-07-19 ENCOUNTER — Encounter (HOSPITAL_COMMUNITY): Payer: Self-pay

## 2012-07-19 ENCOUNTER — Ambulatory Visit (HOSPITAL_COMMUNITY): Admission: RE | Admit: 2012-07-19 | Payer: Medicare Other | Source: Ambulatory Visit

## 2012-07-19 ENCOUNTER — Encounter (HOSPITAL_COMMUNITY)
Admission: RE | Admit: 2012-07-19 | Discharge: 2012-07-19 | Disposition: A | Discharge status: Home / Self Care | Payer: Medicare Other | Source: Ambulatory Visit | Attending: Specialist | Admitting: Specialist

## 2012-07-19 DIAGNOSIS — M5137 Other intervertebral disc degeneration, lumbosacral region: Secondary | ICD-10-CM | POA: Diagnosis not present

## 2012-07-19 DIAGNOSIS — M5126 Other intervertebral disc displacement, lumbar region: Secondary | ICD-10-CM | POA: Diagnosis not present

## 2012-07-19 DIAGNOSIS — E785 Hyperlipidemia, unspecified: Secondary | ICD-10-CM | POA: Diagnosis not present

## 2012-07-19 DIAGNOSIS — Z79899 Other long term (current) drug therapy: Secondary | ICD-10-CM | POA: Diagnosis not present

## 2012-07-19 DIAGNOSIS — Z01810 Encounter for preprocedural cardiovascular examination: Secondary | ICD-10-CM | POA: Diagnosis not present

## 2012-07-19 DIAGNOSIS — IMO0001 Reserved for inherently not codable concepts without codable children: Secondary | ICD-10-CM | POA: Diagnosis not present

## 2012-07-19 DIAGNOSIS — I10 Essential (primary) hypertension: Secondary | ICD-10-CM | POA: Diagnosis not present

## 2012-07-19 DIAGNOSIS — F17200 Nicotine dependence, unspecified, uncomplicated: Secondary | ICD-10-CM | POA: Diagnosis not present

## 2012-07-19 DIAGNOSIS — E119 Type 2 diabetes mellitus without complications: Secondary | ICD-10-CM | POA: Diagnosis not present

## 2012-07-19 DIAGNOSIS — Z01812 Encounter for preprocedural laboratory examination: Secondary | ICD-10-CM | POA: Diagnosis not present

## 2012-07-19 HISTORY — DX: Urinary tract infection, site not specified: N39.0

## 2012-07-19 HISTORY — DX: Major depressive disorder, single episode, unspecified: F32.9

## 2012-07-19 HISTORY — DX: Depression, unspecified: F32.A

## 2012-07-19 HISTORY — DX: Anxiety disorder, unspecified: F41.9

## 2012-07-19 HISTORY — DX: Shortness of breath: R06.02

## 2012-07-19 HISTORY — DX: Anemia, unspecified: D64.9

## 2012-07-19 HISTORY — DX: Essential (primary) hypertension: I10

## 2012-07-19 LAB — CBC
HCT: 38.8 % (ref 36.0–46.0)
Hemoglobin: 12.3 g/dL (ref 12.0–15.0)
MCH: 26.5 pg (ref 26.0–34.0)
MCHC: 31.7 g/dL (ref 30.0–36.0)
MCV: 83.4 fL (ref 78.0–100.0)
Platelets: 600 10*3/uL — ABNORMAL HIGH (ref 150–400)
RBC: 4.65 MIL/uL (ref 3.87–5.11)
RDW: 15 % (ref 11.5–15.5)
WBC: 8.6 10*3/uL (ref 4.0–10.5)

## 2012-07-19 LAB — BASIC METABOLIC PANEL
BUN: 15 mg/dL (ref 6–23)
CO2: 24 mEq/L (ref 19–32)
Calcium: 9.3 mg/dL (ref 8.4–10.5)
Chloride: 100 mEq/L (ref 96–112)
Creatinine, Ser: 0.83 mg/dL (ref 0.50–1.10)
GFR calc Af Amer: >90 mL/min (ref >90)
GFR calc non Af Amer: 85 mL/min — ABNORMAL LOW (ref >90)
Glucose, Bld: 141 mg/dL — ABNORMAL HIGH (ref 70–99)
Potassium: 4.1 mEq/L (ref 3.5–5.1)
Sodium: 135 mEq/L (ref 135–145)

## 2012-07-19 LAB — SURGICAL PCR SCREEN
MRSA, PCR: NEGATIVE
Staphylococcus aureus: POSITIVE — AB

## 2012-07-19 LAB — HCG, SERUM, QUALITATIVE: Preg, Serum: NEGATIVE

## 2012-07-19 NOTE — Progress Notes (Signed)
Faxed via EPIC pcr results to Dr Shelle Iron.

## 2012-07-19 NOTE — Progress Notes (Signed)
Patient reports gum infection.  Instructed patient to inform Dr Shelle Iron.Patient voiced understanding.    Patient asked regarding what vitamins to stop prior to surgery.  I instructed patient that was a question for Dr Shelle Iron.  Patient Voiced understanding.

## 2012-07-20 ENCOUNTER — Other Ambulatory Visit: Payer: Self-pay | Admitting: Orthopedic Surgery

## 2012-07-25 ENCOUNTER — Inpatient Hospital Stay (HOSPITAL_COMMUNITY): Payer: Medicare Other

## 2012-07-25 ENCOUNTER — Encounter (HOSPITAL_COMMUNITY): Payer: Self-pay | Admitting: *Deleted

## 2012-07-25 ENCOUNTER — Encounter (HOSPITAL_COMMUNITY): Payer: Self-pay

## 2012-07-25 ENCOUNTER — Ambulatory Visit (HOSPITAL_COMMUNITY)
Admission: RE | Admit: 2012-07-25 | Discharge: 2012-07-26 | Disposition: A | Discharge status: Home / Self Care | Payer: Medicare Other | Source: Ambulatory Visit | Attending: Specialist | Admitting: Specialist

## 2012-07-25 ENCOUNTER — Encounter (HOSPITAL_COMMUNITY)
Admission: RE | Disposition: A | Discharge status: Home / Self Care | Payer: Self-pay | Source: Ambulatory Visit | Attending: Specialist

## 2012-07-25 ENCOUNTER — Inpatient Hospital Stay (HOSPITAL_COMMUNITY): Payer: Medicare Other | Admitting: *Deleted

## 2012-07-25 DIAGNOSIS — E119 Type 2 diabetes mellitus without complications: Secondary | ICD-10-CM | POA: Diagnosis not present

## 2012-07-25 DIAGNOSIS — M48061 Spinal stenosis, lumbar region without neurogenic claudication: Secondary | ICD-10-CM | POA: Diagnosis not present

## 2012-07-25 DIAGNOSIS — M519 Unspecified thoracic, thoracolumbar and lumbosacral intervertebral disc disorder: Secondary | ICD-10-CM | POA: Diagnosis not present

## 2012-07-25 DIAGNOSIS — M5126 Other intervertebral disc displacement, lumbar region: Principal | ICD-10-CM

## 2012-07-25 DIAGNOSIS — E785 Hyperlipidemia, unspecified: Secondary | ICD-10-CM | POA: Diagnosis not present

## 2012-07-25 DIAGNOSIS — I10 Essential (primary) hypertension: Secondary | ICD-10-CM | POA: Insufficient documentation

## 2012-07-25 DIAGNOSIS — IMO0001 Reserved for inherently not codable concepts without codable children: Secondary | ICD-10-CM | POA: Insufficient documentation

## 2012-07-25 DIAGNOSIS — Z01818 Encounter for other preprocedural examination: Secondary | ICD-10-CM | POA: Diagnosis not present

## 2012-07-25 DIAGNOSIS — R0602 Shortness of breath: Secondary | ICD-10-CM | POA: Diagnosis not present

## 2012-07-25 DIAGNOSIS — M5146 Schmorl's nodes, lumbar region: Secondary | ICD-10-CM | POA: Diagnosis not present

## 2012-07-25 DIAGNOSIS — N938 Other specified abnormal uterine and vaginal bleeding: Secondary | ICD-10-CM

## 2012-07-25 DIAGNOSIS — Z01812 Encounter for preprocedural laboratory examination: Secondary | ICD-10-CM | POA: Insufficient documentation

## 2012-07-25 DIAGNOSIS — M51379 Other intervertebral disc degeneration, lumbosacral region without mention of lumbar back pain or lower extremity pain: Secondary | ICD-10-CM | POA: Insufficient documentation

## 2012-07-25 DIAGNOSIS — Z01810 Encounter for preprocedural cardiovascular examination: Secondary | ICD-10-CM | POA: Insufficient documentation

## 2012-07-25 DIAGNOSIS — M5137 Other intervertebral disc degeneration, lumbosacral region: Secondary | ICD-10-CM | POA: Insufficient documentation

## 2012-07-25 DIAGNOSIS — F17200 Nicotine dependence, unspecified, uncomplicated: Secondary | ICD-10-CM | POA: Insufficient documentation

## 2012-07-25 DIAGNOSIS — Z79899 Other long term (current) drug therapy: Secondary | ICD-10-CM | POA: Insufficient documentation

## 2012-07-25 HISTORY — DX: Disorder of gingiva and edentulous alveolar ridge, unspecified: K06.9

## 2012-07-25 HISTORY — PX: LUMBAR LAMINECTOMY/DECOMPRESSION MICRODISCECTOMY: SHX5026

## 2012-07-25 LAB — GLUCOSE, CAPILLARY
Glucose-Capillary: 127 mg/dL — ABNORMAL HIGH (ref 70–99)
Glucose-Capillary: 129 mg/dL — ABNORMAL HIGH (ref 70–99)
Glucose-Capillary: 103 mg/dL — ABNORMAL HIGH (ref 70–99)
Glucose-Capillary: 162 mg/dL — ABNORMAL HIGH (ref 70–99)
Glucose-Capillary: 123 mg/dL — ABNORMAL HIGH (ref 70–99)

## 2012-07-25 SURGERY — LUMBAR LAMINECTOMY/DECOMPRESSION MICRODISCECTOMY 1 LEVEL
Anesthesia: General | Wound class: Clean

## 2012-07-25 MED ORDER — LACTATED RINGERS IV SOLN
INTRAVENOUS | Status: DC | PRN
  Administered 2012-07-25 (×3): via INTRAVENOUS

## 2012-07-25 MED ORDER — CISATRACURIUM BESYLATE (PF) 10 MG/5ML IV SOLN
INTRAVENOUS | Status: DC | PRN
  Administered 2012-07-25: 6 mg via INTRAVENOUS
  Administered 2012-07-25: 4 mg via INTRAVENOUS

## 2012-07-25 MED ORDER — HYDROCODONE-ACETAMINOPHEN 7.5-325 MG PO TABS: 1.0000 | ORAL_TABLET | ORAL | Status: DC | PRN

## 2012-07-25 MED ORDER — BUPIVACAINE-EPINEPHRINE 0.5% -1:200000 IJ SOLN
INTRAMUSCULAR | Status: DC | PRN
  Administered 2012-07-25: 10 mL

## 2012-07-25 MED ORDER — FENTANYL CITRATE 0.05 MG/ML IJ SOLN
INTRAMUSCULAR | Status: DC | PRN
  Administered 2012-07-25: 100 ug via INTRAVENOUS
  Administered 2012-07-25 (×3): 50 ug via INTRAVENOUS

## 2012-07-25 MED ORDER — DOCUSATE SODIUM 100 MG PO CAPS
100.0000 mg | ORAL_CAPSULE | Freq: Two times a day (BID) | ORAL | Status: DC
  Administered 2012-07-25 – 2012-07-26 (×2): 100 mg via ORAL

## 2012-07-25 MED ORDER — CEFAZOLIN SODIUM-DEXTROSE 2-3 GM-% IV SOLR
2.0000 g | INTRAVENOUS | Status: AC
  Administered 2012-07-25: 2 g via INTRAVENOUS

## 2012-07-25 MED ORDER — DEXTROSE 5 % IV SOLN
INTRAVENOUS | Status: DC | PRN
  Administered 2012-07-25: 09:00:00 via INTRAVENOUS

## 2012-07-25 MED ORDER — NICOTINE 7 MG/24HR TD PT24
7.0000 mg | MEDICATED_PATCH | Freq: Every day | TRANSDERMAL | Status: DC
  Administered 2012-07-25 – 2012-07-26 (×2): 7 mg via TRANSDERMAL
  Filled 2012-07-25 (×4): qty 1

## 2012-07-25 MED ORDER — ACETAMINOPHEN 650 MG RE SUPP: 650.0000 mg | RECTAL | Status: DC | PRN

## 2012-07-25 MED ORDER — 0.9 % SODIUM CHLORIDE (POUR BTL) OPTIME
TOPICAL | Status: DC | PRN
  Administered 2012-07-25: 1000 mL

## 2012-07-25 MED ORDER — LACTATED RINGERS IV SOLN: INTRAVENOUS | Status: DC

## 2012-07-25 MED ORDER — METFORMIN HCL 500 MG PO TABS
500.0000 mg | ORAL_TABLET | Freq: Every day | ORAL | Status: DC
  Administered 2012-07-26: 500 mg via ORAL
  Filled 2012-07-25 (×2): qty 1

## 2012-07-25 MED ORDER — SODIUM CHLORIDE 0.9 % IJ SOLN: 3.0000 mL | INTRAMUSCULAR | Status: DC | PRN

## 2012-07-25 MED ORDER — THROMBIN 5000 UNITS EX SOLR: CUTANEOUS | Status: DC | PRN

## 2012-07-25 MED ORDER — ESCITALOPRAM OXALATE 5 MG PO TABS
5.0000 mg | ORAL_TABLET | Freq: Every day | ORAL | Status: DC
  Administered 2012-07-26: 5 mg via ORAL
  Filled 2012-07-25: qty 1

## 2012-07-25 MED ORDER — OXYCODONE-ACETAMINOPHEN 5-325 MG PO TABS
1.0000 | ORAL_TABLET | ORAL | Status: DC | PRN
  Administered 2012-07-25 – 2012-07-26 (×4): 2 via ORAL
  Filled 2012-07-25 (×4): qty 2

## 2012-07-25 MED ORDER — VITAMIN C 500 MG PO TABS
500.0000 mg | ORAL_TABLET | Freq: Every day | ORAL | Status: DC
  Administered 2012-07-26: 500 mg via ORAL
  Filled 2012-07-25: qty 1

## 2012-07-25 MED ORDER — SUCCINYLCHOLINE CHLORIDE 20 MG/ML IJ SOLN
INTRAMUSCULAR | Status: DC | PRN
  Administered 2012-07-25: 100 mg via INTRAVENOUS

## 2012-07-25 MED ORDER — ONDANSETRON HCL 4 MG/2ML IJ SOLN
INTRAMUSCULAR | Status: DC | PRN
  Administered 2012-07-25 (×2): 2 mg via INTRAVENOUS

## 2012-07-25 MED ORDER — GLYCOPYRROLATE 0.2 MG/ML IJ SOLN
INTRAMUSCULAR | Status: DC | PRN
  Administered 2012-07-25: .8 mg via INTRAVENOUS

## 2012-07-25 MED ORDER — EPHEDRINE SULFATE 50 MG/ML IJ SOLN
INTRAMUSCULAR | Status: DC | PRN
  Administered 2012-07-25: 10 mg via INTRAVENOUS

## 2012-07-25 MED ORDER — CHLORHEXIDINE GLUCONATE 4 % EX LIQD
60.0000 mL | Freq: Once | CUTANEOUS | Status: DC
  Filled 2012-07-25: qty 60

## 2012-07-25 MED ORDER — INSULIN ASPART 100 UNIT/ML ~~LOC~~ SOLN
0.0000 [IU] | Freq: Three times a day (TID) | SUBCUTANEOUS | Status: DC
  Administered 2012-07-25: 4 [IU] via SUBCUTANEOUS
  Administered 2012-07-26: 3 [IU] via SUBCUTANEOUS

## 2012-07-25 MED ORDER — METHOCARBAMOL 500 MG PO TABS: 500.0000 mg | ORAL_TABLET | Freq: Three times a day (TID) | ORAL | Status: DC

## 2012-07-25 MED ORDER — LIDOCAINE HCL (CARDIAC) 20 MG/ML IV SOLN
INTRAVENOUS | Status: DC | PRN
  Administered 2012-07-25: 75 mg via INTRAVENOUS

## 2012-07-25 MED ORDER — CRANBERRY 300 MG PO TABS: 300.0000 mg | ORAL_TABLET | Freq: Every day | ORAL | Status: DC

## 2012-07-25 MED ORDER — AMITRIPTYLINE HCL 10 MG PO TABS
10.0000 mg | ORAL_TABLET | Freq: Every day | ORAL | Status: DC
  Administered 2012-07-25: 10 mg via ORAL
  Filled 2012-07-25 (×2): qty 1

## 2012-07-25 MED ORDER — PHENOL 1.4 % MT LIQD
1.0000 | OROMUCOSAL | Status: DC | PRN
  Filled 2012-07-25: qty 177

## 2012-07-25 MED ORDER — METHOCARBAMOL 500 MG PO TABS
500.0000 mg | ORAL_TABLET | Freq: Four times a day (QID) | ORAL | Status: DC | PRN
  Administered 2012-07-25 – 2012-07-26 (×2): 500 mg via ORAL
  Filled 2012-07-25 (×2): qty 1

## 2012-07-25 MED ORDER — FAMOTIDINE 10 MG PO TABS
10.0000 mg | ORAL_TABLET | Freq: Two times a day (BID) | ORAL | Status: DC
  Administered 2012-07-25 – 2012-07-26 (×2): 10 mg via ORAL
  Filled 2012-07-25 (×3): qty 1

## 2012-07-25 MED ORDER — PROPOFOL 10 MG/ML IV EMUL
INTRAVENOUS | Status: DC | PRN
  Administered 2012-07-25: 200 mg via INTRAVENOUS

## 2012-07-25 MED ORDER — CEFAZOLIN SODIUM-DEXTROSE 2-3 GM-% IV SOLR
2.0000 g | Freq: Three times a day (TID) | INTRAVENOUS | Status: AC
  Administered 2012-07-25 (×2): 2 g via INTRAVENOUS
  Filled 2012-07-25 (×2): qty 50

## 2012-07-25 MED ORDER — MENTHOL 3 MG MT LOZG
1.0000 | LOZENGE | OROMUCOSAL | Status: DC | PRN
  Filled 2012-07-25: qty 9

## 2012-07-25 MED ORDER — ONDANSETRON HCL 4 MG/2ML IJ SOLN: 4.0000 mg | INTRAMUSCULAR | Status: DC | PRN

## 2012-07-25 MED ORDER — ACETAMINOPHEN 325 MG PO TABS: 650.0000 mg | ORAL_TABLET | ORAL | Status: DC | PRN

## 2012-07-25 MED ORDER — MIDAZOLAM HCL 5 MG/5ML IJ SOLN
INTRAMUSCULAR | Status: DC | PRN
  Administered 2012-07-25: 1 mg via INTRAVENOUS

## 2012-07-25 MED ORDER — HYDROMORPHONE HCL PF 1 MG/ML IJ SOLN
0.2500 mg | INTRAMUSCULAR | Status: DC | PRN
  Administered 2012-07-25: 0.5 mg via INTRAVENOUS

## 2012-07-25 MED ORDER — SODIUM CHLORIDE 0.9 % IV SOLN
INTRAVENOUS | Status: AC
  Administered 2012-07-25: 10:00:00 via INTRAPERITONEAL
  Filled 2012-07-25: qty 3

## 2012-07-25 MED ORDER — SODIUM CHLORIDE 0.9 % IJ SOLN
3.0000 mL | Freq: Two times a day (BID) | INTRAMUSCULAR | Status: DC
  Administered 2012-07-26: 3 mL via INTRAVENOUS

## 2012-07-25 MED ORDER — NEOSTIGMINE METHYLSULFATE 1 MG/ML IJ SOLN
INTRAMUSCULAR | Status: DC | PRN
  Administered 2012-07-25: 5 mg via INTRAVENOUS

## 2012-07-25 MED ORDER — VANCOMYCIN HCL IN DEXTROSE 1-5 GM/200ML-% IV SOLN
1000.0000 mg | INTRAVENOUS | Status: AC
  Administered 2012-07-25: 1000 mg via INTRAVENOUS

## 2012-07-25 MED ORDER — TRAMADOL HCL 50 MG PO TABS: 50.0000 mg | ORAL_TABLET | Freq: Four times a day (QID) | ORAL | Status: DC | PRN

## 2012-07-25 MED ORDER — DULOXETINE HCL 30 MG PO CPEP
30.0000 mg | ORAL_CAPSULE | Freq: Every day | ORAL | Status: DC
  Administered 2012-07-26: 30 mg via ORAL
  Filled 2012-07-25: qty 1

## 2012-07-25 MED ORDER — SODIUM CHLORIDE 0.9 % IV SOLN: 250.0000 mL | INTRAVENOUS | Status: DC

## 2012-07-25 MED ORDER — HYDROMORPHONE HCL PF 1 MG/ML IJ SOLN
0.5000 mg | INTRAMUSCULAR | Status: DC | PRN
  Administered 2012-07-25 (×2): 1 mg via INTRAVENOUS
  Filled 2012-07-25 (×2): qty 1

## 2012-07-25 MED ORDER — METHOCARBAMOL 100 MG/ML IJ SOLN
500.0000 mg | Freq: Three times a day (TID) | INTRAMUSCULAR | Status: DC | PRN
  Administered 2012-07-25: 500 mg via INTRAVENOUS
  Filled 2012-07-25: qty 5

## 2012-07-25 MED ORDER — PROMETHAZINE HCL 25 MG/ML IJ SOLN: 6.2500 mg | INTRAMUSCULAR | Status: DC | PRN

## 2012-07-25 MED ORDER — HYDROCODONE-ACETAMINOPHEN 5-325 MG PO TABS: 1.0000 | ORAL_TABLET | ORAL | Status: DC | PRN

## 2012-07-25 MED ORDER — FENOFIBRATE 54 MG PO TABS
54.0000 mg | ORAL_TABLET | Freq: Every day | ORAL | Status: DC
  Administered 2012-07-26: 54 mg via ORAL
  Filled 2012-07-25: qty 1

## 2012-07-25 MED ORDER — GABAPENTIN 300 MG PO CAPS
300.0000 mg | ORAL_CAPSULE | Freq: Three times a day (TID) | ORAL | Status: DC
  Administered 2012-07-25 – 2012-07-26 (×3): 300 mg via ORAL
  Filled 2012-07-25 (×5): qty 1

## 2012-07-25 MED ORDER — SODIUM CHLORIDE 0.45 % IV SOLN
INTRAVENOUS | Status: DC
  Administered 2012-07-25: 16:00:00 via INTRAVENOUS

## 2012-07-25 MED ORDER — THROMBIN 5000 UNITS EX SOLR
CUTANEOUS | Status: DC | PRN
  Administered 2012-07-25: 10000 [IU] via TOPICAL

## 2012-07-25 SURGICAL SUPPLY — 45 items
BAG ZIPLOCK 12X15 (MISCELLANEOUS) ×2 IMPLANT
CLEANER TIP ELECTROSURG 2X2 (MISCELLANEOUS) ×2 IMPLANT
CLOTH BEACON ORANGE TIMEOUT ST (SAFETY) ×2 IMPLANT
DECANTER SPIKE VIAL GLASS SM (MISCELLANEOUS) IMPLANT
DRAPE MICROSCOPE LEICA (MISCELLANEOUS) ×2 IMPLANT
DRAPE POUCH INSTRU U-SHP 10X18 (DRAPES) ×2 IMPLANT
DRAPE SURG 17X11 SM STRL (DRAPES) ×2 IMPLANT
DRSG AQUACEL AG ADV 3.5X 4 (GAUZE/BANDAGES/DRESSINGS) ×2 IMPLANT
DRSG PAD ABDOMINAL 8X10 ST (GAUZE/BANDAGES/DRESSINGS) IMPLANT
DURAPREP 26ML APPLICATOR (WOUND CARE) ×2 IMPLANT
ELECT BLADE TIP CTD 4 INCH (ELECTRODE) ×2 IMPLANT
ELECT REM PT RETURN 9FT ADLT (ELECTROSURGICAL) ×2
ELECTRODE REM PT RTRN 9FT ADLT (ELECTROSURGICAL) ×1 IMPLANT
GLOVE BIOGEL PI IND STRL 7.5 (GLOVE) ×1 IMPLANT
GLOVE BIOGEL PI IND STRL 8 (GLOVE) ×1 IMPLANT
GLOVE BIOGEL PI INDICATOR 7.5 (GLOVE) ×1
GLOVE BIOGEL PI INDICATOR 8 (GLOVE) ×1
GLOVE SURG SS PI 7.5 STRL IVOR (GLOVE) ×2 IMPLANT
GLOVE SURG SS PI 8.0 STRL IVOR (GLOVE) ×4 IMPLANT
GOWN PREVENTION PLUS LG XLONG (DISPOSABLE) ×2 IMPLANT
GOWN STRL REIN XL XLG (GOWN DISPOSABLE) ×4 IMPLANT
KIT BASIN OR (CUSTOM PROCEDURE TRAY) ×2 IMPLANT
KIT POSITIONING SURG ANDREWS (MISCELLANEOUS) ×2 IMPLANT
MANIFOLD NEPTUNE II (INSTRUMENTS) ×2 IMPLANT
NEEDLE SPNL 18GX3.5 QUINCKE PK (NEEDLE) ×6 IMPLANT
PATTIES SURGICAL .5 X.5 (GAUZE/BANDAGES/DRESSINGS) ×2 IMPLANT
PATTIES SURGICAL .75X.75 (GAUZE/BANDAGES/DRESSINGS) ×2 IMPLANT
PATTIES SURGICAL 1X1 (DISPOSABLE) ×2 IMPLANT
SPONGE SURGIFOAM ABS GEL 100 (HEMOSTASIS) ×2 IMPLANT
STAPLER VISISTAT (STAPLE) IMPLANT
STRIP CLOSURE SKIN 1/2X4 (GAUZE/BANDAGES/DRESSINGS) ×2 IMPLANT
SUT PROLENE 3 0 PS 2 (SUTURE) ×2 IMPLANT
SUT VIC AB 0 CT1 27 (SUTURE)
SUT VIC AB 0 CT1 27XBRD ANTBC (SUTURE) IMPLANT
SUT VIC AB 1 CT1 27 (SUTURE) ×1
SUT VIC AB 1 CT1 27XBRD ANTBC (SUTURE) ×1 IMPLANT
SUT VIC AB 1-0 CT2 27 (SUTURE) IMPLANT
SUT VIC AB 2-0 CT1 27 (SUTURE) ×1
SUT VIC AB 2-0 CT1 TAPERPNT 27 (SUTURE) ×1 IMPLANT
SUT VIC AB 2-0 CT2 27 (SUTURE) ×4 IMPLANT
SUT VICRYL 0 27 CT2 27 ABS (SUTURE) ×4 IMPLANT
SUT VICRYL 0 UR6 27IN ABS (SUTURE) IMPLANT
SYRINGE 10CC LL (SYRINGE) ×4 IMPLANT
TRAY LAMINECTOMY (CUSTOM PROCEDURE TRAY) ×2 IMPLANT
YANKAUER SUCT BULB TIP NO VENT (SUCTIONS) ×2 IMPLANT

## 2012-07-25 NOTE — Transfer of Care (Signed)
Immediate Anesthesia Transfer of Care Note  Patient: Cindy Baker  Procedure(s) Performed: Procedure(s) (LRB) with comments: LUMBAR LAMINECTOMY/DECOMPRESSION MICRODISCECTOMY 1 LEVEL (N/A) - MICRODISCECTOMY L5-S1, FORAMINOTOMY L5-S1  Patient Location: PACU  Anesthesia Type:General  Level of Consciousness: awake, alert , oriented, patient cooperative and responds to stimulation  Airway & Oxygen Therapy: Patient Spontanous Breathing and Patient connected to face mask oxygen  Post-op Assessment: Report given to PACU RN  Post vital signs: Reviewed and stable  Complications: No apparent anesthesia complications

## 2012-07-25 NOTE — Anesthesia Preprocedure Evaluation (Addendum)
Anesthesia Evaluation  Patient identified by MRN, date of birth, ID band Patient awake    Reviewed: Allergy & Precautions, H&P , NPO status , Patient's Chart, lab work & pertinent test results  Airway Mallampati: II TM Distance: >3 FB Neck ROM: Full    Dental No notable dental hx.    Pulmonary shortness of breath,  breath sounds clear to auscultation  Pulmonary exam normal       Cardiovascular hypertension, Rhythm:Regular Rate:Normal     Neuro/Psych PSYCHIATRIC DISORDERS Anxiety Depression Patient reports tremor in her right arm. Dr. Shelle Iron aware.  Neuromuscular disease    GI/Hepatic Neg liver ROS, GERD-  Medicated,  Endo/Other  diabetes, Type 2, Oral Hypoglycemic AgentsHypothyroidism   Renal/GU negative Renal ROS  negative genitourinary   Musculoskeletal negative musculoskeletal ROS (+) Fibromyalgia -  Abdominal (+) + obese,   Peds negative pediatric ROS (+)  Hematology negative hematology ROS (+)   Anesthesia Other Findings   Reproductive/Obstetrics Negative pregnancy test.                          Anesthesia Physical Anesthesia Plan  ASA: II  Anesthesia Plan: General   Post-op Pain Management:    Induction: Intravenous  Airway Management Planned: Oral ETT  Additional Equipment:   Intra-op Plan:   Post-operative Plan: Extubation in OR  Informed Consent: I have reviewed the patients History and Physical, chart, labs and discussed the procedure including the risks, benefits and alternatives for the proposed anesthesia with the patient or authorized representative who has indicated his/her understanding and acceptance.   Dental advisory given  Plan Discussed with: CRNA  Anesthesia Plan Comments:        Anesthesia Quick Evaluation

## 2012-07-25 NOTE — H&P (Signed)
Cindy Baker is an 47 y.o. female.   Chief Complaint: back and leg pain HPI: back and right leg pain (buttock, posterior leg to foot), weakness, numbness, refractory to conservative tx  Past Medical History  Diagnosis Date  . Hyperlipidemia   . Fibromyalgia   . Arthritis     osteoarthritis  . Abnormal Pap smear of cervix   . Diabetes mellitus without complication   . Adrenal nodule   . Hypertension     on no meds   . Anxiety   . Depression   . Shortness of breath     related to fibromyalgia  . UTI (lower urinary tract infection)   . Anemia   . Chronic gum disease     Past Surgical History  Procedure Date  . Tubal ligation   . Tympanoplasty   . Adenoidectomy   . Dental surgery     Family History  Problem Relation Age of Onset  . Cancer Paternal Aunt     cervical  . Cancer Maternal Grandmother     breast  . Cancer Paternal Grandfather     lung   Social History:  reports that she has been smoking Cigarettes.  She has a 13 pack-year smoking history. She has never used smokeless tobacco. She reports that she drinks alcohol. She reports that she does not use illicit drugs.  Allergies:  Allergies  Allergen Reactions  . Neomycin Other (See Comments)    Eyes swelling    Medications Prior to Admission  Medication Sig Dispense Refill  . amitriptyline (ELAVIL) 10 MG tablet Take 10 mg by mouth at bedtime.       . Cetylpyridinium Chloride (ANTISEPTIC ORAL RINSE) 0.05 % LIQD Use as directed in the mouth or throat 2 (two) times daily.      . cholecalciferol (VITAMIN D) 400 UNITS TABS Take 400 Units by mouth daily.      . Cranberry (SM CRANBERRY) 300 MG tablet Take 300 mg by mouth daily.      . diclofenac (VOLTAREN) 75 MG EC tablet Take 75 mg by mouth 2 (two) times daily.      . DULoxetine (CYMBALTA) 30 MG capsule Take 30 mg by mouth daily before breakfast.       . escitalopram (LEXAPRO) 5 MG tablet Take 5 mg by mouth daily before breakfast.       . Fenofibrate 150 MG  CAPS Take 1 capsule by mouth daily before breakfast. Patient takes with largest meal      . gabapentin (NEURONTIN) 300 MG capsule Take 300 mg by mouth 3 (three) times daily.      . metFORMIN (GLUCOPHAGE) 500 MG tablet Take 500 mg by mouth daily with breakfast.      . Multiple Vitamin (MULTIVITAMIN WITH MINERALS) TABS Take 1 tablet by mouth daily.      . Omega-3 Fatty Acids (FISH OIL) 1200 MG CAPS Take 1 capsule by mouth daily.      . ranitidine (ZANTAC) 150 MG capsule Take 150 mg by mouth at bedtime.       . traMADol (ULTRAM) 50 MG tablet Take 50 mg by mouth every 6 (six) hours as needed. Pain      . vitamin C (ASCORBIC ACID) 500 MG tablet Take 500 mg by mouth daily.        Results for orders placed during the hospital encounter of 07/25/12 (from the past 48 hour(s))  GLUCOSE, CAPILLARY     Status: Abnormal   Collection Time  07/25/12  7:51 AM      Component Value Range Comment   Glucose-Capillary 127 (*) 70 - 99 mg/dL    Dg Chest 2 View  06/25/1094  *RADIOLOGY REPORT*  Clinical Data: Preop lumbar laminectomy  CHEST - 2 VIEW  Comparison: None  Findings: Heart size is normal.  Pulmonary vascularity is normal. Lungs are clear without infiltrate or effusion.  No mass lesion. No focal bony abnormality.  IMPRESSION: Normal chest x-ray.   Original Report Authenticated By: Janeece Riggers, M.D.    X-ray Lumbar Spine Ap And Lateral  07/25/2012  *RADIOLOGY REPORT*  Clinical Data: Preop lumbar laminectomy  LUMBAR SPINE - 2-3 VIEW  Comparison: Lumbar MRI 01/27/2012  Findings: Five  lumbar segments are present.  2 mm anterior slip L4-5.  Disc spaces are maintained.  No fracture or mass is identified.  SI joints are normal.  Lumbar vertebra were labeled for operative localization. Mild Schmorl's node at L1-2.  IMPRESSION: Five lumbar segments.  Mild anterior slip L4-5.  No acute bony abnormality.   Original Report Authenticated By: Janeece Riggers, M.D.     Review of Systems  Constitutional: Negative.   HENT:  Negative.   Eyes: Negative.   Respiratory: Negative.   Cardiovascular: Negative.   Gastrointestinal: Negative.   Genitourinary: Negative.   Musculoskeletal: Positive for back pain.  Skin: Negative.   Neurological: Negative.   Psychiatric/Behavioral: Negative.     Blood pressure 146/91, pulse 91, temperature 99 F (37.2 C), resp. rate 20, last menstrual period 06/25/2012, SpO2 99.00%. Physical Exam  Constitutional: She is oriented to person, place, and time. She appears well-developed and well-nourished.  HENT:  Head: Normocephalic and atraumatic.  Eyes: Pupils are equal, round, and reactive to light.  Neck: Normal range of motion. Neck supple.  Cardiovascular: Normal rate and regular rhythm.   Respiratory: Effort normal and breath sounds normal.  GI: Soft. Bowel sounds are normal.  Musculoskeletal:       Moderate distress. SLR right produces buttock, thigh, calf pain, negative on left. R EHL weakness 4/5. Diminished repetitive plantar flexion. Altered sensation S1 dermatome right.  Neurological: She is alert and oriented to person, place, and time. She has normal reflexes.  Skin: Skin is warm and dry.     Assessment/Plan  MRI Lspine with large HNP L5-S1 right which displaced R S1 nerve root HNP L5-S1 Plan for microlumbar decompression L5-S1, foraminotomy L5-S1. Dr. Shelle Iron discussed risks, complications, alternatives with the patient including but not limited to DVT, PE, infx, bleeding, failure of procedure, need for secondary procedure, nerve injury, dural tear, CSF leak, anesthesia risk, even death. All questions answered and she desires to proceed. She was apparently staph positive but MRSA negative. These results were faxed to our office last week and vanco was ordered pre-op. She has a neomycin allergy (eyedrops) which Dr.Beane discussed with pharmacy, who confirmed pt can receive vanco. Will start vanco IV in addition to kefzol.  BISSELL, JACLYN M. 07/25/2012, 8:30 AM

## 2012-07-25 NOTE — Preoperative (Signed)
Beta Blockers   Reason not to administer Beta Blockers:Not Applicable 

## 2012-07-25 NOTE — Brief Op Note (Signed)
07/25/2012  10:09 AM  PATIENT:  Cindy Baker  47 y.o. female  PRE-OPERATIVE DIAGNOSIS:  HNP L5-S1  POST-OPERATIVE DIAGNOSIS:  HNP L5-S1  PROCEDURE:  Procedure(s) (LRB) with comments: LUMBAR LAMINECTOMY/DECOMPRESSION MICRODISCECTOMY 1 LEVEL (N/A) - MICRODISCECTOMY L5-S1, FORAMINOTOMY L5-S1  SURGEON:  Surgeon(s) and Role:    * Javier Docker, MD - Primary  PHYSICIAN ASSISTANT:   ASSISTANTS: Bissell   ANESTHESIA:   general  EBL:  Total I/O In: 1250 [I.V.:1250] Out: 50 [Blood:50]  BLOOD ADMINISTERED:none  DRAINS: none   LOCAL MEDICATIONS USED:  MARCAINE     SPECIMEN:  No Specimen  DISPOSITION OF SPECIMEN:  N/A  COUNTS:  YES  TOURNIQUET:  * No tourniquets in log *  DICTATION: .Other Dictation: Dictation Number [id number]  PLAN OF CARE: Admit for overnight observation  PATIENT DISPOSITION:  PACU - hemodynamically stable.   Delay start of Pharmacological VTE agent (>24hrs) due to surgical blood loss or risk of bleeding: yes

## 2012-07-25 NOTE — Anesthesia Postprocedure Evaluation (Signed)
  Anesthesia Post-op Note  Patient: Cindy Baker  Procedure(s) Performed: Procedure(s) (LRB): LUMBAR LAMINECTOMY/DECOMPRESSION MICRODISCECTOMY 1 LEVEL (N/A)  Patient Location: PACU  Anesthesia Type: General  Level of Consciousness: awake and alert   Airway and Oxygen Therapy: Patient Spontanous Breathing  Post-op Pain: mild  Post-op Assessment: Post-op Vital signs reviewed, Patient's Cardiovascular Status Stable, Respiratory Function Stable, Patent Airway and No signs of Nausea or vomiting  Last Vitals:  Filed Vitals:   07/25/12 1157  BP: 120/75  Pulse: 80  Temp: 36.7 C  Resp: 12    Post-op Vital Signs: stable   Complications: No apparent anesthesia complications

## 2012-07-25 NOTE — Evaluation (Signed)
Physical Therapy Evaluation Patient Details Name: Cindy Baker MRN: [id number] DOB: 1966/05/18 Today's Date: 07/25/2012 Time: 5784-6962 PT Time Calculation (min): 27 min  PT Assessment / Plan / Recommendation Clinical Impression  s/p L5-S1 decompression; will benefit from one more PTvisit for stairs, precautions, further gait training;    PT Assessment  Patient needs continued PT services    Follow Up Recommendations  No PT follow up    Does the patient have the potential to tolerate intense rehabilitation      Barriers to Discharge        Equipment Recommendations  None recommended by PT    Recommendations for Other Services     Frequency Min 6X/week    Precautions / Restrictions Precautions Precautions: Back Precaution Booklet Issued: Yes (comment) Precaution Comments: handout given   Pertinent Vitals/Pain       Mobility  Bed Mobility Bed Mobility: Rolling Right;Sit to Sidelying Right Rolling Right: 4: Min assist;4: Min guard Sit to Sidelying Right: 4: Min assist;4: Min guard Details for Bed Mobility Assistance: cues for log roll, technique, precautions Transfers Transfers: Sit to Stand;Stand to Sit Sit to Stand: 4: Min assist;4: Min guard Stand to Sit: 4: Min assist;4: Min guard Details for Transfer Assistance: cues for back precautions and hand placement Ambulation/Gait Ambulation/Gait Assistance: 4: Min guard Ambulation Distance (Feet): 120 Feet Assistive device: 1 person hand held assist;2 person hand held assist Ambulation/Gait Assistance Details: cues for posture Gait Pattern: Step-to pattern;Narrow base of support;Step-through pattern;Decreased stride length Gait velocity: slow, guarded    Exercises General Exercises - Lower Extremity Ankle Circles/Pumps: AROM;Both;15 reps   PT Diagnosis: Difficulty walking  PT Problem List: Decreased activity tolerance;Decreased knowledge of precautions;Decreased mobility PT Treatment Interventions: Gait  training;Stair training;Functional mobility training;Therapeutic activities;Patient/family education   PT Goals Acute Rehab PT Goals PT Goal Formulation: With patient Time For Goal Achievement: 07/27/12 Potential to Achieve Goals: Good Pt will Roll Supine to Right Side: with supervision PT Goal: Rolling Supine to Right Side - Progress: Goal set today Pt will go Supine/Side to Sit: with supervision PT Goal: Supine/Side to Sit - Progress: Goal set today Pt will go Sit to Supine/Side: with supervision;with min assist PT Goal: Sit to Supine/Side - Progress: Goal set today Pt will go Sit to Stand: with supervision PT Goal: Sit to Stand - Progress: Goal set today Pt will go Stand to Sit: with supervision PT Goal: Stand to Sit - Progress: Goal set today Pt will Ambulate: 51 - 150 feet;with supervision PT Goal: Ambulate - Progress: Goal set today Pt will Go Up / Down Stairs: 1-2 stairs;with min assist PT Goal: Up/Down Stairs - Progress: Goal set today  Visit Information  Last PT Received On: 07/25/12 Assistance Needed: +1    Subjective Data  Subjective: I have been up already; Pt on EOB and stood to put on pants per NT. Patient Stated Goal: home   Prior Functioning  Home Living Lives With: Significant other Available Help at Discharge: Family Type of Home: House Home Access: Stairs to enter Secretary/administrator of Steps: 2 Home Layout: One level Home Adaptive Equipment: None Prior Function Level of Independence: Independent Able to Take Stairs?: Yes    Cognition  Cognition Overall Cognitive Status: Appears within functional limits for tasks assessed/performed Arousal/Alertness: Awake/alert Orientation Level: Appears intact for tasks assessed Behavior During Session: [facility] for tasks performed    Extremity/Trunk Assessment Right Upper Extremity Assessment RUE ROM/Strength/Tone: [facility] for tasks assessed Left Upper Extremity Assessment LUE ROM/Strength/Tone: [facility] for  tasks  assessed Right Lower Extremity Assessment RLE ROM/Strength/Tone: [facility] for tasks assessed Left Lower Extremity Assessment LLE ROM/Strength/Tone: WFL for tasks assessed LLE Sensation: Deficits LLE Sensation Deficits: numbness toes per pt report   Balance    End of Session PT - End of Session Activity Tolerance: Patient tolerated treatment well Patient left: in chair;with call bell/phone within reach;with nursing in room Nurse Communication: Mobility status  GP Functional Assessment Tool Used: clinical judgement Functional Limitation: Mobility: Walking and moving around Mobility: Walking and Moving Around Current Status (Z6109): At least 1 percent but less than 20 percent impaired, limited or restricted Mobility: Walking and Moving Around Goal Status [phone]): At least 1 percent but less than 20 percent impaired, limited or restricted   [facility] 07/25/2012, 3:50 PM

## 2012-07-25 NOTE — Op Note (Signed)
NAMESHONTERIA, ABELN             ACCOUNT NO.:  [id number]  MEDICAL RECORD NO.:  [id number]  LOCATION:  [facility]                         FACILITY:  [facility]  PHYSICIAN:  Jene Every, M.D.    DATE OF BIRTH:  17-Jul-1965  DATE OF PROCEDURE:  07/25/2012 DATE OF DISCHARGE:                              OPERATIVE REPORT   PREOPERATIVE DIAGNOSIS:  Spinal stenosis, HNP, L5-S1 right.  POSTOPERATIVE DIAGNOSIS:  Spinal stenosis, HNP, L5-S1 right.  PROCEDURE PERFORMED:  Micro lumbar decompression L5-S1, right. Foraminotomies L5-S1.  ANESTHESIA:  General.  ASSISTANT:  Lanna Poche.  HISTORY:  A 47 year old, refractory right lower extremity radicular pain secondary to disk herniation, paracentral right compressing S1 nerve root to the lateral recess.  Disk degeneration noted as well.  She had EHL weakness, neural tension signs, diminished pedal plantar flexion. She was indicated for decompression L5-S1 by microdiskectomy and decompression of the lateral recess.  Risks and benefits discussed including bleeding, infection, damage to the neurovascular structure, DVT, PE, anesthetic complications etc.  TECHNIQUE:  With the patient in supine position, after induction of adequate general anesthesia and 2 g Kefzol, 1 g vancomycin due to the patient's history of MRSA exposure, she was placed prone on the [place] frame.  All bony prominences were well padded.  Lumbar region was prepped and draped in usual sterile fashion.  An 18-gauge spinal needle was utilized, localized, 5-1 interspace confirmed with x-ray.  Incision was made from spinous process of L5-S1.  Subcutaneous tissue was dissected.  Electrocautery was utilized to achieve hemostasis. Dorsolumbar fascia identified, divided in line with skin incision. Paraspinous muscle elevated from lamina 5 and 1.  McCullough retractors were placed.  Operating microscope draped on the surgical field. Confirmatory radiograph obtained with interlaminar  space at L5-S1.  A surgical Michael curette was utilized to detach the ligamentum flavum from the cephalad edge of S1.  Hemilaminotomy of the caudad edge of 5 was performed with 2 and 3-mm Kerrison, detached the ligamentum of flavum.  Ligamentum flavum removed from the interspace.  The alignment was well protected.  We found the S1 nerve root compressing the lateral recess with severe compression in the lateral recess, multifactorial disk herniation, ligamentum flavum hypertrophy and facet hypertrophy, gently mobilized the S1 nerve root, medially protecting with a Penfield and decompressed the lateral recess to the medial border of the pedicle with 2-mm Kerrison after performing a foraminotomy of S1.  Bipolar electrocautery was utilized to achieve hemostasis.  There was a vascular least tethering of the S1 nerve root.  Also laterally this was cauterized and lysed.  Further mobilizing the S1 nerve root, we identified the disk herniation consistent with that seen on the MRI though somewhat smaller probably due to the duration of time since the MRI, which was in August.  I performed an annulotomy and copious portion of the disk material was removed from the submandibular and subposterior longitudinal ligament space with a micro pituitary and a regular pituitary further mobilizing with a nerve hook.  There was no extension distal or proximal of the disk herniation.  Following the decompression, the confirmatory radiograph was obtained with a Penfield 4 in the disk space.  We performed foraminotomies of S1 and  L5.  There was some disk degeneration noted here as well.  A minimal mild foraminal stenosis at 5 was mainly secondary to ligamentum flavum hypertrophy.  Hockey-stick probe placed up the foramen L5 and S1  following decompression.  We found them widely patent.  A 1 cm of excursion of the S1 nerve root medial pedicle without tension.  No further disk herniation, __________. We passed our  probe cephalad to the pedicle 5 and caudad below the pedicle of S1, no further compression.  Disk space was copiously irrigated with antibiotic irrigation.  Inspection revealed no CSF leakage or active bleeding.  We therefore draped epidural fat over the S1 nerve root, removed the McCullough retractor, irrigated and closed the dorsolumbar fascia with 1 Vicryl sutures subcu with 2-0, skin with 3- 0 subcuticular Prolene.  Wound reinforced with Steri-Strips.  Sterile dressing applied.  Placed supine on hospital bed, extubated without difficulty, transported to the recovery in satisfactory condition.  The patient tolerated the procedure well.  No complications.  Assistant Lanna Poche, [place].  Minimal blood loss.     Jene Every, M.D.     Cordelia Pen  D:  07/25/2012  T:  07/25/2012  Job:  [id number]

## 2012-07-26 ENCOUNTER — Encounter (HOSPITAL_COMMUNITY): Payer: Self-pay | Admitting: Specialist

## 2012-07-26 DIAGNOSIS — E785 Hyperlipidemia, unspecified: Secondary | ICD-10-CM | POA: Diagnosis not present

## 2012-07-26 DIAGNOSIS — I10 Essential (primary) hypertension: Secondary | ICD-10-CM | POA: Diagnosis not present

## 2012-07-26 DIAGNOSIS — E119 Type 2 diabetes mellitus without complications: Secondary | ICD-10-CM | POA: Diagnosis not present

## 2012-07-26 DIAGNOSIS — IMO0001 Reserved for inherently not codable concepts without codable children: Secondary | ICD-10-CM | POA: Diagnosis not present

## 2012-07-26 DIAGNOSIS — M5137 Other intervertebral disc degeneration, lumbosacral region: Secondary | ICD-10-CM | POA: Diagnosis not present

## 2012-07-26 DIAGNOSIS — M5126 Other intervertebral disc displacement, lumbar region: Secondary | ICD-10-CM | POA: Diagnosis not present

## 2012-07-26 LAB — GLUCOSE, CAPILLARY
Glucose-Capillary: 142 mg/dL — ABNORMAL HIGH (ref 70–99)
Glucose-Capillary: 101 mg/dL — ABNORMAL HIGH (ref 70–99)

## 2012-07-26 MED ORDER — NICOTINE 7 MG/24HR TD PT24: 1.0000 | MEDICATED_PATCH | Freq: Every day | TRANSDERMAL | Status: DC

## 2012-07-26 NOTE — Care Management Note (Signed)
    Page 1 of 2   07/26/2012     2:23:52 PM   CARE MANAGEMENT NOTE 07/26/2012  Patient:  PALMER, SHOREY   Account Number:  [id number]  Date Initiated:  07/26/2012  Documentation initiated by:  Colleen Can  Subjective/Objective Assessment:   dx HNP L5-S1; Micro-lumbar decompression     Action/Plan:   CM spoke with patient. Plans are for her to retrurn to her home in [place] where she will have caregiver. Will need DME. No H services are required.   Anticipated DC Date:  07/26/2012   Anticipated DC Plan:  HOME/SELF CARE      DC Planning Services  CM consult      PAC Choice  DURABLE MEDICAL EQUIPMENT   Choice offered to / List presented to:  NA   DME arranged  3-N-1  WALKER - ROLLING  OTHER - SEE COMMENT      DME agency  [facility]     HH arranged  NA      HH agency  NA   Status of service:  Completed, signed off Medicare Important Message given?  NA - LOS <3 / Initial given by admissions (If response is "NO", the following Medicare IM given date fields will be blank) Date Medicare IM given:   Date Additional Medicare IM given:    Discharge Disposition:  HOME/SELF CARE  Per UR Regulation:  Reviewed for med. necessity/level of care/duration of stay  If discussed at Long Length of Stay Meetings, dates discussed:    Comments:  02/06/014 Colleen Can BSN RN CCM [phone] Transfer tub bench, RW, and 3n1 delivered to patient's room prior to discharge.

## 2012-07-26 NOTE — Progress Notes (Signed)
Occupational Therapy Treatment Patient Details Name: Cindy Baker MRN: [id number] DOB: 13-Aug-1965 Today's Date: 07/26/2012 Time: 1130-1140 OT Time Calculation (min): 10 min  OT Assessment / Plan / Recommendation Comments on Treatment Session      Follow Up Recommendations  No OT follow up    Barriers to Discharge       Equipment Recommendations  Tub/shower bench;3 in 1 bedside comode    Recommendations for Other Services    Frequency     Plan      Precautions / Restrictions Precautions Precautions: Back Restrictions Weight Bearing Restrictions: No   Pertinent Vitals/Pain Back sore--repostioned    ADL  Lower Body Bathing: Simulated;Supervision/safety (with long sponge) Where Assessed - Lower Body Bathing: Supported sit to stand Lower Body Dressing: Performed;Supervision/safety (pants/socks (no shoes) with AE) Where Assessed - Lower Body Dressing: Supported sit to stand ADL Comments: practiced with AE:  issued kit and toilet aid.  Did not use toilet aid nor long shoeshorn but demonstrated use.  Pt verbalizes understanding    OT Diagnosis:    OT Problem List:   OT Treatment Interventions:     OT Goals Acute Rehab OT Goals OT Goal Formulation: With patient Time For Goal Achievement: 07/26/12 Potential to Achieve Goals: Good Miscellaneous OT Goals Miscellaneous OT Goal #1: Pt will be independent with AE for LB ADLs and toileting OT Goal: Miscellaneous Goal #1 - Progress: Met  Visit Information  Last OT Received On: 07/26/12 Assistance Needed: +1    Subjective Data      Prior Functioning       Cognition  Cognition Overall Cognitive Status: Appears within functional limits for tasks assessed/performed Arousal/Alertness: Awake/alert Orientation Level: Appears intact for tasks assessed Behavior During Session: [facility] for tasks performed    Mobility  Bed Mobility Rolling Right: 5: Supervision Sit to Sidelying Right: 5: Supervision Transfers Sit to Stand:  5: Supervision    Exercises      Balance     End of Session OT - End of Session Activity Tolerance: Patient tolerated treatment well Patient left: in bed;with call bell/phone within reach;with family/visitor present  GO Functional Assessment Tool Used: clinical observation/judgment Functional Limitation: Self care Self Care Current Status (W0981): At least 60 percent but less than 80 percent impaired, limited or restricted Self Care Goal Status (X9147): At least 1 percent but less than 20 percent impaired, limited or restricted   Cindy Baker 07/26/2012, 1:13 PM Marica Otter, OTR/L [phone] 07/26/2012

## 2012-07-26 NOTE — Progress Notes (Signed)
Physical Therapy Treatment Patient Details Name: Cindy Baker MRN: [id number] DOB: 29-Feb-1966 Today's Date: 07/26/2012 Time: 0835-0900 PT Time Calculation (min): 25 min  PT Assessment / Plan / Recommendation Comments on Treatment Session  Pt progressing well with mobility. Reviewed back precautions. Stair training completed. Pt ambulated 400' with RW. RW recommended for home.    Follow Up Recommendations  No PT follow up     Does the patient have the potential to tolerate intense rehabilitation     Barriers to Discharge        Equipment Recommendations  Rolling walker with 5" wheels    Recommendations for Other Services OT consult  Frequency Min 6X/week   Plan Discharge plan remains appropriate;Discharge plan needs to be updated    Precautions / Restrictions Precautions Precautions: Back Precaution Booklet Issued: Yes (comment) Precaution Comments: reviewed precautions   Pertinent Vitals/Pain *5/10 back pain Premedicated prior to PT**    Mobility  Bed Mobility Bed Mobility: Not assessed Transfers Transfers: Sit to Stand;Stand to Sit Sit to Stand: 5: Supervision;From chair/3-in-1;With armrests;With upper extremity assist Stand to Sit: 5: Supervision;To chair/3-in-1;With armrests;With upper extremity assist Details for Transfer Assistance: cues for back precautions and hand placement Ambulation/Gait Ambulation/Gait Assistance: 6: Modified independent (Device/Increase time) Ambulation Distance (Feet): 400 Feet Assistive device: Rolling walker Gait Pattern: Within Functional Limits General Gait Details: no LOB Stairs: Yes Stairs Assistance: 4: Min assist Stairs Assistance Details (indicate cue type and reason): VCs sequencing, min A for RW management Stair Management Technique: No rails Number of Stairs: 2     Exercises     PT Diagnosis:    PT Problem List:   PT Treatment Interventions:     PT Goals Acute Rehab PT Goals PT Goal Formulation: With  patient Time For Goal Achievement: 07/27/12 Potential to Achieve Goals: Good Pt will Roll Supine to Right Side: with supervision Pt will go Supine/Side to Sit: with supervision Pt will go Sit to Supine/Side: with supervision;with min assist Pt will go Sit to Stand: with supervision PT Goal: Sit to Stand - Progress: Met Pt will go Stand to Sit: with supervision PT Goal: Stand to Sit - Progress: Met Pt will Ambulate: 51 - 150 feet;with supervision PT Goal: Ambulate - Progress: Met Pt will Go Up / Down Stairs: 1-2 stairs;with min assist PT Goal: Up/Down Stairs - Progress: Met  Visit Information  Last PT Received On: 07/26/12 Assistance Needed: +1    Subjective Data  Subjective: I wasn't able to walk like this before surgery. Patient Stated Goal: go to craft shows   Cognition  Cognition Overall Cognitive Status: Appears within functional limits for tasks assessed/performed Arousal/Alertness: Awake/alert Orientation Level: Appears intact for tasks assessed Behavior During Session: [facility] for tasks performed    Balance     End of Session PT - End of Session Activity Tolerance: Patient tolerated treatment well Patient left: in chair;with call bell/phone within reach;with family/visitor present Nurse Communication: Mobility status   GP Functional Assessment Tool Used: clinical judegement Functional Limitation: Mobility: Walking and moving around Mobility: Walking and Moving Around Current Status (Y8657): At least 1 percent but less than 20 percent impaired, limited or restricted Mobility: Walking and Moving Around Goal Status [phone]): At least 1 percent but less than 20 percent impaired, limited or restricted Mobility: Walking and Moving Around Discharge Status [phone]): At least 1 percent but less than 20 percent impaired, limited or restricted   Tamala Ser 07/26/2012, 9:07 AM [phone]

## 2012-07-26 NOTE — Evaluation (Signed)
Occupational Therapy Evaluation Patient Details Name: Cindy Baker MRN: [id number] DOB: 1966-02-28 Today's Date: 07/26/2012 Time: 1478-2956 OT Time Calculation (min): 22 min  OT Assessment / Plan / Recommendation Clinical Impression  This 47 year old female was admitted for L5-S1 decompression.  Most of education was completed:  will try to return later to issue AE.  Pt has been having help from significant other but he also has joint problems.      OT Assessment  Patient does not need any further OT services    Follow Up Recommendations  No OT follow up    Barriers to Discharge      Equipment Recommendations  3 in 1 bedside comode;Tub/shower bench (if insurance will cover tub bench:  pt cannot afford)    Recommendations for Other Services    Frequency       Precautions / Restrictions Precautions Precautions: Back Precaution Booklet Issued: Yes (comment) Precaution Comments: reviewed precautions Restrictions Weight Bearing Restrictions: No   Pertinent Vitals/Pain 3/10 back    ADL  Grooming: Performed;Set up (spit into cup without cues) Where Assessed - Grooming: Supported standing Lower Body Bathing: Simulated;Moderate assistance Where Assessed - Lower Body Bathing: Supported sit to stand Lower Body Dressing: Simulated;Maximal assistance Where Assessed - Lower Body Dressing: Supported sit to stand Toilet Transfer: Performed;Min Pension scheme manager Method: Sit to Barista: Raised toilet seat with arms (or 3-in-1 over toilet) Toileting - Clothing Manipulation and Hygiene: Simulated;Minimal assistance Where Assessed - Engineer, mining and Hygiene: Sit to stand from 3-in-1 or toilet Equipment Used: Rolling walker Transfers/Ambulation Related to ADLs: min guard:  pt tends to recruit traps to guard when walking.  Able to relax with cues  Pt is not ready to step into tub yet.  She would benefit from a tub transfer bench, if insurance  will cover this. ADL Comments: Pt has M'care/caid.  Will try to issue AE later today    OT Diagnosis:    OT Problem List:   OT Treatment Interventions:     OT Goals Acute Rehab OT Goals OT Goal Formulation: With patient Time For Goal Achievement: 07/26/12 Potential to Achieve Goals: Good Miscellaneous OT Goals Miscellaneous OT Goal #1: Pt will be independent with AE for LB ADLs and toileting OT Goal: Miscellaneous Goal #1 - Progress: Goal set today  Visit Information  Last OT Received On: 07/26/12 Assistance Needed: +1    Subjective Data  Subjective: I'm familiar with all of that (DME); I helped take care of my dad Patient Stated Goal: pain is gone--continue to get better   Prior Functioning     Home Living Lives With: Significant other Available Help at Discharge: Family Type of Home: House Home Access: Stairs to enter Secretary/administrator of Steps: 2 Home Layout: One level Bathroom Shower/Tub: Engineer, manufacturing systems: Standard Home Adaptive Equipment: None Additional Comments: no sink within reach of toilet Prior Function Level of Independence: Independent Able to Take Stairs?: Yes Communication Communication: No difficulties         Vision/Perception     Cognition  Cognition Overall Cognitive Status: Appears within functional limits for tasks assessed/performed Arousal/Alertness: Awake/alert Orientation Level: Appears intact for tasks assessed Behavior During Session: [facility] for tasks performed    Extremity/Trunk Assessment Right Upper Extremity Assessment RUE ROM/Strength/Tone: [facility] for tasks assessed RUE Coordination:  (has intention tremor) Left Upper Extremity Assessment LUE ROM/Strength/Tone: [facility] for tasks assessed     Mobility Bed Mobility Bed Mobility: Not assessed Transfers Sit  to Stand: 5: Supervision;From chair/3-in-1;With armrests;With upper extremity assist Stand to Sit: 5: Supervision;To chair/3-in-1;With armrests;With upper  extremity assist Details for Transfer Assistance: cues for back precautions and hand placement     Exercise     Balance     End of Session OT - End of Session Activity Tolerance: Patient tolerated treatment well Patient left: in chair;with call bell/phone within reach;with family/visitor present  GO Functional Assessment Tool Used: clinical observation/judgment Functional Limitation: Self care Self Care Current Status (W0981): At least 60 percent but less than 80 percent impaired, limited or restricted Self Care Goal Status (X9147): At least 1 percent but less than 20 percent impaired, limited or restricted   [facility] 07/26/2012, 9:36 AM Marica Otter, OTR/L [phone] 07/26/2012

## 2012-07-26 NOTE — Progress Notes (Addendum)
Subjective: 1 Day Post-Op Procedure(s) (LRB): LUMBAR LAMINECTOMY/DECOMPRESSION MICRODISCECTOMY 1 LEVEL (N/A) Patient reports pain as mild.  Notes mild incisional lower back pain. Leg pain resolved. Notes some improvement of the numbness she had pre-op in her right foot.  Objective: Vital signs in last 24 hours: Temp:  [97.6 F (36.4 C)-98.5 F (36.9 C)] 98 F (36.7 C) (02/06 0517) Pulse Rate:  [65-101] 82  (02/06 0517) Resp:  [11-21] 16  (02/06 0517) BP: (105-147)/(49-84) 115/76 mmHg (02/06 0517) SpO2:  [94 %-100 %] 98 % (02/06 0517) Weight:  [87.091 kg (192 lb)] 87.091 kg (192 lb) (02/05 1157)  Intake/Output from previous day: 02/05 0701 - 02/06 0700 In: 4514.2 [P.O.:1440; I.V.:3019.2; IV Piggyback:55] Out: 2050 [Urine:2000; Blood:50] Intake/Output this shift:    No results found for this basename: HGB:5 in the last 72 hours No results found for this basename: WBC:2,RBC:2,HCT:2,PLT:2 in the last 72 hours No results found for this basename: NA:2,K:2,CL:2,CO2:2,BUN:2,CREATININE:2,GLUCOSE:2,CALCIUM:2 in the last 72 hours No results found for this basename: LABPT:2,INR:2 in the last 72 hours  Neurologically intact Neurovascular intact Sensation intact distally Intact pulses distally Dorsiflexion/Plantar flexion intact Incision: dressing C/D/I and no drainage No cellulitis present Compartment soft No calf pain, negative Homan's, no sign of DVT  Assessment/Plan: 1 Day Post-Op Procedure(s) (LRB): LUMBAR LAMINECTOMY/DECOMPRESSION MICRODISCECTOMY 1 LEVEL (N/A) Advance diet Up with therapy D/C IV fluids D/C home today after AM PT Nicotine patch Rx by pt request, smoking cessation  Reviewed D/C instructions Discussed with Dr. Elissa Lovett, Dayna Barker. 07/26/2012, 8:38 AM

## 2012-07-26 NOTE — Discharge Summary (Signed)
Physician Discharge Summary   Patient ID: Cindy Baker MRN: [id number] DOB/AGE: [birth year] 47 y.o.  Admit date: 07/25/2012 Discharge date: 07/26/2012  Primary Diagnosis:   HNP L5-S1  Admission Diagnoses:  Past Medical History  Diagnosis Date  . Hyperlipidemia   . Fibromyalgia   . Arthritis     osteoarthritis  . Abnormal Pap smear of cervix   . Diabetes mellitus without complication   . Adrenal nodule   . Hypertension     on no meds   . Anxiety   . Depression   . Shortness of breath     related to fibromyalgia  . UTI (lower urinary tract infection)   . Anemia   . Chronic gum disease    Discharge Diagnoses:   Principal Problem:  *HNP (herniated nucleus pulposus), lumbar  Procedure:  Procedure(s) (LRB): LUMBAR LAMINECTOMY/DECOMPRESSION MICRODISCECTOMY 1 LEVEL (N/A)   Consults: None  HPI:  See H&P. Back and right leg pain, weakness, numbness refractory to conservative tx.    Laboratory Data: Hospital Outpatient Visit on 07/19/2012  Component Date Value Range Status  . MRSA, PCR 07/19/2012 NEGATIVE  NEGATIVE Final  . Staphylococcus aureus 07/19/2012 POSITIVE* NEGATIVE Final   Comment:                                 The Xpert SA Assay (FDA                          approved for NASAL specimens                          in patients over 59 years of age),                          is one component of                          a comprehensive surveillance                          program.  Test performance has                          been validated by [employer] for patients greater                          than or equal to 18 year old.                          It is not intended                          to diagnose infection nor to                          guide or monitor treatment.  . Preg, Serum 07/19/2012 NEGATIVE  NEGATIVE Final  . WBC 07/19/2012 8.6  4.0 - 10.5 K/uL Final  . RBC 07/19/2012 4.65  3.87 - 5.11 MIL/uL Final  .  Hemoglobin 07/19/2012 12.3  12.0 - 15.0 g/dL Final  . HCT 16/03/9603 38.8  36.0 - 46.0 % Final  . MCV 07/19/2012 83.4  78.0 - 100.0 fL Final  . MCH 07/19/2012 26.5  26.0 - 34.0 pg Final  . MCHC 07/19/2012 31.7  30.0 - 36.0 g/dL Final  . RDW 54/02/8118 15.0  11.5 - 15.5 % Final  . Platelets 07/19/2012 600* 150 - 400 K/uL Final  . Sodium 07/19/2012 135  135 - 145 mEq/L Final  . Potassium 07/19/2012 4.1  3.5 - 5.1 mEq/L Final  . Chloride 07/19/2012 100  96 - 112 mEq/L Final  . CO2 07/19/2012 24  19 - 32 mEq/L Final  . Glucose, Bld 07/19/2012 141* 70 - 99 mg/dL Final  . BUN 14/78/2956 15  6 - 23 mg/dL Final  . Creatinine, Ser 07/19/2012 0.83  0.50 - 1.10 mg/dL Final  . Calcium 21/30/8657 9.3  8.4 - 10.5 mg/dL Final  . GFR calc non Af Amer 07/19/2012 85* >90 mL/min Final  . GFR calc Af Amer 07/19/2012 >90  >90 mL/min Final   Comment:                                 The eGFR has been calculated                          using the CKD EPI equation.                          This calculation has not been                          validated in all clinical                          situations.                          eGFR's persistently                          <90 mL/min signify                          possible Chronic Kidney Disease.   No results found for this basename: HGB:5 in the last 72 hours No results found for this basename: WBC:2,RBC:2,HCT:2,PLT:2 in the last 72 hours No results found for this basename: NA:2,K:2,CL:2,CO2:2,BUN:2,CREATININE:2,GLUCOSE:2,CALCIUM:2 in the last 72 hours No results found for this basename: LABPT:2,INR:2 in the last 72 hours  X-Rays:Dg Chest 2 View  07/25/2012  *RADIOLOGY REPORT*  Clinical Data: Preop lumbar laminectomy  CHEST - 2 VIEW  Comparison: None  Findings: Heart size is normal.  Pulmonary vascularity is normal. Lungs are clear without infiltrate or effusion.  No mass lesion. No focal bony abnormality.  IMPRESSION: Normal chest x-ray.   Original  Report Authenticated By: Janeece Riggers, M.D.    X-ray Lumbar Spine Ap And Lateral  07/25/2012  *RADIOLOGY REPORT*  Clinical Data: Preop lumbar laminectomy  LUMBAR SPINE - 2-3 VIEW  Comparison: Lumbar MRI 01/27/2012  Findings: Five  lumbar segments are present.  2 mm anterior slip L4-5.  Disc spaces are maintained.  No fracture or mass is identified.  SI  joints are normal.  Lumbar vertebra were labeled for operative localization. Mild Schmorl's node at L1-2.  IMPRESSION: Five lumbar segments.  Mild anterior slip L4-5.  No acute bony abnormality.   Original Report Authenticated By: Janeece Riggers, M.D.    Dg Spine Portable 1 View  07/25/2012  *RADIOLOGY REPORT*  Clinical Data: Surgical level L5-S1  PORTABLE SPINE - 1 VIEW  Comparison: Radiographs from earlier today.  Findings: This exam is labeled #3  Surgical instrument has been advanced to the L5 S1 disc space. Mild anterior slip L4-5.  IMPRESSION: Surgical instrument overlies the L5-S1 disc space.   Original Report Authenticated By: Janeece Riggers, M.D.    Dg Spine Portable 1 View  07/25/2012  *RADIOLOGY REPORT*  Clinical Data: Surgical level L5-S1.  PORTABLE SPINE - 1 VIEW  Comparison: Earlier same day.  Findings: Cross-table lateral view labeled #2 at 0916 hours. Skin spreaders are present posterior to the upper sacrum.  There is a blunt surgical instrument directed towards the inferior aspect of L5-S1 disc space level.  IMPRESSION: Intraoperative localization as described.   Original Report Authenticated By: Carey Bullocks, M.D.    Dg Spine Portable 1 View  07/25/2012  *RADIOLOGY REPORT*  Clinical Data: Surgical level L5-S1.  PORTABLE SPINE - 1 VIEW  Comparison: Lumbar spine radiographs earlier same date.  Findings: Cross-table lateral view of the lumbar spine demonstrates a posterior localizing needle posterior to the upper sacrum.  The lumbar alignment appears unchanged.  IMPRESSION: Intraoperative localization as described.   Original Report Authenticated By:  Carey Bullocks, M.D.     EKG: Orders placed during the hospital encounter of 07/19/12  . EKG 12-LEAD  . EKG 12-LEAD     Hospital Course: Patient was admitted to [facility] and taken to the OR and underwent the above state procedure without complications.  Patient tolerated the procedure well and was later transferred to the recovery room and then to the orthopaedic floor for postoperative care.  They were given PO and IV analgesics for pain control following their surgery.  They were given 24 hours of postoperative antibiotics.   PT was consulted postop to assist with mobility and transfers.  The patient was allowed to be WBAT with therapy and was taught back precautions. Discharge planning was consulted to help with postop disposition and equipment needs.  Patient had a comfortable night on the evening of surgery and started to get up OOB with therapy on day one. Patient was seen in rounds and was ready to go home on day one.  They were given discharge instructions and dressing directions.  They were instructed on when to follow up in the office with Dr. Shelle Iron.  Discharge Medications: Prior to Admission medications   Medication Sig Start Date End Date Taking? Authorizing Provider  amitriptyline (ELAVIL) 10 MG tablet Take 10 mg by mouth at bedtime.    Yes Historical Provider, MD  Cetylpyridinium Chloride (ANTISEPTIC ORAL RINSE) 0.05 % LIQD Use as directed in the mouth or throat 2 (two) times daily.   Yes Historical Provider, MD  cholecalciferol (VITAMIN D) 400 UNITS TABS Take 400 Units by mouth daily.   Yes Historical Provider, MD  Cranberry (SM CRANBERRY) 300 MG tablet Take 300 mg by mouth daily.   Yes Historical Provider, MD  diclofenac (VOLTAREN) 75 MG EC tablet Take 75 mg by mouth 2 (two) times daily.   Yes Historical Provider, MD  DULoxetine (CYMBALTA) 30 MG capsule Take 30 mg by mouth daily before breakfast.  Yes Historical Provider, MD  escitalopram (LEXAPRO) 5 MG tablet Take 5  mg by mouth daily before breakfast.    Yes Historical Provider, MD  Fenofibrate 150 MG CAPS Take 1 capsule by mouth daily before breakfast. Patient takes with largest meal   Yes Historical Provider, MD  gabapentin (NEURONTIN) 300 MG capsule Take 300 mg by mouth 3 (three) times daily.   Yes Historical Provider, MD  metFORMIN (GLUCOPHAGE) 500 MG tablet Take 500 mg by mouth daily with breakfast.   Yes Historical Provider, MD  Multiple Vitamin (MULTIVITAMIN WITH MINERALS) TABS Take 1 tablet by mouth daily.   Yes Historical Provider, MD  Omega-3 Fatty Acids (FISH OIL) 1200 MG CAPS Take 1 capsule by mouth daily.   Yes Historical Provider, MD  ranitidine (ZANTAC) 150 MG capsule Take 150 mg by mouth at bedtime.    Yes Historical Provider, MD  traMADol (ULTRAM) 50 MG tablet Take 50 mg by mouth every 6 (six) hours as needed. Pain   Yes Historical Provider, MD  vitamin C (ASCORBIC ACID) 500 MG tablet Take 500 mg by mouth daily.   Yes Historical Provider, MD  HYDROcodone-acetaminophen (NORCO) 7.5-325 MG per tablet Take 1-2 tablets by mouth every 4 (four) hours as needed for pain. 07/25/12   Javier Docker, MD  methocarbamol (ROBAXIN) 500 MG tablet Take 1 tablet (500 mg total) by mouth 3 (three) times daily. 07/25/12   Javier Docker, MD  nicotine (NICODERM CQ - DOSED IN MG/24 HR) 7 mg/24hr patch Place 1 patch onto the skin daily. 07/26/12   Dayna Barker. Christene Lye, PA-C    Diet: Diabetic diet Activity:WBAT Follow-up:in 10-14 days Disposition - Home Discharged Condition: good   Discharge Orders    Future Orders Please Complete By Expires   Diet - low sodium heart healthy      Call MD / Call 911      Comments:   If you experience chest pain or shortness of breath, CALL 911 and be transported to the hospital emergency room.  If you develope a fever above 101 F, pus (white drainage) or increased drainage or redness at the wound, or calf pain, call your surgeon's office.   Constipation Prevention      Comments:    Drink plenty of fluids.  Prune juice may be helpful.  You may use a stool softener, such as Colace (over the counter) 100 mg twice a day.  Use MiraLax (over the counter) for constipation as needed.   Increase activity slowly as tolerated          Medication List     As of 07/26/2012  8:06 PM    TAKE these medications         amitriptyline 10 MG tablet   Commonly known as: ELAVIL   Take 10 mg by mouth at bedtime.      ANTISEPTIC ORAL RINSE 0.05 % Liqd   Generic drug: Cetylpyridinium Chloride   Use as directed in the mouth or throat 2 (two) times daily.      cholecalciferol 400 UNITS Tabs   Commonly known as: VITAMIN D   Take 400 Units by mouth daily.      diclofenac 75 MG EC tablet   Commonly known as: VOLTAREN   Take 75 mg by mouth 2 (two) times daily.      DULoxetine 30 MG capsule   Commonly known as: CYMBALTA   Take 30 mg by mouth daily before breakfast.      escitalopram 5  MG tablet   Commonly known as: LEXAPRO   Take 5 mg by mouth daily before breakfast.      Fenofibrate 150 MG Caps   Take 1 capsule by mouth daily before breakfast. Patient takes with largest meal      Fish Oil 1200 MG Caps   Take 1 capsule by mouth daily.      gabapentin 300 MG capsule   Commonly known as: NEURONTIN   Take 300 mg by mouth 3 (three) times daily.      HYDROcodone-acetaminophen 7.5-325 MG per tablet   Commonly known as: NORCO   Take 1-2 tablets by mouth every 4 (four) hours as needed for pain.      metFORMIN 500 MG tablet   Commonly known as: GLUCOPHAGE   Take 500 mg by mouth daily with breakfast.      methocarbamol 500 MG tablet   Commonly known as: ROBAXIN   Take 1 tablet (500 mg total) by mouth 3 (three) times daily.      multivitamin with minerals Tabs   Take 1 tablet by mouth daily.      nicotine 7 mg/24hr patch   Commonly known as: NICODERM CQ - dosed in mg/24 hr   Place 1 patch onto the skin daily.      ranitidine 150 MG capsule   Commonly known as: ZANTAC    Take 150 mg by mouth at bedtime.      SM CRANBERRY 300 MG tablet   Generic drug: Cranberry   Take 300 mg by mouth daily.      traMADol 50 MG tablet   Commonly known as: ULTRAM   Take 50 mg by mouth every 6 (six) hours as needed. Pain      vitamin C 500 MG tablet   Commonly known as: ASCORBIC ACID   Take 500 mg by mouth daily.           Follow-up Information    Follow up with BEANE,JEFFREY C, MD. In 10 days.   Contact information:   [address] [place] [address] [phone]          Signed: Dorothy Spark. 07/26/2012, 8:06 PM

## 2012-08-04 ENCOUNTER — Other Ambulatory Visit: Payer: Self-pay

## 2012-08-29 DIAGNOSIS — M5126 Other intervertebral disc displacement, lumbar region: Secondary | ICD-10-CM | POA: Diagnosis not present

## 2012-09-19 DIAGNOSIS — M5126 Other intervertebral disc displacement, lumbar region: Secondary | ICD-10-CM | POA: Diagnosis not present

## 2012-12-25 DIAGNOSIS — E7800 Pure hypercholesterolemia, unspecified: Secondary | ICD-10-CM | POA: Diagnosis not present

## 2012-12-25 DIAGNOSIS — E119 Type 2 diabetes mellitus without complications: Secondary | ICD-10-CM | POA: Diagnosis not present

## 2012-12-25 DIAGNOSIS — K219 Gastro-esophageal reflux disease without esophagitis: Secondary | ICD-10-CM | POA: Diagnosis not present

## 2013-01-14 DIAGNOSIS — E119 Type 2 diabetes mellitus without complications: Secondary | ICD-10-CM | POA: Diagnosis not present

## 2013-04-25 ENCOUNTER — Other Ambulatory Visit: Payer: Self-pay

## 2013-06-22 ENCOUNTER — Encounter (HOSPITAL_COMMUNITY): Payer: Self-pay | Admitting: Emergency Medicine

## 2013-06-22 ENCOUNTER — Emergency Department (INDEPENDENT_AMBULATORY_CARE_PROVIDER_SITE_OTHER)
Admission: EM | Admit: 2013-06-22 | Discharge: 2013-06-22 | Disposition: A | Discharge status: Home / Self Care | Payer: Self-pay | Source: Home / Self Care | Attending: Family Medicine | Admitting: Family Medicine

## 2013-06-22 ENCOUNTER — Emergency Department (INDEPENDENT_AMBULATORY_CARE_PROVIDER_SITE_OTHER): Payer: Medicare Other

## 2013-06-22 DIAGNOSIS — M25539 Pain in unspecified wrist: Secondary | ICD-10-CM

## 2013-06-22 DIAGNOSIS — S59909A Unspecified injury of unspecified elbow, initial encounter: Secondary | ICD-10-CM | POA: Diagnosis not present

## 2013-06-22 DIAGNOSIS — M542 Cervicalgia: Secondary | ICD-10-CM

## 2013-06-22 MED ORDER — DICLOFENAC POTASSIUM 50 MG PO TABS: 50.0000 mg | ORAL_TABLET | Freq: Three times a day (TID) | ORAL | Status: DC
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
## 2013-06-22 NOTE — Discharge Instructions (Signed)
Wear splint for comfort, use medicine as needed and see your doctor if further problems.

## 2013-06-27 ENCOUNTER — Emergency Department (HOSPITAL_COMMUNITY)
Admission: EM | Admit: 2013-06-27 | Discharge: 2013-06-27 | Disposition: A | Discharge status: Home / Self Care | Payer: No Typology Code available for payment source | Attending: Emergency Medicine | Admitting: Emergency Medicine

## 2013-06-27 ENCOUNTER — Encounter (HOSPITAL_COMMUNITY): Payer: Self-pay | Admitting: Emergency Medicine

## 2013-06-27 ENCOUNTER — Emergency Department (HOSPITAL_COMMUNITY): Payer: No Typology Code available for payment source

## 2013-06-27 DIAGNOSIS — E785 Hyperlipidemia, unspecified: Secondary | ICD-10-CM | POA: Insufficient documentation

## 2013-06-27 DIAGNOSIS — S139XXA Sprain of joints and ligaments of unspecified parts of neck, initial encounter: Secondary | ICD-10-CM | POA: Insufficient documentation

## 2013-06-27 DIAGNOSIS — M129 Arthropathy, unspecified: Secondary | ICD-10-CM | POA: Insufficient documentation

## 2013-06-27 DIAGNOSIS — E119 Type 2 diabetes mellitus without complications: Secondary | ICD-10-CM | POA: Diagnosis not present

## 2013-06-27 DIAGNOSIS — S199XXA Unspecified injury of neck, initial encounter: Secondary | ICD-10-CM | POA: Diagnosis not present

## 2013-06-27 DIAGNOSIS — F411 Generalized anxiety disorder: Secondary | ICD-10-CM | POA: Insufficient documentation

## 2013-06-27 DIAGNOSIS — Z79899 Other long term (current) drug therapy: Secondary | ICD-10-CM | POA: Diagnosis not present

## 2013-06-27 DIAGNOSIS — F3289 Other specified depressive episodes: Secondary | ICD-10-CM | POA: Insufficient documentation

## 2013-06-27 DIAGNOSIS — S161XXD Strain of muscle, fascia and tendon at neck level, subsequent encounter: Secondary | ICD-10-CM

## 2013-06-27 DIAGNOSIS — Z791 Long term (current) use of non-steroidal anti-inflammatories (NSAID): Secondary | ICD-10-CM | POA: Insufficient documentation

## 2013-06-27 DIAGNOSIS — F329 Major depressive disorder, single episode, unspecified: Secondary | ICD-10-CM | POA: Insufficient documentation

## 2013-06-27 DIAGNOSIS — I10 Essential (primary) hypertension: Secondary | ICD-10-CM | POA: Diagnosis not present

## 2013-06-27 DIAGNOSIS — S0993XA Unspecified injury of face, initial encounter: Secondary | ICD-10-CM | POA: Diagnosis not present

## 2013-06-27 DIAGNOSIS — M25549 Pain in joints of unspecified hand: Secondary | ICD-10-CM | POA: Insufficient documentation

## 2013-06-27 DIAGNOSIS — M542 Cervicalgia: Secondary | ICD-10-CM | POA: Diagnosis present

## 2013-06-27 DIAGNOSIS — F17200 Nicotine dependence, unspecified, uncomplicated: Secondary | ICD-10-CM | POA: Insufficient documentation

## 2013-06-27 DIAGNOSIS — R51 Headache: Secondary | ICD-10-CM | POA: Insufficient documentation

## 2013-06-27 MED ORDER — CYCLOBENZAPRINE HCL 10 MG PO TABS
10.0000 mg | ORAL_TABLET | Freq: Once | ORAL | Status: DC
  Filled 2013-06-27: qty 1

## 2013-06-27 MED ORDER — CYCLOBENZAPRINE HCL 10 MG PO TABS: 10.0000 mg | ORAL_TABLET | Freq: Three times a day (TID) | ORAL | Status: DC | PRN

## 2013-06-27 MED ORDER — HYDROCODONE-ACETAMINOPHEN 5-325 MG PO TABS
1.0000 | ORAL_TABLET | Freq: Once | ORAL | Status: DC
  Filled 2013-06-27: qty 1

## 2013-06-27 MED ORDER — HYDROCODONE-ACETAMINOPHEN 5-325 MG PO TABS: 1.0000 | ORAL_TABLET | ORAL | Status: DC | PRN

## 2013-06-27 NOTE — ED Notes (Signed)
1345 Radiology notified that xray has not resulted yet. PA advised
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
## 2013-06-28 NOTE — ED Provider Notes (Signed)
Medical screening examination/treatment/procedure(s) were performed by non-physician practitioner and as supervising physician I was immediately available for consultation/collaboration.  EKG Interpretation   None        Virgel Manifold, MD 06/28/13 1544

## 2013-07-02 DIAGNOSIS — M542 Cervicalgia: Secondary | ICD-10-CM | POA: Diagnosis not present

## 2013-07-02 DIAGNOSIS — M545 Low back pain, unspecified: Secondary | ICD-10-CM | POA: Diagnosis not present

## 2013-07-02 DIAGNOSIS — M25539 Pain in unspecified wrist: Secondary | ICD-10-CM | POA: Diagnosis not present

## 2013-07-02 DIAGNOSIS — E119 Type 2 diabetes mellitus without complications: Secondary | ICD-10-CM | POA: Diagnosis not present

## 2013-07-18 DIAGNOSIS — M542 Cervicalgia: Secondary | ICD-10-CM | POA: Diagnosis not present

## 2013-07-18 DIAGNOSIS — Z23 Encounter for immunization: Secondary | ICD-10-CM | POA: Diagnosis not present

## 2013-07-18 DIAGNOSIS — M5126 Other intervertebral disc displacement, lumbar region: Secondary | ICD-10-CM | POA: Diagnosis not present

## 2013-07-18 DIAGNOSIS — M549 Dorsalgia, unspecified: Secondary | ICD-10-CM | POA: Diagnosis not present

## 2013-07-18 DIAGNOSIS — S139XXA Sprain of joints and ligaments of unspecified parts of neck, initial encounter: Secondary | ICD-10-CM | POA: Diagnosis not present

## 2013-08-29 DIAGNOSIS — M25539 Pain in unspecified wrist: Secondary | ICD-10-CM | POA: Diagnosis not present

## 2013-08-29 DIAGNOSIS — E119 Type 2 diabetes mellitus without complications: Secondary | ICD-10-CM | POA: Diagnosis not present

## 2013-08-29 DIAGNOSIS — M542 Cervicalgia: Secondary | ICD-10-CM | POA: Diagnosis not present

## 2013-10-03 DIAGNOSIS — M542 Cervicalgia: Secondary | ICD-10-CM | POA: Diagnosis not present

## 2013-10-03 DIAGNOSIS — E1110 Type 2 diabetes mellitus with ketoacidosis without coma: Secondary | ICD-10-CM | POA: Diagnosis not present
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
## 2014-04-21 ENCOUNTER — Encounter (HOSPITAL_COMMUNITY): Payer: Self-pay | Admitting: Emergency Medicine

## 2014-10-26 ENCOUNTER — Emergency Department (HOSPITAL_COMMUNITY)
Admission: EM | Admit: 2014-10-26 | Discharge: 2014-10-26 | Disposition: A | Discharge status: Home / Self Care | Payer: Medicare Other | Attending: Emergency Medicine | Admitting: Emergency Medicine

## 2014-10-26 ENCOUNTER — Encounter (HOSPITAL_COMMUNITY): Payer: Self-pay | Admitting: Emergency Medicine

## 2014-10-26 DIAGNOSIS — M1990 Unspecified osteoarthritis, unspecified site: Secondary | ICD-10-CM | POA: Diagnosis not present

## 2014-10-26 DIAGNOSIS — F329 Major depressive disorder, single episode, unspecified: Secondary | ICD-10-CM | POA: Diagnosis not present

## 2014-10-26 DIAGNOSIS — Y9289 Other specified places as the place of occurrence of the external cause: Secondary | ICD-10-CM | POA: Insufficient documentation

## 2014-10-26 DIAGNOSIS — Y9389 Activity, other specified: Secondary | ICD-10-CM | POA: Insufficient documentation

## 2014-10-26 DIAGNOSIS — Z87440 Personal history of urinary (tract) infections: Secondary | ICD-10-CM | POA: Diagnosis not present

## 2014-10-26 DIAGNOSIS — Y998 Other external cause status: Secondary | ICD-10-CM | POA: Insufficient documentation

## 2014-10-26 DIAGNOSIS — F419 Anxiety disorder, unspecified: Secondary | ICD-10-CM | POA: Diagnosis not present

## 2014-10-26 DIAGNOSIS — T24211A Burn of second degree of right thigh, initial encounter: Secondary | ICD-10-CM | POA: Diagnosis not present

## 2014-10-26 DIAGNOSIS — Z862 Personal history of diseases of the blood and blood-forming organs and certain disorders involving the immune mechanism: Secondary | ICD-10-CM | POA: Insufficient documentation

## 2014-10-26 DIAGNOSIS — L089 Local infection of the skin and subcutaneous tissue, unspecified: Secondary | ICD-10-CM | POA: Diagnosis not present

## 2014-10-26 DIAGNOSIS — T798XXA Other early complications of trauma, initial encounter: Secondary | ICD-10-CM

## 2014-10-26 DIAGNOSIS — I10 Essential (primary) hypertension: Secondary | ICD-10-CM | POA: Diagnosis not present

## 2014-10-26 DIAGNOSIS — E785 Hyperlipidemia, unspecified: Secondary | ICD-10-CM | POA: Insufficient documentation

## 2014-10-26 DIAGNOSIS — E119 Type 2 diabetes mellitus without complications: Secondary | ICD-10-CM | POA: Diagnosis not present

## 2014-10-26 DIAGNOSIS — Z720 Tobacco use: Secondary | ICD-10-CM | POA: Insufficient documentation

## 2014-10-26 DIAGNOSIS — M797 Fibromyalgia: Secondary | ICD-10-CM | POA: Insufficient documentation

## 2014-10-26 DIAGNOSIS — Z791 Long term (current) use of non-steroidal anti-inflammatories (NSAID): Secondary | ICD-10-CM | POA: Diagnosis not present

## 2014-10-26 DIAGNOSIS — Z79899 Other long term (current) drug therapy: Secondary | ICD-10-CM | POA: Insufficient documentation

## 2014-10-26 DIAGNOSIS — X19XXXA Contact with other heat and hot substances, initial encounter: Secondary | ICD-10-CM | POA: Insufficient documentation

## 2014-10-26 MED ORDER — SILVER SULFADIAZINE 1 % EX CREA
TOPICAL_CREAM | Freq: Once | CUTANEOUS | Status: AC
  Administered 2014-10-26: 1 via TOPICAL
  Filled 2014-10-26: qty 50

## 2014-10-26 MED ORDER — CEPHALEXIN 500 MG PO CAPS: 500.0000 mg | ORAL_CAPSULE | Freq: Four times a day (QID) | ORAL | Status: DC

## 2014-10-26 MED ORDER — SULFAMETHOXAZOLE-TRIMETHOPRIM 800-160 MG PO TABS: 1.0000 | ORAL_TABLET | Freq: Two times a day (BID) | ORAL | Status: AC
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
## 2014-10-26 NOTE — Discharge Instructions (Signed)
Please follow the directions provided. Be sure to follow-up with your primary care doctor in a few days just to make sure you're getting better. Please clean your wound daily with warm soapy water. Replace your dressing every day using Silvadene cream. Start taking the antibiotics and take both as directed until they're all gone. Don't hesitate to return for any new, worsening, or concerning symptoms.   SEEK IMMEDIATE MEDICAL CARE IF:  You develop excessive pain.  You develop redness, tenderness, swelling, or red streaks near the burn.  The burned area develops yellowish-white fluid (pus) or a bad smell.  You have a fever.

## 2014-11-10 DIAGNOSIS — E1165 Type 2 diabetes mellitus with hyperglycemia: Secondary | ICD-10-CM | POA: Diagnosis not present

## 2014-11-10 DIAGNOSIS — I10 Essential (primary) hypertension: Secondary | ICD-10-CM | POA: Diagnosis not present

## 2014-11-10 DIAGNOSIS — T24211A Burn of second degree of right thigh, initial encounter: Secondary | ICD-10-CM | POA: Diagnosis not present

## 2014-11-11 DIAGNOSIS — E785 Hyperlipidemia, unspecified: Secondary | ICD-10-CM | POA: Diagnosis not present

## 2014-11-11 DIAGNOSIS — R7309 Other abnormal glucose: Secondary | ICD-10-CM | POA: Diagnosis not present

## 2014-12-10 DIAGNOSIS — Z0000 Encounter for general adult medical examination without abnormal findings: Secondary | ICD-10-CM | POA: Diagnosis not present

## 2015-02-02 DIAGNOSIS — E118 Type 2 diabetes mellitus with unspecified complications: Secondary | ICD-10-CM | POA: Diagnosis not present

## 2015-02-02 DIAGNOSIS — M79672 Pain in left foot: Secondary | ICD-10-CM | POA: Diagnosis not present

## 2015-03-16 DIAGNOSIS — E1165 Type 2 diabetes mellitus with hyperglycemia: Secondary | ICD-10-CM | POA: Diagnosis not present

## 2015-03-16 DIAGNOSIS — M545 Low back pain: Secondary | ICD-10-CM | POA: Diagnosis not present

## 2015-03-16 DIAGNOSIS — M1990 Unspecified osteoarthritis, unspecified site: Secondary | ICD-10-CM | POA: Diagnosis not present

## 2015-06-30 DIAGNOSIS — M545 Low back pain: Secondary | ICD-10-CM | POA: Diagnosis not present

## 2015-12-13 ENCOUNTER — Ambulatory Visit (HOSPITAL_COMMUNITY): Payer: Medicare Other

## 2015-12-13 ENCOUNTER — Encounter (HOSPITAL_COMMUNITY): Payer: Self-pay | Admitting: Emergency Medicine

## 2015-12-13 ENCOUNTER — Ambulatory Visit (HOSPITAL_COMMUNITY)
Admission: EM | Admit: 2015-12-13 | Discharge: 2015-12-13 | Disposition: A | Discharge status: Home / Self Care | Payer: Medicare Other | Attending: Emergency Medicine | Admitting: Emergency Medicine

## 2015-12-13 DIAGNOSIS — M797 Fibromyalgia: Secondary | ICD-10-CM | POA: Diagnosis not present

## 2015-12-13 DIAGNOSIS — Z808 Family history of malignant neoplasm of other organs or systems: Secondary | ICD-10-CM | POA: Diagnosis not present

## 2015-12-13 DIAGNOSIS — Z7984 Long term (current) use of oral hypoglycemic drugs: Secondary | ICD-10-CM | POA: Diagnosis not present

## 2015-12-13 DIAGNOSIS — Z79899 Other long term (current) drug therapy: Secondary | ICD-10-CM | POA: Insufficient documentation

## 2015-12-13 DIAGNOSIS — Z9851 Tubal ligation status: Secondary | ICD-10-CM | POA: Insufficient documentation

## 2015-12-13 DIAGNOSIS — E785 Hyperlipidemia, unspecified: Secondary | ICD-10-CM | POA: Diagnosis not present

## 2015-12-13 DIAGNOSIS — S8391XA Sprain of unspecified site of right knee, initial encounter: Secondary | ICD-10-CM | POA: Insufficient documentation

## 2015-12-13 DIAGNOSIS — M1990 Unspecified osteoarthritis, unspecified site: Secondary | ICD-10-CM | POA: Insufficient documentation

## 2015-12-13 DIAGNOSIS — M25561 Pain in right knee: Secondary | ICD-10-CM | POA: Diagnosis present

## 2015-12-13 DIAGNOSIS — Z9889 Other specified postprocedural states: Secondary | ICD-10-CM | POA: Insufficient documentation

## 2015-12-13 DIAGNOSIS — F17210 Nicotine dependence, cigarettes, uncomplicated: Secondary | ICD-10-CM | POA: Diagnosis not present

## 2015-12-13 DIAGNOSIS — W010XXA Fall on same level from slipping, tripping and stumbling without subsequent striking against object, initial encounter: Secondary | ICD-10-CM | POA: Diagnosis not present

## 2015-12-13 DIAGNOSIS — Z803 Family history of malignant neoplasm of breast: Secondary | ICD-10-CM | POA: Insufficient documentation

## 2015-12-13 DIAGNOSIS — Y92831 Amusement park as the place of occurrence of the external cause: Secondary | ICD-10-CM | POA: Diagnosis not present

## 2015-12-13 DIAGNOSIS — Z801 Family history of malignant neoplasm of trachea, bronchus and lung: Secondary | ICD-10-CM | POA: Diagnosis not present
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
## 2015-12-13 NOTE — ED Provider Notes (Signed)
CSN: [id number]     Arrival date & time 12/13/15  1441 History   First MD Initiated Contact with Patient 12/13/15 1508     Chief Complaint  Patient presents with  . Fall  . Knee Pain   (Consider location/radiation/quality/duration/timing/severity/associated sxs/prior Treatment) HPI History obtained from patient: Location: Right knee  Context/Duration: Fell while at water park Friday with grandson  Severity: 4  Quality:ache Timing:           constant Home Treatment: ace wrap, OTC meds Associated symptoms:  Hurts to weight bear Family History:cancer    Past Medical History  Diagnosis Date  . Hyperlipidemia   . Fibromyalgia   . Arthritis     osteoarthritis  . Abnormal Pap smear of cervix   . Diabetes mellitus without complication ([place])   . Adrenal nodule ([place])   . Hypertension     on no meds   . Anxiety   . Depression   . Shortness of breath     related to fibromyalgia  . UTI (lower urinary tract infection)   . Anemia   . Chronic gum disease    Past Surgical History  Procedure Laterality Date  . Tubal ligation    . Tympanoplasty    . Adenoidectomy    . Dental surgery    . Lumbar laminectomy/decompression microdiscectomy  07/25/2012    Procedure: LUMBAR LAMINECTOMY/DECOMPRESSION MICRODISCECTOMY 1 LEVEL;  Surgeon: Johnn Hai, MD;  Location: [facility];  Service: Orthopedics;  Laterality: N/A;  MICRODISCECTOMY L5-S1, FORAMINOTOMY L5-S1   Family History  Problem Relation Age of Onset  . Cancer Paternal Aunt     cervical  . Cancer Maternal Grandmother     breast  . Cancer Paternal Grandfather     lung   Social History  Substance Use Topics  . Smoking status: Current Every Day Smoker -- 1.00 packs/day for 13 years    Types: Cigarettes  . Smokeless tobacco: Never Used  . Alcohol Use: Yes     Comment: occassionally beer   OB History    Gravida Para Term Preterm AB TAB SAB Ectopic Multiple Living   3 2 2  1 1    2      Review of Systems  Denies: HEADACHE,  NAUSEA, ABDOMINAL PAIN, CHEST PAIN, CONGESTION, DYSURIA, SHORTNESS OF BREATH  Allergies  Neomycin  Home Medications   Prior to Admission medications   Medication Sig Start Date End Date Taking? Authorizing Provider  metFORMIN (GLUCOPHAGE) 500 MG tablet Take 500 mg by mouth daily with breakfast.   Yes Historical Provider, MD  b complex vitamins tablet Take 1 tablet by mouth daily.    Historical Provider, MD  cephALEXin (KEFLEX) 500 MG capsule Take 1 capsule (500 mg total) by mouth 4 (four) times daily. 10/26/14   Britt Bottom, NP  cholecalciferol (VITAMIN D) 400 UNITS TABS Take 400 Units by mouth daily.    Historical Provider, MD  Cranberry (SM CRANBERRY) 300 MG tablet Take 300 mg by mouth daily.    Historical Provider, MD  cyclobenzaprine (FLEXERIL) 10 MG tablet Take 1 tablet (10 mg total) by mouth 3 (three) times daily as needed for muscle spasms. 06/27/13   Ignacia Felling, PA-C  diclofenac (VOLTAREN) 75 MG EC tablet Take 75 mg by mouth 2 (two) times daily.    Historical Provider, MD  DULoxetine (CYMBALTA) 30 MG capsule Take 30 mg by mouth daily before breakfast.     Historical Provider, MD  Fenofibrate 150 MG CAPS Take 1 capsule by  mouth daily before breakfast. Patient takes with largest meal    Historical Provider, MD  gabapentin (NEURONTIN) 300 MG capsule Take 300 mg by mouth 3 (three) times daily.    Historical Provider, MD  HYDROcodone-acetaminophen (NORCO/VICODIN) 5-325 MG per tablet Take 1 tablet by mouth every 4 (four) hours as needed for moderate pain. 06/27/13   Ignacia Felling, PA-C  Multiple Vitamin (MULTIVITAMIN WITH MINERALS) TABS Take 1 tablet by mouth daily.    Historical Provider, MD  Omega-3 Fatty Acids (FISH OIL) 1200 MG CAPS Take 1 capsule by mouth daily.    Historical Provider, MD  ranitidine (ZANTAC) 150 MG capsule Take 150 mg by mouth at bedtime.     Historical Provider, MD  traMADol (ULTRAM) 50 MG tablet Take 50 mg by mouth every 6 (six) hours as needed. Pain     Historical Provider, MD  vitamin C (ASCORBIC ACID) 500 MG tablet Take 500 mg by mouth daily.    Historical Provider, MD   Meds Ordered and Administered this Visit  Medications - No data to display  BP 157/95 mmHg  Pulse 81  Temp(Src) 99 F (37.2 C) (Oral)  Resp 16  SpO2 98% No data found.   Physical Exam NURSES NOTES AND VITAL SIGNS REVIEWED. CONSTITUTIONAL: Well developed, well nourished, no acute distress HEENT: normocephalic, atraumatic EYES: Conjunctiva normal NECK:normal ROM, supple, no adenopathy PULMONARY:No respiratory distress, normal effort ABDOMINAL: Soft, ND, NT BS+, No CVAT MUSCULOSKELETAL: Normal ROM of all extremities, right Knee tender to prox fibula and lateral knee. Small abrasion noted.  SKIN: warm and dry without rash PSYCHIATRIC: Mood and affect, behavior are normal  ED Course  Procedures (including critical care time)  Labs Review Labs Reviewed - No data to display  Imaging Review Dg Knee Complete 4 Views Right  12/13/2015  CLINICAL DATA:  [place] [address] [place] park 2 days ago, RIGHT knee pain laterally over proximal fibula EXAM: RIGHT KNEE - COMPLETE 4+ VIEW COMPARISON:  None FINDINGS: Mild osseous demineralization. Minimal medial compartment joint space narrowing and spur formation. No acute fracture, dislocation or bone destruction. No knee joint effusion. IMPRESSION: Minimal degenerative changes. No acute osseous abnormalities. Electronically Signed   By: Lavonia Dana M.D.   On: 12/13/2015 15:52   i have reviewed x-ray images, and read report and used this information to help with MDM.   Visual Acuity Review  Right Eye Distance:   Left Eye Distance:   Bilateral Distance:    Right Eye Near:   Left Eye Near:    Bilateral Near:      Knee support,  NSAIDS Alternate cold and warm compresses.    MDM   1. Knee sprain, right, initial encounter     Patient is reassured that there are no issues that require transfer to higher level of care at this  time or additional tests. Patient is advised to continue home symptomatic treatment. Patient is advised that if there are new or worsening symptoms to attend the emergency department, contact primary care provider, or return to UC. Instructions of care provided discharged home in stable condition.    THIS NOTE WAS GENERATED USING A VOICE RECOGNITION SOFTWARE PROGRAM. ALL REASONABLE EFFORTS  WERE MADE TO PROOFREAD THIS DOCUMENT FOR ACCURACY.  I have verbally reviewed the discharge instructions with the patient. A printed AVS was given to the patient.  All questions were answered prior to discharge.      Konrad Felix, [place] 12/13/15 [phone]

## 2015-12-13 NOTE — Discharge Instructions (Signed)
Knee Sprain A knee sprain is a tear in the strong bands of tissue that connect the bones (ligaments) of your knee. HOME CARE  Raise (elevate) your injured knee to lessen puffiness (swelling).  To ease pain and puffiness, put ice on the injured area.  Put ice in a plastic bag.  Place a towel between your skin and the bag.  Leave the ice on for 20 minutes, 2-3 times a day.  Only take medicine as told by your doctor.  Do not leave your knee unprotected until pain and stiffness go away (usually 4-6 weeks).  If you have a cast or splint, do not get it wet. If your doctor told you to not take it off, cover it with a plastic bag when you shower or bathe. Do not swim.  Your doctor may have you do exercises to prevent or limit permanent weakness and stiffness. GET HELP RIGHT AWAY IF:   Your cast or splint becomes damaged.  Your pain gets worse.  You have a lot of pain, puffiness, or numbness below the cast or splint. MAKE SURE YOU:   Understand these instructions.  Will watch your condition.  Will get help right away if you are not doing well or get worse.   This information is not intended to replace advice given to you by your health care provider. Make sure you discuss any questions you have with your health care provider.   Document Released: 05/25/2009 Document Revised: 06/11/2013 Document Reviewed: 02/12/2013 Elsevier Interactive Patient Education [employer].

## 2015-12-13 NOTE — ED Notes (Signed)
Patient returned from Xray by [facility] shuttle. 

## 2015-12-23 ENCOUNTER — Ambulatory Visit (HOSPITAL_COMMUNITY)
Admission: EM | Admit: 2015-12-23 | Discharge: 2015-12-23 | Discharge status: Absent without leave | Payer: Medicare Other

## 2015-12-23 DIAGNOSIS — S8391XA Sprain of unspecified site of right knee, initial encounter: Secondary | ICD-10-CM | POA: Diagnosis not present

## 2016-07-28 ENCOUNTER — Encounter (HOSPITAL_COMMUNITY): Payer: Self-pay | Admitting: Emergency Medicine

## 2016-07-28 ENCOUNTER — Ambulatory Visit (INDEPENDENT_AMBULATORY_CARE_PROVIDER_SITE_OTHER): Payer: Medicare Other

## 2016-07-28 ENCOUNTER — Ambulatory Visit (HOSPITAL_COMMUNITY)
Admission: EM | Admit: 2016-07-28 | Discharge: 2016-07-28 | Disposition: A | Discharge status: Home / Self Care | Payer: Medicare Other | Attending: Emergency Medicine | Admitting: Emergency Medicine

## 2016-07-28 DIAGNOSIS — S93602A Unspecified sprain of left foot, initial encounter: Secondary | ICD-10-CM | POA: Diagnosis not present

## 2016-07-28 DIAGNOSIS — S99922A Unspecified injury of left foot, initial encounter: Secondary | ICD-10-CM | POA: Diagnosis not present

## 2016-07-28 MED ORDER — TRAMADOL HCL 50 MG PO TABS: 50.0000 mg | ORAL_TABLET | Freq: Four times a day (QID) | ORAL | 0 refills | Status: DC | PRN

## 2016-07-28 MED ORDER — IBUPROFEN 800 MG PO TABS: 800.0000 mg | ORAL_TABLET | Freq: Three times a day (TID) | ORAL | 0 refills | Status: DC | PRN

## 2016-07-28 NOTE — Discharge Instructions (Signed)
800 mg of ibuprofen with 1 g of Tylenol 3 times a day as needed for pain. This is an effective combination for pain.  Below is a list of primary care practices who are taking new patients for you to follow-up with.  [facility] [place], [place] [address] [phone]  [facility] [phone] [place] [address]  [facility]: [place]  [phone]  [place]: [place]   [phone]  [facility]: [address] [place]  [phone]  [place] [facility] : [address] [place] [phone]  [facility]: [address] [place] [phone]  [facility]: [place] [phone]  Dr. Blanchie Serve [place]  [phone]  Dr. Benito Mccreedy, [facility]. [place], [place] [address]  [phone]

## 2016-07-28 NOTE — ED Provider Notes (Signed)
HPI  SUBJECTIVE:  Cindy Baker is a 51 y.o. female who presents with dorsal throbbing, diffuse, intermittent left foot pain after falling off of her bed, landing on the top of a plantar flexed foot several days ago. She states the pain is present depending on activity and feels "tight". She reports occasional tingling. She is been ambulatory since the accident. Symptoms are better with elevation, worse with weightbearing. She is also tried rest and heat. No bruising, swelling, redness, numbness. She has not tried any NSAIDs. She is a past medical history of diabetes, hypertension, ulcer arthritis, fibromyalgia. No history of GI bleed, kidney disease. She does not take any narcotics on a regular basis. PMD: None.   Past Medical History:  Diagnosis Date  . Abnormal Pap smear of cervix   . Adrenal nodule ([place])   . Anemia   . Anxiety   . Arthritis    osteoarthritis  . Chronic gum disease   . Depression   . Diabetes mellitus without complication ([place])   . Fibromyalgia   . Hyperlipidemia   . Hypertension    on no meds   . Shortness of breath    related to fibromyalgia  . UTI (lower urinary tract infection)     Past Surgical History:  Procedure Laterality Date  . ADENOIDECTOMY    . DENTAL SURGERY    . LUMBAR LAMINECTOMY/DECOMPRESSION MICRODISCECTOMY  07/25/2012   Procedure: LUMBAR LAMINECTOMY/DECOMPRESSION MICRODISCECTOMY 1 LEVEL;  Surgeon: Johnn Hai, MD;  Location: [facility];  Service: Orthopedics;  Laterality: N/A;  MICRODISCECTOMY L5-S1, FORAMINOTOMY L5-S1  . TUBAL LIGATION    . TYMPANOPLASTY      Family History  Problem Relation Age of Onset  . Cancer Paternal Aunt     cervical  . Cancer Maternal Grandmother     breast  . Cancer Paternal Grandfather     lung    Social History  Substance Use Topics  . Smoking status: Current Every Day Smoker    Packs/day: 1.00    Years: 13.00    Types: Cigarettes  . Smokeless tobacco: Never Used  . Alcohol use Yes     Comment:  occassionally beer    No current facility-administered medications for this encounter.   Current Outpatient Prescriptions:  .  b complex vitamins tablet, Take 1 tablet by mouth daily., Disp: , Rfl:  .  cholecalciferol (VITAMIN D) 400 UNITS TABS, Take 400 Units by mouth daily., Disp: , Rfl:  .  Cranberry (SM CRANBERRY) 300 MG tablet, Take 300 mg by mouth daily., Disp: , Rfl:  .  cyclobenzaprine (FLEXERIL) 10 MG tablet, Take 1 tablet (10 mg total) by mouth 3 (three) times daily as needed for muscle spasms., Disp: 15 tablet, Rfl: 0 .  DULoxetine (CYMBALTA) 30 MG capsule, Take 30 mg by mouth daily before breakfast. , Disp: , Rfl:  .  Fenofibrate 150 MG CAPS, Take 1 capsule by mouth daily before breakfast. Patient takes with largest meal, Disp: , Rfl:  .  gabapentin (NEURONTIN) 300 MG capsule, Take 300 mg by mouth 3 (three) times daily., Disp: , Rfl:  .  ibuprofen (ADVIL,MOTRIN) 800 MG tablet, Take 1 tablet (800 mg total) by mouth every 8 (eight) hours as needed., Disp: 30 tablet, Rfl: 0 .  metFORMIN (GLUCOPHAGE) 500 MG tablet, Take 500 mg by mouth daily with breakfast., Disp: , Rfl:  .  Multiple Vitamin (MULTIVITAMIN WITH MINERALS) TABS, Take 1 tablet by mouth daily., Disp: , Rfl:  .  Omega-3 Fatty Acids (  FISH OIL) 1200 MG CAPS, Take 1 capsule by mouth daily., Disp: , Rfl:  .  ranitidine (ZANTAC) 150 MG capsule, Take 150 mg by mouth at bedtime. , Disp: , Rfl:  .  traMADol (ULTRAM) 50 MG tablet, Take 1 tablet (50 mg total) by mouth every 6 (six) hours as needed., Disp: 20 tablet, Rfl: 0 .  vitamin C (ASCORBIC ACID) 500 MG tablet, Take 500 mg by mouth daily., Disp: , Rfl:   Allergies  Allergen Reactions  . Neomycin Other (See Comments)    Eyes swelling     ROS  As noted in HPI.   Physical Exam  BP 124/78 (BP Location: Right Arm)   Pulse 93   Temp 97.9 F (36.6 C) (Oral)   Resp 20   SpO2 100%   Constitutional: Well developed, well nourished, no acute distress Eyes:  EOMI,  conjunctiva normal bilaterally HENT: Normocephalic, atraumatic,mucus membranes moist Respiratory: Normal inspiratory effort Cardiovascular: Normal rate GI: nondistended skin: No rash, skin intact Musculoskeletal: Tenderness over the left foot along first and second metatarsals and at the MTP joints. No bruising, swelling. midfoot NT.  Base of fifth metatarsal NT.  Skin intact.  Refill less than 2 seconds. Sensation grossly intact. Patient able to move all toes actively.   no pain with inversion / eversion. No Tenderness along the plantar fascia. Distal fibula NT, Medial malleolus NT,  Deltoid ligament NT, Lateral ligaments NT, Achilles NT. Patient able to bear weight while in department. Neurologic: Alert & oriented x 3, no focal neuro deficits Psychiatric: Speech and behavior appropriate   ED Course   Medications - No data to display  Orders Placed This Encounter  Procedures  . DG Foot Complete Left    Standing Status:   Standing    Number of Occurrences:   1    Order Specific Question:   Reason for Exam (SYMPTOM  OR DIAGNOSIS REQUIRED)    Answer:   fell off bed onto carpet flooring... hurting at base of foot.    Order Specific Question:   Is patient pregnant?    Answer:   No    No results found for this or any previous visit (from the past 24 hour(s)). Dg Foot Complete Left  Result Date: 07/28/2016 CLINICAL DATA:  [place] [address] off bed, left foot injury. EXAM: LEFT FOOT - COMPLETE 3+ VIEW COMPARISON:  None. FINDINGS: Small plantar calcaneal spur. Early degenerative changes at the first MTP joint. No acute bony abnormality. Specifically, no fracture, subluxation, or dislocation. Soft tissues are intact. IMPRESSION: No acute bony abnormality. Electronically Signed   By: Rolm Baptise M.D.   On: 07/28/2016 11:55    ED Clinical Impression  Sprain of left foot, initial encounter   ED Assessment/Plan  [facility] narcotic database reviewed. No opiate prescriptions in the past 6  months.  Imaging independently reviewed. No fracture, dislocation, subluxation. See radiology report for details.  Presentation consistent with a sprain of her foot. Advised ibuprofen 800 mg of Tylenol 1 g 3 times a day, tramadol for severe pain. States that she has taken this before. Continue rest, heat or ice, whichever feels better, elevation. We'll provide primary care referral list as patient states that she is looking for a new doctor.  Discussed imaging, MDM, plan and followup with patient.  Patient agrees with plan.   Meds ordered this encounter  Medications  . ibuprofen (ADVIL,MOTRIN) 800 MG tablet    Sig: Take 1 tablet (800 mg total) by mouth every 8 (eight)  hours as needed.    Dispense:  30 tablet    Refill:  0  . traMADol (ULTRAM) 50 MG tablet    Sig: Take 1 tablet (50 mg total) by mouth every 6 (six) hours as needed.    Dispense:  20 tablet    Refill:  0    *This clinic note was created using [employer]. Therefore, there may be occasional mistakes despite careful proofreading.  ?   Melynda Ripple, MD 07/29/16 1705

## 2016-07-28 NOTE — ED Triage Notes (Signed)
Here for left foot pain/inj onset 5-6 days  Reports she fell off bed and onto carpet flooring  Pain increases w/activity.   Slow gait... A&O x4... NAD

## 2016-09-08 ENCOUNTER — Encounter: Payer: Self-pay | Admitting: Family Medicine

## 2016-09-08 ENCOUNTER — Ambulatory Visit (INDEPENDENT_AMBULATORY_CARE_PROVIDER_SITE_OTHER): Payer: Medicare Other | Admitting: Family Medicine

## 2016-09-08 VITALS — BP 119/78 | HR 97 | Temp 98.3°F | Resp 18 | Ht 64.0 in | Wt 177.0 lb

## 2016-09-08 DIAGNOSIS — Z23 Encounter for immunization: Secondary | ICD-10-CM | POA: Diagnosis not present

## 2016-09-08 DIAGNOSIS — R319 Hematuria, unspecified: Secondary | ICD-10-CM

## 2016-09-08 DIAGNOSIS — G8929 Other chronic pain: Secondary | ICD-10-CM

## 2016-09-08 DIAGNOSIS — E7800 Pure hypercholesterolemia, unspecified: Secondary | ICD-10-CM | POA: Diagnosis not present

## 2016-09-08 DIAGNOSIS — E559 Vitamin D deficiency, unspecified: Secondary | ICD-10-CM | POA: Diagnosis not present

## 2016-09-08 DIAGNOSIS — F329 Major depressive disorder, single episode, unspecified: Secondary | ICD-10-CM | POA: Diagnosis not present

## 2016-09-08 DIAGNOSIS — M1990 Unspecified osteoarthritis, unspecified site: Secondary | ICD-10-CM | POA: Diagnosis not present

## 2016-09-08 DIAGNOSIS — E039 Hypothyroidism, unspecified: Secondary | ICD-10-CM | POA: Diagnosis not present

## 2016-09-08 DIAGNOSIS — M797 Fibromyalgia: Secondary | ICD-10-CM

## 2016-09-08 DIAGNOSIS — F17200 Nicotine dependence, unspecified, uncomplicated: Secondary | ICD-10-CM

## 2016-09-08 DIAGNOSIS — N390 Urinary tract infection, site not specified: Secondary | ICD-10-CM | POA: Diagnosis not present

## 2016-09-08 DIAGNOSIS — F32A Depression, unspecified: Secondary | ICD-10-CM

## 2016-09-08 DIAGNOSIS — E119 Type 2 diabetes mellitus without complications: Secondary | ICD-10-CM

## 2016-09-08 LAB — COMPLETE METABOLIC PANEL WITH GFR
ALT: 10 U/L (ref 6–29)
AST: 10 U/L (ref 10–35)
Albumin: 4.3 g/dL (ref 3.6–5.1)
Alkaline Phosphatase: 75 U/L (ref 33–115)
BUN: 11 mg/dL (ref 7–25)
CO2: 22 mmol/L (ref 20–31)
Calcium: 9.7 mg/dL (ref 8.6–10.2)
Chloride: 103 mmol/L (ref 98–110)
Creat: 0.87 mg/dL (ref 0.50–1.10)
GFR, Est African American: >89 mL/min (ref >=60)
GFR, Est Non African American: 78 mL/min (ref >=60)
Glucose, Bld: 251 mg/dL — ABNORMAL HIGH (ref 65–99)
Potassium: 4.7 mmol/L (ref 3.5–5.3)
Sodium: 137 mmol/L (ref 135–146)
Total Bilirubin: 0.2 mg/dL (ref 0.2–1.2)
Total Protein: 7.2 g/dL (ref 6.1–8.1)

## 2016-09-08 LAB — POCT URINALYSIS DIP (DEVICE)
Bilirubin Urine: NEGATIVE
Glucose, UA: NEGATIVE mg/dL
Ketones, ur: NEGATIVE mg/dL
Nitrite: POSITIVE — AB
Protein, ur: 30 mg/dL — AB
Specific Gravity, Urine: 1.025 (ref 1.005–1.030)
Urobilinogen, UA: 0.2 mg/dL (ref 0.0–1.0)
pH: 5.5 (ref 5.0–8.0)

## 2016-09-08 LAB — TSH: TSH: 2.09 mIU/L

## 2016-09-08 LAB — POCT GLYCOSYLATED HEMOGLOBIN (HGB A1C): Hemoglobin A1C: 9.3

## 2016-09-08 MED ORDER — SITAGLIPTIN-METFORMIN HCL 50-1000 MG PO TABS
1.0000 | ORAL_TABLET | Freq: Two times a day (BID) | ORAL | 5 refills | Status: DC
Start: 2016-09-08 — End: 2017-01-16

## 2016-09-08 MED ORDER — DULOXETINE HCL 30 MG PO CPEP: 30.0000 mg | ORAL_CAPSULE | Freq: Every day | ORAL | 5 refills | Status: DC

## 2016-09-08 MED ORDER — GLUCOSE BLOOD VI STRP: ORAL_STRIP | 12 refills | Status: DC

## 2016-09-08 MED ORDER — SULFAMETHOXAZOLE-TRIMETHOPRIM 800-160 MG PO TABS: 1.0000 | ORAL_TABLET | Freq: Two times a day (BID) | ORAL | 0 refills | Status: DC

## 2016-09-08 MED ORDER — GABAPENTIN 300 MG PO CAPS: 300.0000 mg | ORAL_CAPSULE | Freq: Three times a day (TID) | ORAL | 1 refills | Status: DC

## 2016-09-08 NOTE — Patient Instructions (Addendum)
Depression: Will continue Cymbalta 30 mg daily for depression. Also, sent referral to psychiatry Fibromyalgia and chronic pain: Will continue gabapentin 300 mg three times per day Sent referral to [facility] Will send order for mammogram Received Tdap and pneumococcal 23 on today Janumet 50-100 take 1 tablet with breakfast and 1 with dinner.  Recommend a lowfat, low carbohydrate diet divided over 5-6 small meals, increase water intake to 6-8 glasses, and 150 minutes per week of cardiovascular exercise.     Will follow up in 1 month to address additional chronic conditions   Diabetes Mellitus and Food It is important for you to manage your blood sugar (glucose) level. Your blood glucose level can be greatly affected by what you eat. Eating healthier foods in the appropriate amounts throughout the day at about the same time each day will help you control your blood glucose level. It can also help slow or prevent worsening of your diabetes mellitus. Healthy eating may even help you improve the level of your blood pressure and reach or maintain a healthy weight. General recommendations for healthful eating and cooking habits include:  Eating meals and snacks regularly. Avoid going long periods of time without eating to lose weight.  Eating a diet that consists mainly of plant-based foods, such as fruits, vegetables, nuts, legumes, and whole grains.  Using low-heat cooking methods, such as baking, instead of high-heat cooking methods, such as deep frying. Work with your dietitian to make sure you understand how to use the Nutrition Facts information on food labels. How can food affect me? Carbohydrates  Carbohydrates affect your blood glucose level more than any other type of food. Your dietitian will help you determine how many carbohydrates to eat at each meal and teach you how to count carbohydrates. Counting carbohydrates is important to keep your blood glucose at a healthy level,  especially if you are using insulin or taking certain medicines for diabetes mellitus. Alcohol  Alcohol can cause sudden decreases in blood glucose (hypoglycemia), especially if you use insulin or take certain medicines for diabetes mellitus. Hypoglycemia can be a life-threatening condition. Symptoms of hypoglycemia (sleepiness, dizziness, and disorientation) are similar to symptoms of having too much alcohol. If your health care provider has given you approval to drink alcohol, do so in moderation and use the following guidelines:  Women should not have more than one drink per day, and men should not have more than two drinks per day. One drink is equal to:  12 oz of beer.  5 oz of wine.  1 oz of hard liquor.  Do not drink on an empty stomach.  Keep yourself hydrated. Have water, diet soda, or unsweetened iced tea.  Regular soda, juice, and other mixers might contain a lot of carbohydrates and should be counted. What foods are not recommended? As you make food choices, it is important to remember that all foods are not the same. Some foods have fewer nutrients per serving than other foods, even though they might have the same number of calories or carbohydrates. It is difficult to get your body what it needs when you eat foods with fewer nutrients. Examples of foods that you should avoid that are high in calories and carbohydrates but low in nutrients include:  Trans fats (most processed foods list trans fats on the Nutrition Facts label).  Regular soda.  Juice.  Candy.  Sweets, such as cake, pie, doughnuts, and cookies.  Fried foods. What foods can I eat? Eat nutrient-rich foods, which  will nourish your body and keep you healthy. The food you should eat also will depend on several factors, including:  The calories you need.  The medicines you take.  Your weight.  Your blood glucose level.  Your blood pressure level.  Your cholesterol level. You should eat a variety of  foods, including:  Protein.  Lean cuts of meat.  Proteins low in saturated fats, such as fish, egg whites, and beans. Avoid processed meats.  Fruits and vegetables.  Fruits and vegetables that may help control blood glucose levels, such as apples, mangoes, and yams.  Dairy products.  Choose fat-free or low-fat dairy products, such as milk, yogurt, and cheese.  Grains, bread, pasta, and rice.  Choose whole grain products, such as multigrain bread, whole oats, and brown rice. These foods may help control blood pressure.  Fats.  Foods containing healthful fats, such as nuts, avocado, olive oil, canola oil, and fish. Does everyone with diabetes mellitus have the same meal plan? Because every person with diabetes mellitus is different, there is not one meal plan that works for everyone. It is very important that you meet with a dietitian who will help you create a meal plan that is just right for you. This information is not intended to replace advice given to you by your health care provider. Make sure you discuss any questions you have with your health care provider. Document Released: 03/03/2005 Document Revised: 11/12/2015 Document Reviewed: 05/03/2013 Elsevier Interactive Patient Education  2017 [employer]  Chronic Pain, Adult Chronic pain is a type of pain that lasts or keeps coming back (recurs) for at least six months. You may have chronic headaches, abdominal pain, or body pain. Chronic pain may be related to an illness, such as fibromyalgia or complex regional pain syndrome. Sometimes the cause of chronic pain is not known. Chronic pain can make it hard for you to do daily activities. If not treated, chronic pain can lead to other health problems, including anxiety and depression. Treatment depends on the cause and severity of your pain. You may need to work with a pain specialist to come up with a treatment plan. The plan may include medicine, counseling, and physical  therapy. Many people benefit from a combination of two or more types of treatment to control their pain. Follow these instructions at home: Lifestyle   Consider keeping a pain diary to share with your health care providers.  Consider talking with a mental health care provider (psychologist) about how to cope with chronic pain.  Consider joining a chronic pain support group.  Try to control or lower your stress levels. Talk to your health care provider about strategies to do this. General instructions    Take over-the-counter and prescription medicines only as told by your health care provider.  Follow your treatment plan as told by your health care provider. This may include:  Gentle, regular exercise.  Eating a healthy diet that includes foods such as vegetables, fruits, fish, and lean meats.  Cognitive or behavioral therapy.  Working with a Community education officer.  Meditation or yoga.  Acupuncture or massage therapy.  Aroma, color, light, or sound therapy.  Local electrical stimulation.  Shots (injections) of numbing or pain-relieving medicines into the spine or the area of pain.  Check your pain level as told by your health care provider. Ask your health care provider if you should use a pain scale.  Learn as much as you can about how to manage your chronic pain. Ask  your health care provider if an intensive pain rehabilitation program or a chronic pain specialist would be helpful.  Keep all follow-up visits as told by your health care provider. This is important. Contact a health care provider if:  Your pain gets worse.  You have new pain.  You have trouble sleeping.  You have trouble doing your normal activities.  Your pain is not controlled with treatment.  Your have side effects from pain medicine.  You feel weak. Get help right away if:  You lose feeling or have numbness in your body.  You lose control of bowel or bladder function.  Your pain suddenly  gets much worse.  You develop shaking or chills.  You develop confusion.  You develop chest pain.  You have trouble breathing or shortness of breath.  You pass out.  You have thoughts about hurting yourself or others. This information is not intended to replace advice given to you by your health care provider. Make sure you discuss any questions you have with your health care provider. Document Released: 02/26/2002 Document Revised: 02/04/2016 Document Reviewed: 11/24/2015 Elsevier Interactive Patient Education  2017 [employer].

## 2016-09-08 NOTE — Progress Notes (Signed)
Subjective:    Patient ID: Cindy Baker, female    DOB: May 17, 1966, 51 y.o.   MRN: [id number]  HPI  Cindy Baker, a 51 year old female with a history of diabetes mellitus type 2, hypothyroidism, chronic low back pain, fibromyalgia and depression presents to establish care. She says that she was a patient of Dr. Berdine Addison at the [facility] and is changing providers for personal reasons. She has multiple complaints.  [name] has a history of depression. She says that she has been consistently taking Metformin. She is not following a carbohydrate modified diet or exercise regimen. Current symptoms of DMII consists of neuropathy.  Patient denies foot ulcerations, nausea, polydipsia, polyuria, visual disturbances, vomitting and weight loss.   She also has a history of fibromyalgia. She says that she was diagnosed with condition many years ago and was followed by rheumatology. She says that she has been lost to follow up.  The symptoms are of moderate severity. They are made worse by: cold exposure, kneeling, lying down, sitting, standing and walking. They are helped by Gabapentin, but she has been out of medication. . Patient denies associated alopecia, bleeding/clotting problems, memory loss and rashes/photosensitive. Patient reports that she has been unable to work due to chronic pain.  Patient complains of depression. She complains of anhedonia, depressed mood and insomnia. Onset was several years ago. O  She denies current suicidal and homicidal plan or intent. Patient is on Cymbalta with moderate relief.    Patient complains of chronic low back pain.  It was not related to a remote injury and a fall. The pain is rated moderate, and is located at the lumbar-sacral region. The pain is described as constant and throbbing. Symptoms are exacerbated by lying down, running, sitting, standing, straining and twisting. Factors which relieve the pain include muscle relaxants, narcotic pain medications  and NSAIDs.    Past Medical History:  Diagnosis Date  . Abnormal Pap smear of cervix   . Adrenal nodule ([place])   . Anemia   . Anxiety   . Arthritis    osteoarthritis  . Chronic gum disease   . Depression   . Diabetes mellitus without complication ([place])   . Fibromyalgia   . Hyperlipidemia   . Hypertension    on no meds   . Shortness of breath    related to fibromyalgia  . UTI (lower urinary tract infection)    Immunization History  Administered Date(s) Administered  . Influenza Split 06/30/2011  . Influenza Whole 04/29/2009, 06/08/2010  . Pneumococcal Polysaccharide-23 09/08/2016  . Td 11/18/2000  . Tdap 09/08/2016   Allergies  Allergen Reactions  . Neomycin Other (See Comments)    Eyes swelling   Review of Systems  HENT: Negative.   Eyes: Negative.   Respiratory: Negative.   Cardiovascular: Negative.  Negative for chest pain, palpitations and leg swelling.  Gastrointestinal: Negative.   Endocrine: Negative.  Negative for cold intolerance, heat intolerance, polydipsia, polyphagia and polyuria.  Genitourinary: Positive for flank pain. Negative for hematuria.  Musculoskeletal: Positive for arthralgias and myalgias.  Skin: Negative.   Allergic/Immunologic: Negative for immunocompromised state.  Neurological: Positive for weakness and numbness. Negative for dizziness, facial asymmetry, speech difficulty, light-headedness and headaches.  Hematological: Negative.   Psychiatric/Behavioral: Negative.        Depression      Objective:   Physical Exam  Constitutional: She is oriented to person, place, and time. She appears well-developed and well-nourished.  HENT:  Head: Normocephalic  and atraumatic.  Right Ear: External ear normal.  Left Ear: External ear normal.  Nose: Nose normal.  Mouth/Throat: Oropharynx is clear and moist.  Eyes: Conjunctivae and EOM are normal. Pupils are equal, round, and reactive to light.  Neck: Normal range of motion. Neck supple.   Pulmonary/Chest: Effort normal and breath sounds normal.  Abdominal: Soft. Bowel sounds are normal.  Musculoskeletal: She exhibits no edema or deformity.       Right shoulder: She exhibits decreased range of motion and tenderness.       Left shoulder: She exhibits decreased range of motion and tenderness.       Right knee: She exhibits decreased range of motion.       Lumbar back: She exhibits decreased range of motion, pain and spasm.  Neurological: She is alert and oriented to person, place, and time. She has normal reflexes.  Skin: Skin is warm and dry.  Psychiatric: She has a normal mood and affect. Her behavior is normal. Judgment and thought content normal.      BP 119/78 (BP Location: Right Arm, Patient Position: Sitting, Cuff Size: Large)   Pulse 97   Temp 98.3 F (36.8 C) (Oral)   Resp 18   Ht 5\' 4"  (1.626 m)   Wt 177 lb (80.3 kg)   LMP  (LMP Unknown)   SpO2 99%   BMI 30.38 kg/m  Assessment & Plan:  1. Hypothyroidism, unspecified type - TSH  2. HYPERCHOLESTEROLEMIA - gemfibrozil (LOPID) 600 MG tablet; Take 600 mg by mouth 2 (two) times daily before a meal. - Lipid Panel; Future  3. Type 2 diabetes mellitus without complication, without long-term current use of insulin (HCC) previously - COMPLETE METABOLIC PANEL WITH GFR - HgB A1c - Microalbumin/Creatinine Ratio, Urine - sitaGLIPtin-metformin (JANUMET) 50-1000 MG tablet; Take 1 tablet by mouth 2 (two) times daily with a meal.  Dispense: 60 tablet; Refill: 5 - POCT urinalysis dip (device) - glucose blood test strip; Check fasting blood sugar in am and 2 hours after breakfast  Dispense: 100 each; Refill: 12  4. Osteoarthritis, unspecified osteoarthritis type, unspecified site - AMB referral to orthopedics  5. Other chronic pain - Ambulatory referral to Pain Clinic  6. Depression, unspecified depression type - DULoxetine (CYMBALTA) 30 MG capsule; Take 1 capsule (30 mg total) by mouth daily before breakfast.   Dispense: 30 capsule; Refill: 5 - Ambulatory referral to Psychiatry  7. Fibromyalgia - diclofenac (VOLTAREN) 75 MG EC tablet; Take 75 mg by mouth 2 (two) times daily. - gabapentin (NEURONTIN) 300 MG capsule; Take 1 capsule (300 mg total) by mouth 3 (three) times daily.  Dispense: 90 capsule; Refill: 1  8. Vitamin D deficiency - Vitamin D, 25-hydroxy  9. Need for Tdap vaccination - Tdap vaccine greater than or equal to 7yo IM  10. TOBACCO DEPENDENCE Smoking cessation instruction/counseling given:  counseled patient on the dangers of tobacco use, advised patient to stop smoking, and reviewed strategies to maximize success  11. Immunization due - Pneumococcal polysaccharide vaccine 23-valent greater than or equal to 2yo subcutaneous/IM  12. Urinary tract infection with hematuria, site unspecified - sulfamethoxazole-trimethoprim (BACTRIM DS,SEPTRA DS) 800-160 MG tablet; Take 1 tablet by mouth 2 (two) times daily.  Dispense: 20 tablet; Refill: 0 - Urine culture   RTC: 1 month for chronic conditions   Melvyn Hommes Al Decant MSN, FNP-C [facility] [address] [place], [place] [address] [phone]

## 2016-09-09 LAB — VITAMIN D 25 HYDROXY (VIT D DEFICIENCY, FRACTURES): Vit D, 25-Hydroxy: 8 ng/mL — ABNORMAL LOW (ref 30–100)

## 2016-09-09 LAB — MICROALBUMIN / CREATININE URINE RATIO
Creatinine, Urine: 189 mg/dL (ref 20–320)
Microalb Creat Ratio: 20 mcg/mg creat (ref <30)
Microalb, Ur: 3.8 mg/dL

## 2016-09-10 LAB — URINE CULTURE

## 2016-09-12 ENCOUNTER — Telehealth: Payer: Self-pay

## 2016-09-12 NOTE — Telephone Encounter (Signed)
Called and left message advising patient that urine was growing bacteria and that she needs to finish all antibiotic as directed. Asked that patient increase water intake and practice good hygiene wiping from front to back. Asked if patient has any problem or question to call back to our office. Thanks!

## 2016-09-12 NOTE — Telephone Encounter (Signed)
-----   Message from Dorena Dew, [place] sent at 09/11/2016 12:07 AM EDT ----- Regarding: lab results Ms. Wrisley has Klebsiella growing in urine, please call to make sure that she takes antibiotic, increase water intake, wipes from front to back, and practice good perianal hygiene.   Thanks ----- Message ----- From: Adelina Mings, LPN Sent: 03/14/4627   3:28 PM To: Dorena Dew, FNP

## 2016-10-10 ENCOUNTER — Ambulatory Visit: Payer: Medicare Other | Admitting: Family Medicine

## 2016-10-11 ENCOUNTER — Encounter: Payer: Self-pay | Admitting: Family Medicine

## 2016-10-11 ENCOUNTER — Ambulatory Visit (INDEPENDENT_AMBULATORY_CARE_PROVIDER_SITE_OTHER): Payer: Medicare Other | Admitting: Family Medicine

## 2016-10-11 VITALS — BP 115/73 | HR 90 | Temp 98.1°F | Resp 18 | Ht 64.0 in | Wt 174.0 lb

## 2016-10-11 DIAGNOSIS — E559 Vitamin D deficiency, unspecified: Secondary | ICD-10-CM

## 2016-10-11 DIAGNOSIS — F17200 Nicotine dependence, unspecified, uncomplicated: Secondary | ICD-10-CM

## 2016-10-11 DIAGNOSIS — E7800 Pure hypercholesterolemia, unspecified: Secondary | ICD-10-CM

## 2016-10-11 DIAGNOSIS — M797 Fibromyalgia: Secondary | ICD-10-CM

## 2016-10-11 DIAGNOSIS — E118 Type 2 diabetes mellitus with unspecified complications: Secondary | ICD-10-CM

## 2016-10-11 DIAGNOSIS — M2550 Pain in unspecified joint: Secondary | ICD-10-CM

## 2016-10-11 LAB — LIPID PANEL
Cholesterol: 233 mg/dL — ABNORMAL HIGH (ref <200)
HDL: 32 mg/dL — ABNORMAL LOW (ref >50)
LDL Cholesterol: 154 mg/dL — ABNORMAL HIGH (ref <100)
Total CHOL/HDL Ratio: 7.3 Ratio — ABNORMAL HIGH (ref <5.0)
Triglycerides: 233 mg/dL — ABNORMAL HIGH (ref <150)
VLDL: 47 mg/dL — ABNORMAL HIGH (ref <30)

## 2016-10-11 LAB — POCT GLYCOSYLATED HEMOGLOBIN (HGB A1C): Hemoglobin A1C: 8.5

## 2016-10-11 MED ORDER — GABAPENTIN 300 MG PO CAPS: 300.0000 mg | ORAL_CAPSULE | Freq: Three times a day (TID) | ORAL | 5 refills | Status: DC

## 2016-10-11 MED ORDER — EXENATIDE 5 MCG/0.02ML ~~LOC~~ SOPN: 5.0000 ug | PEN_INJECTOR | Freq: Two times a day (BID) | SUBCUTANEOUS | 11 refills | Status: DC

## 2016-10-11 NOTE — Progress Notes (Signed)
Subjective:    Patient ID: Cindy Baker, female    DOB: [birth year], 51 y.o.   MRN: [id number]  [name], a 51 year old female presents for a follow up of diabetes mellitus and hyperlipidemia. Previous hemoglobin a1C was 9.3. Patient was started on Janumet 50-1000 mg and has been taking medication consistently. She also says that she has been following a low sodium, lowfat, low carbohydrate diet. She has not been exercising. She does not check blood sugars at home.    Diabetes  She presents for her follow-up diabetic visit. She has type 2 diabetes mellitus. No MedicAlert identification noted. Her disease course has been stable. There are no hypoglycemic associated symptoms. Pertinent negatives for hypoglycemia include no confusion, dizziness, headaches, mood changes, nervousness/anxiousness, pallor, seizures or speech difficulty. Associated symptoms include foot paresthesias and weakness. Pertinent negatives for diabetes include no blurred vision, no chest pain, no fatigue, no polydipsia, no polyphagia and no polyuria. Symptoms are stable. Diabetic complications include nephropathy. Pertinent negatives for diabetic complications include no autonomic neuropathy, CVA, heart disease, PVD or retinopathy. Current diabetic treatment includes oral agent (dual therapy). She has not had a previous visit with a dietitian. She never participates in exercise. There is no change in her home blood glucose trend. An ACE inhibitor/angiotensin II receptor blocker is being taken. She does not see a podiatrist.Eye exam is not current.  Hyperlipidemia  Associated symptoms include myalgias. Pertinent negatives include no chest pain.    Patient complains of chronic low back pain.  It was not related to a remote injury and a fall. The pain is rated moderate, and is located at the lumbar-sacral region. The pain is described as constant and throbbing. Symptoms are exacerbated by lying down, running, sitting,  standing, straining and twisting. Factors which relieve the pain include muscle relaxants, narcotic pain medications and NSAIDs.    Past Medical History:  Diagnosis Date  . Abnormal Pap smear of cervix   . Adrenal nodule ([place])   . Anemia   . Anxiety   . Arthritis    osteoarthritis  . Chronic gum disease   . Depression   . Diabetes mellitus without complication ([place])   . Fibromyalgia   . Hyperlipidemia   . Hypertension    on no meds   . Shortness of breath    related to fibromyalgia  . UTI (lower urinary tract infection)    Immunization History  Administered Date(s) Administered  . Influenza Split 06/30/2011  . Influenza Whole 04/29/2009, 06/08/2010  . Pneumococcal Polysaccharide-23 09/08/2016  . Td 11/18/2000  . Tdap 09/08/2016   Allergies  Allergen Reactions  . Neomycin Other (See Comments)    Eyes swelling   Review of Systems  Constitutional: Negative for fatigue.  HENT: Negative.   Eyes: Negative.  Negative for blurred vision.  Respiratory: Negative.   Cardiovascular: Negative.  Negative for chest pain, palpitations and leg swelling.  Gastrointestinal: Negative.   Endocrine: Negative.  Negative for cold intolerance, heat intolerance, polydipsia, polyphagia and polyuria.  Genitourinary: Positive for flank pain. Negative for hematuria.  Musculoskeletal: Positive for arthralgias and myalgias.  Skin: Negative.  Negative for pallor.  Allergic/Immunologic: Negative for immunocompromised state.  Neurological: Positive for weakness and numbness. Negative for dizziness, seizures, facial asymmetry, speech difficulty, light-headedness and headaches.  Hematological: Negative.   Psychiatric/Behavioral: Negative.  Negative for confusion. The patient is not nervous/anxious.        Depression      Objective:   Physical Exam  Constitutional: She is oriented to person, place, and time. She appears well-developed and well-nourished.  HENT:  Head: Normocephalic and atraumatic.   Right Ear: External ear normal.  Left Ear: External ear normal.  Nose: Nose normal.  Mouth/Throat: Oropharynx is clear and moist.  Eyes: Conjunctivae and EOM are normal. Pupils are equal, round, and reactive to light.  Neck: Normal range of motion. Neck supple.  Pulmonary/Chest: Effort normal and breath sounds normal.  Abdominal: Soft. Bowel sounds are normal.  Musculoskeletal: She exhibits no edema or deformity.       Right shoulder: She exhibits decreased range of motion and tenderness.       Left shoulder: She exhibits decreased range of motion and tenderness.       Right knee: She exhibits decreased range of motion.       Lumbar back: She exhibits decreased range of motion, pain and spasm.  Neurological: She is alert and oriented to person, place, and time. She has normal reflexes.  Skin: Skin is warm and dry.  Psychiatric: She has a normal mood and affect. Her behavior is normal. Judgment and thought content normal.      BP 115/73 (BP Location: Right Arm, Patient Position: Sitting, Cuff Size: Normal)   Pulse 90   Temp 98.1 F (36.7 C) (Oral)   Resp 18   Ht 5\' 4"  (1.626 m)   Wt 174 lb (78.9 kg)   SpO2 99%   BMI 29.87 kg/m  Assessment & Plan:  1. Type 2 diabetes mellitus with complication, unspecified whether long term insulin use (HCC) Hemoglobin a1C is 8.5, will continue Janumet and add [place] twice daily with meals. Will follow up in 1 month.  - HgB A1c - exenatide (BYETTA) 5 MCG/0.02ML SOPN injection; Inject 0.02 mLs (5 mcg total) into the skin 2 (two) times daily with a meal.  Dispense: 1 pen; Refill: 11 -Referral to opthalmology 2. HYPERCHOLESTEROLEMIA The patient is asked to make an attempt to improve diet and exercise patterns to aid in medical management of this problem.  - Lipid Panel  3. Vitamin D deficiency - Vitamin D, 25-hydroxy  4. TOBACCO DEPENDENCE Smoking cessation instruction/counseling given:  counseled patient on the dangers of tobacco use, advised  patient to stop smoking, and reviewed strategies to maximize success  5. Arthralgia, unspecified joint - ANA - Rheumatoid factor - C-reactive protein - Sedimentation Rate  6. Fibromyalgia - gabapentin (NEURONTIN) 300 MG capsule; Take 1 capsule (300 mg total) by mouth 3 (three) times daily.  Dispense: 90 capsule; Refill: 5   RTC: 1 month for pap smear and DMII   [place] Al Decant  MSN, FNP-C [facility] [place], [place] [address] [phone]

## 2016-10-11 NOTE — Patient Instructions (Addendum)
Diabetes mellitus:  Hemoglobin a1C is 8.5, will add Byetta 5 mcg twice daily with meals for better diabetic controls. Please continue with medication regimen.  Recommend a lowfat, low carbohydrate diet divided over 5-6 small meals, increase water intake to 6-8 glasses, and 150 minutes per week of cardiovascular exercise.  Exenatide injection solution What is this medicine? EXENATIDE (ex EN a tide) is used to improve blood sugar control in adults with type 2 diabetes. This medicine may be used with other oral diabetes medicines. This medicine may be used for other purposes; ask your health care provider or pharmacist if you have questions. COMMON BRAND NAME(S): Byetta What should I tell my health care provider before I take this medicine? They need to know if you have any of these conditions: -history of pancreatitis -kidney disease or if you are on dialysis -stomach or intestine problems -an unusual or allergic reaction to exenatide, medicines, foods, dyes, or preservatives -pregnant or trying to get pregnant -breast-feeding How should I use this medicine? This medicine is for injection under the skin of your upper leg, stomach area, or upper arm. You will be taught how to prepare and give this medicine. Use exactly as directed. Take your medicine at regular intervals. Do not take it more often than directed. It is important that you put your used needles and syringes in a special sharps container. Do not put them in a trash can. If you do not have a sharps container, call your pharmacist or healthcare provider to get one. A special MedGuide will be given to you by the pharmacist with each prescription and refill. Be sure to read this information carefully each time.Talk to your pediatrician regarding the use of this medicine in children. Special care may be needed. Overdosage: If you think you have taken too much of this medicine contact a poison control center or emergency room at once. NOTE:  This medicine is only for you. Do not share this medicine with others. What if I miss a dose? If you miss a dose, take it as soon as you can. If it is almost time for your next dose, take only that dose. Do not take double or extra doses. What may interact with this medicine? -acetaminophen -birth control pills -digoxin -insulin and other medicines for diabetes -lisinopril -lovastatin -warfarin Many medications may cause changes in blood sugar, these include: -alcohol containing beverages -antiviral medicines for HIV or AIDS -aspirin and aspirin-like drugs -certain medicines for blood pressure, heart disease, irregular heart beat -chromium -diuretics -female hormones, such as estrogens or progestins, birth control pills -fenofibrate -gemfibrozil -isoniazid -lanreotide -female hormones or anabolic steroids -MAOIs like Carbex, Eldepryl, Marplan, Nardil, and Parnate -medicines for weight loss -medicines for allergies, asthma, cold, or cough -medicines for depression, anxiety, or psychotic disturbances -niacin -nicotine -NSAIDs, medicines for pain and inflammation, like ibuprofen or naproxen -octreotide -pasireotide -pentamidine -phenytoin -probenecid -quinolone antibiotics such as ciprofloxacin, levofloxacin, ofloxacin -some herbal dietary supplements -steroid medicines such as prednisone or cortisone -sulfamethoxazole; trimethoprim -thyroid hormones Some medications can hide the warning symptoms of low blood sugar (hypoglycemia). You may need to monitor your blood sugar more closely if you are taking one of these medications. These include: -beta-blockers, often used for high blood pressure or heart problems (examples include atenolol, metoprolol, propranolol) -clonidine -guanethidine -reserpine This list may not describe all possible interactions. Give your health care provider a list of all the medicines, herbs, non-prescription drugs, or dietary supplements you use. Also  tell them if you smoke,  drink alcohol, or use illegal drugs. Some items may interact with your medicine. What should I watch for while using this medicine? Visit your doctor or health care professional for regular checks on your progress. A test called the HbA1C (A1C) will be monitored. This is a simple blood test. It measures your blood sugar control over the last 2 to 3 months. You will receive this test every 3 to 6 months. Learn how to check your blood sugar. Learn the symptoms of low and high blood sugar and how to manage them. Always carry a quick-source of sugar with you in case you have symptoms of low blood sugar. Examples include hard sugar candy or glucose tablets. Make sure others know that you can choke if you eat or drink when you develop serious symptoms of low blood sugar, such as seizures or unconsciousness. They must get medical help at once. Tell your doctor or health care professional if you have high blood sugar. You might need to change the dose of your medicine. If you are sick or exercising more than usual, you might need to change the dose of your medicine. Do not skip meals. Ask your doctor or health care professional if you should avoid alcohol. Many nonprescription cough and cold products contain sugar or alcohol. These can affect blood sugar. Pens and cartridges should never be shared. Even if the needle is changed, sharing may result in passing of viruses like hepatitis or HIV. Wear a medical ID bracelet or chain, and carry a card that describes your disease and details of your medicine and dosage times. What side effects may I notice from receiving this medicine? Side effects that you should report to your doctor or health care professional as soon as possible: -allergic reactions like skin rash, itching or hives, swelling of the face, lips, or tongue -breathing problems -signs and symptoms of low blood sugar such as feeling anxious, confusion, dizziness, increased  hunger, unusually weak or tired, sweating, shakiness, cold, irritable, headache, blurred vision, fast heartbeat, loss of consciousness -swelling of the ankles, feet, hands -trouble passing urine or change in the amount of urine -unusual stomach pain or upset -unusually weak or tired -vomiting Side effects that usually do not require medical attention (report to your doctor or health care professional if they continue or are bothersome): -constipation -diarrhea -dizziness -headache -nausea -upset stomach This list may not describe all possible side effects. Call your doctor for medical advice about side effects. You may report side effects to FDA at 1-800-FDA-1088. Where should I keep my medicine? Keep out of the reach of children. Store unopened pen in a refrigerator between 2 and 8 degrees C (36 and 46 degrees F). Do not freeze or use if the medicine has been frozen. Protect from light and excessive heat. After you first use the pen, it should be kept at a temperature not to exceed 25 degrees C (77 degrees F). Throw away your used pen after 30 days or after the expiration date, whichever comes first. Do not store your pen with the needle attached. If the needle is left on, medicine may leak from the pen or air bubbles may form in the cartridge. NOTE: This sheet is a summary. It may not cover all possible information. If you have questions about this medicine, talk to your doctor, pharmacist, or health care provider.  2018 Elsevier/Gold Standard (2016-06-24 16:55:13)

## 2016-10-12 ENCOUNTER — Encounter: Payer: Self-pay | Admitting: Family Medicine

## 2016-10-12 ENCOUNTER — Ambulatory Visit (INDEPENDENT_AMBULATORY_CARE_PROVIDER_SITE_OTHER): Payer: Self-pay | Admitting: Orthopedic Surgery

## 2016-10-12 LAB — SEDIMENTATION RATE: Sed Rate: 11 mm/hr (ref 0–20)

## 2016-10-12 LAB — C-REACTIVE PROTEIN: CRP: 30.3 mg/L — ABNORMAL HIGH (ref <8.0)

## 2016-10-12 LAB — ANA: Anti Nuclear Antibody(ANA): NEGATIVE

## 2016-10-12 LAB — VITAMIN D 25 HYDROXY (VIT D DEFICIENCY, FRACTURES): Vit D, 25-Hydroxy: 10 ng/mL — ABNORMAL LOW (ref 30–100)

## 2016-10-12 LAB — RHEUMATOID FACTOR: Rhuematoid fact SerPl-aCnc: <14 IU/mL (ref <14)

## 2016-10-14 ENCOUNTER — Other Ambulatory Visit: Payer: Self-pay | Admitting: Family Medicine

## 2016-10-14 DIAGNOSIS — E559 Vitamin D deficiency, unspecified: Secondary | ICD-10-CM

## 2016-10-14 MED ORDER — ERGOCALCIFEROL 1.25 MG (50000 UT) PO CAPS: 50000.0000 [IU] | ORAL_CAPSULE | ORAL | 5 refills | Status: DC

## 2016-10-24 ENCOUNTER — Other Ambulatory Visit: Payer: Self-pay | Admitting: Family Medicine

## 2016-10-24 ENCOUNTER — Telehealth: Payer: Self-pay

## 2016-10-24 NOTE — Progress Notes (Unsigned)
Please call pharmacy to obtain clarity as to which type of needles that I need to order for Byetta pen.

## 2016-10-24 NOTE — Progress Notes (Signed)
Called in a verbal for the diabetes shot needles

## 2016-11-04 ENCOUNTER — Ambulatory Visit: Payer: Medicare Other | Admitting: Family Medicine

## 2016-12-28 ENCOUNTER — Encounter (HOSPITAL_COMMUNITY): Payer: Self-pay | Admitting: Family Medicine

## 2016-12-28 ENCOUNTER — Ambulatory Visit (HOSPITAL_COMMUNITY)
Admission: EM | Admit: 2016-12-28 | Discharge: 2016-12-28 | Disposition: A | Discharge status: Home / Self Care | Payer: Medicare Other | Attending: Internal Medicine | Admitting: Internal Medicine

## 2016-12-28 DIAGNOSIS — F17200 Nicotine dependence, unspecified, uncomplicated: Secondary | ICD-10-CM

## 2016-12-28 DIAGNOSIS — J9801 Acute bronchospasm: Secondary | ICD-10-CM

## 2016-12-28 DIAGNOSIS — B9789 Other viral agents as the cause of diseases classified elsewhere: Secondary | ICD-10-CM | POA: Diagnosis not present

## 2016-12-28 DIAGNOSIS — J069 Acute upper respiratory infection, unspecified: Secondary | ICD-10-CM

## 2016-12-28 DIAGNOSIS — R05 Cough: Secondary | ICD-10-CM

## 2016-12-28 MED ORDER — IPRATROPIUM BROMIDE 0.06 % NA SOLN: 2.0000 | Freq: Four times a day (QID) | NASAL | 0 refills | Status: DC

## 2016-12-28 MED ORDER — IPRATROPIUM-ALBUTEROL 0.5-2.5 (3) MG/3ML IN SOLN
RESPIRATORY_TRACT | Status: AC
  Filled 2016-12-28: qty 3

## 2016-12-28 MED ORDER — ALBUTEROL SULFATE HFA 108 (90 BASE) MCG/ACT IN AERS: 2.0000 | INHALATION_SPRAY | RESPIRATORY_TRACT | 0 refills | Status: DC | PRN

## 2016-12-28 MED ORDER — IPRATROPIUM-ALBUTEROL 0.5-2.5 (3) MG/3ML IN SOLN
3.0000 mL | Freq: Once | RESPIRATORY_TRACT | Status: AC
  Administered 2016-12-28: 3 mL via RESPIRATORY_TRACT

## 2016-12-28 NOTE — Discharge Instructions (Signed)
Your symptoms are consistent with either bad allergies and/or a tight provide [place] was similar to a cold. One thing is making your symptoms worse his wheezing which is in part caused by your smoking. Use the albuterol inhaler 2 puffs every 4 hours to help with cough and wheeze. Also use the Atrovent nasal spray as directed to help with runny nose. The following  medictations can be used for specific symptoms. Sudafed PE 10 mg every  6 hours as needed for congestion Allegra or Zyrtec daily as needed for drainage and runny nose. For stronger antihistamine may take Chlor-Trimeton 2 to 4 mg every 4 to 6 hours, may cause drowsiness. Saline nasal spray used frequently. Drink plenty of fluids and stay well-hydrated. Flonase or Rhinocort nasal spray daily

## 2016-12-28 NOTE — ED Triage Notes (Signed)
Pt here for cough, congestion, left ear pain.

## 2016-12-28 NOTE — ED Provider Notes (Signed)
23-yer-old female complaining of a five-day historyCSN: [id number]     Arrival date & time 12/28/16  1134 History   First MD Initiated Contact with Patient 12/28/16 1255     Chief Complaint  Patient presents with  . Cough   (Consider location/radiation/quality/duration/timing/severity/associated sxs/prior Treatment)  51 -year-old female  Sore throat, stuffy head, sneezing, PND, cough, L ear pressure x 5 d      Past Medical History:  Diagnosis Date  . Abnormal Pap smear of cervix   . Adrenal nodule ([place])   . Anemia   . Anxiety   . Arthritis    osteoarthritis  . Chronic gum disease   . Depression   . Diabetes mellitus without complication ([place])   . Fibromyalgia   . Hyperlipidemia   . Hypertension    on no meds   . Shortness of breath    related to fibromyalgia  . UTI (lower urinary tract infection)    Past Surgical History:  Procedure Laterality Date  . ADENOIDECTOMY    . DENTAL SURGERY    . LUMBAR LAMINECTOMY/DECOMPRESSION MICRODISCECTOMY  07/25/2012   Procedure: LUMBAR LAMINECTOMY/DECOMPRESSION MICRODISCECTOMY 1 LEVEL;  Surgeon: Johnn Hai, MD;  Location: [facility];  Service: Orthopedics;  Laterality: N/A;  MICRODISCECTOMY L5-S1, FORAMINOTOMY L5-S1  . TUBAL LIGATION    . TYMPANOPLASTY     Family History  Problem Relation Age of Onset  . Cancer Paternal Aunt        cervical  . Cancer Maternal Grandmother        breast  . Cancer Paternal Grandfather        lung   Social History  Substance Use Topics  . Smoking status: Current Every Day Smoker    Packs/day: 0.50    Years: 13.00    Types: Cigarettes  . Smokeless tobacco: Never Used  . Alcohol use No     Comment: occassionally beer   OB History    Gravida Para Term Preterm AB Living   3 2 2   1 2    SAB TAB Ectopic Multiple Live Births     1           Review of Systems  Constitutional: Positive for activity change and fatigue. Negative for appetite change, chills and fever.  HENT: Positive for  congestion, postnasal drip, rhinorrhea, sneezing and sore throat. Negative for facial swelling.   Eyes: Negative.   Respiratory: Positive for cough and shortness of breath.   Cardiovascular: Negative.   Musculoskeletal: Negative for neck pain and neck stiffness.  Skin: Negative for pallor and rash.  Neurological: Negative.   All other systems reviewed and are negative.   Allergies  Neomycin  Home Medications   Prior to Admission medications   Medication Sig Start Date End Date Taking? Authorizing Provider  albuterol (PROVENTIL HFA;VENTOLIN HFA) 108 (90 Base) MCG/ACT inhaler Inhale 2 puffs into the lungs every 4 (four) hours as needed for wheezing or shortness of breath. 12/28/16   Janne Napoleon, NP  cyclobenzaprine (FLEXERIL) 10 MG tablet Take 1 tablet (10 mg total) by mouth 3 (three) times daily as needed for muscle spasms. Patient not taking: Reported on 09/08/2016 06/27/13   Ignacia Felling, PA-C  diclofenac (VOLTAREN) 75 MG EC tablet Take 75 mg by mouth 2 (two) times daily.    [provider]  DULoxetine (CYMBALTA) 30 MG capsule Take 1 capsule (30 mg total) by mouth daily before breakfast. 09/08/16   Dorena Dew, FNP  ergocalciferol (DRISDOL) 50000  units capsule Take 1 capsule (50,000 Units total) by mouth once a week. 10/14/16   Dorena Dew, FNP  exenatide (BYETTA) 5 MCG/0.02ML SOPN injection Inject 0.02 mLs (5 mcg total) into the skin 2 (two) times daily with a meal. 10/11/16   Dorena Dew, FNP  gabapentin (NEURONTIN) 300 MG capsule Take 1 capsule (300 mg total) by mouth 3 (three) times daily. 10/11/16   Dorena Dew, FNP  gemfibrozil (LOPID) 600 MG tablet Take 600 mg by mouth 2 (two) times daily before a meal.    [provider]  glucose blood test strip Check fasting blood sugar in am and 2 hours after breakfast 09/08/16   Dorena Dew, FNP  ipratropium (ATROVENT) 0.06 % nasal spray Place 2 sprays into both nostrils 4 (four) times daily. 12/28/16    Janne Napoleon, NP  Multiple Vitamin (MULTIVITAMIN WITH MINERALS) TABS Take 1 tablet by mouth daily.    [provider]  Omega-3 Fatty Acids (FISH OIL) 1200 MG CAPS Take 1 capsule by mouth daily.    [provider]  ranitidine (ZANTAC) 150 MG capsule Take 150 mg by mouth at bedtime.     [provider]  sitaGLIPtin-metformin (JANUMET) 50-1000 MG tablet Take 1 tablet by mouth 2 (two) times daily with a meal. 09/08/16   Dorena Dew, FNP  sulfamethoxazole-trimethoprim (BACTRIM DS,SEPTRA DS) 800-160 MG tablet Take 1 tablet by mouth 2 (two) times daily. Patient not taking: Reported on 10/11/2016 09/08/16   Dorena Dew, FNP  vitamin C (ASCORBIC ACID) 500 MG tablet Take 500 mg by mouth daily.    [provider]   Meds Ordered and Administered this Visit   Medications  ipratropium-albuterol (DUONEB) 0.5-2.5 (3) MG/3ML nebulizer solution 3 mL (3 mLs Nebulization Given 12/28/16 1322)    BP 129/84   Pulse (!) 105   Temp 98.7 F (37.1 C) (Oral)   Resp (!) 24   SpO2 100%  No data found.   Physical Exam  Constitutional: She is oriented to person, place, and time. She appears well-developed and well-nourished. No distress.  HENT:   Left TM retracted and mildly erytmatous  oropharynx clear PND and minor erythema  Eyes: EOM are normal.  Neck: Normal range of motion. Neck supple.  Cardiovascular: Normal rate and regular rhythm.   Pulmonary/Chest: Effort normal. No respiratory distress. She has wheezes.   Prolonged expiratory phase  Biteral coarseness with cough  Musculoskeletal: Normal range of motion. She exhibits no edema.  Lymphadenopathy:    She has no cervical adenopathy.  Neurological: She is alert and oriented to person, place, and time.  Skin: Skin is warm and dry. No rash noted.  Psychiatric: She has a normal mood and affect.  Nursing note and vitals reviewed.   Urgent Care Course     Procedures (including critical care time)  Labs  Review Labs Reviewed - No data to display  Imaging Review No results found.   Visual Acuity Review  Right Eye Distance:   Left Eye Distance:   Bilateral Distance:    Right Eye Near:   Left Eye Near:    Bilateral Near:         MDM   1. Viral URI with cough   2. Bronchospasm   3. Tobacco use disorder     Post DuoNeb atient stats she is breathing better. She is moving air faster and deeper. Decrease  Coarseness. Your symptoms are consistent with either bad allergies and/or a tight provide  [place] was similar to a cold. One thing is making your symptoms worse his wheezing which is in part caused by your smoking. Use the albuterol inhaler 2 puffs every 4 hours to help with cough and wheeze. Also use the Atrovent nasal spray as directed to help with runny nose. The following  medictations can be used for specific symptoms. Sudafed PE 10 mg every  6 hours as needed for congestion Allegra or Zyrtec daily as needed for drainage and runny nose. For stronger antihistamine may take Chlor-Trimeton 2 to 4 mg every 4 to 6 hours, may cause drowsiness. Saline nasal spray used frequently. Drink plenty of fluids and stay well-hydrated. Flonase or Rhinocort nasal spray daily Meds ordered this encounter  Medications  . ipratropium-albuterol (DUONEB) 0.5-2.5 (3) MG/3ML nebulizer solution 3 mL  . ipratropium (ATROVENT) 0.06 % nasal spray    Sig: Place 2 sprays into both nostrils 4 (four) times daily.    Dispense:  15 mL    Refill:  0    Order Specific Question:   Supervising Provider    Answer:   Sherlene Shams [[id number]]  . albuterol (PROVENTIL HFA;VENTOLIN HFA) 108 (90 Base) MCG/ACT inhaler    Sig: Inhale 2 puffs into the lungs every 4 (four) hours as needed for wheezing or shortness of breath.    Dispense:  1 Inhaler    Refill:  0    Order Specific Question:   Supervising Provider    Answer:   Sherlene Shams [[id number]]        Janne Napoleon, NP 12/28/16 1354

## 2017-01-10 ENCOUNTER — Ambulatory Visit: Payer: Medicare Other | Admitting: Family Medicine

## 2017-01-16 ENCOUNTER — Encounter: Payer: Self-pay | Admitting: Family Medicine

## 2017-01-16 ENCOUNTER — Ambulatory Visit (INDEPENDENT_AMBULATORY_CARE_PROVIDER_SITE_OTHER): Payer: Medicare Other | Admitting: Family Medicine

## 2017-01-16 ENCOUNTER — Ambulatory Visit (HOSPITAL_COMMUNITY)
Admission: RE | Admit: 2017-01-16 | Discharge: 2017-01-16 | Disposition: A | Discharge status: Home / Self Care | Payer: Medicare Other | Source: Ambulatory Visit | Attending: Family Medicine | Admitting: Family Medicine

## 2017-01-16 VITALS — BP 133/62 | HR 94 | Temp 98.2°F | Resp 18 | Ht 64.0 in | Wt 172.0 lb

## 2017-01-16 DIAGNOSIS — F17200 Nicotine dependence, unspecified, uncomplicated: Secondary | ICD-10-CM | POA: Diagnosis not present

## 2017-01-16 DIAGNOSIS — E7800 Pure hypercholesterolemia, unspecified: Secondary | ICD-10-CM | POA: Diagnosis not present

## 2017-01-16 DIAGNOSIS — R0609 Other forms of dyspnea: Secondary | ICD-10-CM | POA: Insufficient documentation

## 2017-01-16 DIAGNOSIS — R0600 Dyspnea, unspecified: Secondary | ICD-10-CM

## 2017-01-16 DIAGNOSIS — R0602 Shortness of breath: Secondary | ICD-10-CM | POA: Diagnosis not present

## 2017-01-16 DIAGNOSIS — R7309 Other abnormal glucose: Secondary | ICD-10-CM

## 2017-01-16 DIAGNOSIS — E119 Type 2 diabetes mellitus without complications: Secondary | ICD-10-CM | POA: Diagnosis not present

## 2017-01-16 DIAGNOSIS — R05 Cough: Secondary | ICD-10-CM | POA: Diagnosis not present

## 2017-01-16 LAB — COMPLETE METABOLIC PANEL WITH GFR
ALT: 10 U/L (ref 6–29)
AST: 10 U/L (ref 10–35)
Albumin: 4.3 g/dL (ref 3.6–5.1)
Alkaline Phosphatase: 74 U/L (ref 33–115)
BUN: 10 mg/dL (ref 7–25)
CO2: 21 mmol/L (ref 20–31)
Calcium: 9.5 mg/dL (ref 8.6–10.2)
Chloride: 102 mmol/L (ref 98–110)
Creat: 0.92 mg/dL (ref 0.50–1.10)
GFR, Est African American: 85 mL/min (ref >=60)
GFR, Est Non African American: 73 mL/min (ref >=60)
Glucose, Bld: 199 mg/dL — ABNORMAL HIGH (ref 65–99)
Potassium: 4 mmol/L (ref 3.5–5.3)
Sodium: 137 mmol/L (ref 135–146)
Total Bilirubin: 0.3 mg/dL (ref 0.2–1.2)
Total Protein: 6.8 g/dL (ref 6.1–8.1)

## 2017-01-16 LAB — POCT GLYCOSYLATED HEMOGLOBIN (HGB A1C): Hemoglobin A1C: 8.6

## 2017-01-16 LAB — POCT URINALYSIS DIP (DEVICE)
Glucose, UA: NEGATIVE mg/dL
Hgb urine dipstick: NEGATIVE
Ketones, ur: NEGATIVE mg/dL
Leukocytes, UA: NEGATIVE
Nitrite: NEGATIVE
Protein, ur: 100 mg/dL — AB
Specific Gravity, Urine: >=1.03 (ref 1.005–1.030)
Urobilinogen, UA: 1 mg/dL (ref 0.0–1.0)
pH: 5.5 (ref 5.0–8.0)

## 2017-01-16 LAB — GLUCOSE, CAPILLARY: Glucose-Capillary: 238 mg/dL — ABNORMAL HIGH (ref 65–99)

## 2017-01-16 MED ORDER — AZITHROMYCIN 250 MG PO TABS: ORAL_TABLET | ORAL | 0 refills | Status: DC

## 2017-01-16 MED ORDER — BUPROPION HCL ER (SR) 150 MG PO TB12: 150.0000 mg | ORAL_TABLET | Freq: Two times a day (BID) | ORAL | 5 refills | Status: DC

## 2017-01-16 MED ORDER — GEMFIBROZIL 600 MG PO TABS: 600.0000 mg | ORAL_TABLET | Freq: Two times a day (BID) | ORAL | 5 refills | Status: DC

## 2017-01-16 MED ORDER — SITAGLIPTIN PHOS-METFORMIN HCL 50-1000 MG PO TABS: 1.0000 | ORAL_TABLET | Freq: Two times a day (BID) | ORAL | 5 refills | Status: DC

## 2017-01-16 NOTE — Patient Instructions (Signed)
Diabetes and Foot Care Diabetes may cause you to have problems because of poor blood supply (circulation) to your feet and legs. This may cause the skin on your feet to become thinner, break easier, and heal more slowly. Your skin may become dry, and the skin may peel and crack. You may also have nerve damage in your legs and feet causing decreased feeling in them. You may not notice minor injuries to your feet that could lead to infections or more serious problems. Taking care of your feet is one of the most important things you can do for yourself. Follow these instructions at home:  Wear shoes at all times, even in the house. Do not go barefoot. Bare feet are easily injured.  Check your feet daily for blisters, cuts, and redness. If you cannot see the bottom of your feet, use a mirror or ask someone for help.  Wash your feet with warm water (do not use hot water) and mild soap. Then pat your feet and the areas between your toes until they are completely dry. Do not soak your feet as this can dry your skin.  Apply a moisturizing lotion or petroleum jelly (that does not contain alcohol and is unscented) to the skin on your feet and to dry, brittle toenails. Do not apply lotion between your toes.  Trim your toenails straight across. Do not dig under them or around the cuticle. File the edges of your nails with an emery board or nail file.  Do not cut corns or calluses or try to remove them with medicine.  Wear clean socks or stockings every day. Make sure they are not too tight. Do not wear knee-high stockings since they may decrease blood flow to your legs.  Wear shoes that fit properly and have enough cushioning. To break in new shoes, wear them for just a few hours a day. This prevents you from injuring your feet. Always look in your shoes before you put them on to be sure there are no objects inside.  Do not cross your legs. This may decrease the blood flow to your feet.  If you find a  minor scrape, cut, or break in the skin on your feet, keep it and the skin around it clean and dry. These areas may be cleansed with mild soap and water. Do not cleanse the area with peroxide, alcohol, or iodine.  When you remove an adhesive bandage, be sure not to damage the skin around it.  If you have a wound, look at it several times a day to make sure it is healing.  Do not use heating pads or hot water bottles. They may burn your skin. If you have lost feeling in your feet or legs, you may not know it is happening until it is too late.  Make sure your health care provider performs a complete foot exam at least annually or more often if you have foot problems. Report any cuts, sores, or bruises to your health care provider immediately. Contact a health care provider if:  You have an injury that is not healing.  You have cuts or breaks in the skin.  You have an ingrown nail.  You notice redness on your legs or feet.  You feel burning or tingling in your legs or feet.  You have pain or cramps in your legs and feet.  Your legs or feet are numb.  Your feet always feel cold. Get help right away if:  There is increasing   redness, swelling, or pain in or around a wound.  There is a red line that goes up your leg.  Pus is coming from a wound.  You develop a fever or as directed by your health care provider.  You notice a bad smell coming from an ulcer or wound. This information is not intended to replace advice given to you by your health care provider. Make sure you discuss any questions you have with your health care provider. Document Released: 06/03/2000 Document Revised: 11/12/2015 Document Reviewed: 11/13/2012 Elsevier Interactive Patient Education  2017 [place] for Diabetes Mellitus, Adult Carbohydrate counting is a method for keeping track of how many carbohydrates you eat. Eating carbohydrates naturally increases the amount of sugar (glucose)  in the blood. Counting how many carbohydrates you eat helps keep your blood glucose within normal limits, which helps you manage your diabetes (diabetes mellitus). It is important to know how many carbohydrates you can safely have in each meal. This is different for every person. A diet and nutrition specialist (registered dietitian) can help you make a meal plan and calculate how many carbohydrates you should have at each meal and snack. Carbohydrates are found in the following foods:  Grains, such as breads and cereals.  Dried beans and soy products.  Starchy vegetables, such as potatoes, peas, and corn.  Fruit and fruit juices.  Milk and yogurt.  Sweets and snack foods, such as cake, cookies, candy, chips, and soft drinks.  How do I count carbohydrates? There are two ways to count carbohydrates in food. You can use either of the methods or a combination of both. Reading "Nutrition Facts" on packaged food The "Nutrition Facts" list is included on the labels of almost all packaged foods and beverages in the [place] It includes:  The serving size.  Information about nutrients in each serving, including the grams (g) of carbohydrate per serving.  To use the "Nutrition Facts":  Decide how many servings you will have.  Multiply the number of servings by the number of carbohydrates per serving.  The resulting number is the total amount of carbohydrates that you will be having.  Learning standard serving sizes of other foods When you eat foods containing carbohydrates that are not packaged or do not include "Nutrition Facts" on the label, you need to measure the servings in order to count the amount of carbohydrates:  Measure the foods that you will eat with a food scale or measuring cup, if needed.  Decide how many standard-size servings you will eat.  Multiply the number of servings by 15. Most carbohydrate-rich foods have about 15 g of carbohydrates per serving. ? For example, if  you eat 8 oz (170 g) of strawberries, you will have eaten 2 servings and 30 g of carbohydrates (2 servings x 15 g = 30 g).  For foods that have more than one food mixed, such as soups and casseroles, you must count the carbohydrates in each food that is included.  The following list contains standard serving sizes of common carbohydrate-rich foods. Each of these servings has about 15 g of carbohydrates:   hamburger bun or  English muffin.   oz (15 mL) syrup.   oz (14 g) jelly.  1 slice of bread.  1 six-inch tortilla.  3 oz (85 g) cooked rice or pasta.  4 oz (113 g) cooked dried beans.  4 oz (113 g) starchy vegetable, such as peas, corn, or potatoes.  4 oz (113 g) hot cereal.  4 oz (113 g) mashed potatoes or  of a large baked potato.  4 oz (113 g) canned or frozen fruit.  4 oz (120 mL) fruit juice.  4-6 crackers.  6 chicken nuggets.  6 oz (170 g) unsweetened dry cereal.  6 oz (170 g) plain fat-free yogurt or yogurt sweetened with artificial sweeteners.  8 oz (240 mL) milk.  8 oz (170 g) fresh fruit or one small piece of fruit.  24 oz (680 g) popped popcorn.  Example of carbohydrate counting Sample meal  3 oz (85 g) chicken breast.  6 oz (170 g) brown rice.  4 oz (113 g) corn.  8 oz (240 mL) milk.  8 oz (170 g) strawberries with sugar-free whipped topping. Carbohydrate calculation 1. Identify the foods that contain carbohydrates: ? Rice. ? Corn. ? Milk. ? Strawberries. 2. Calculate how many servings you have of each food: ? 2 servings rice. ? 1 serving corn. ? 1 serving milk. ? 1 serving strawberries. 3. Multiply each number of servings by 15 g: ? 2 servings rice x 15 g = 30 g. ? 1 serving corn x 15 g = 15 g. ? 1 serving milk x 15 g = 15 g. ? 1 serving strawberries x 15 g = 15 g. 4. Add together all of the amounts to find the total grams of carbohydrates eaten: ? 30 g + 15 g + 15 g + 15 g = 75 g of carbohydrates total. This information is  not intended to replace advice given to you by your health care provider. Make sure you discuss any questions you have with your health care provider. Document Released: 06/06/2005 Document Revised: 12/25/2015 Document Reviewed: 11/18/2015 Elsevier Interactive Patient Education  2018 [employer]  

## 2017-01-16 NOTE — Progress Notes (Signed)
Subjective:    Patient ID: Cindy Baker, female    DOB: [birth year], 51 y.o.   MRN: [id number]  [name], a 51 year old female presents for a follow up of diabetes mellitus and hyperlipidemia. Previous hemoglobin a1C was 8.6. Patient was started on Byetta several months ago. She says that she has not been taking Janumet since starting Byetta. She has not been following a low fat, low carbohydrate diet over the past month. She is also not checking blood sugar consistently.     Diabetes  She presents for her follow-up diabetic visit. She has type 2 diabetes mellitus. No MedicAlert identification noted. Her disease course has been stable. There are no hypoglycemic associated symptoms. Pertinent negatives for hypoglycemia include no confusion, dizziness, headaches, mood changes, nervousness/anxiousness, pallor, seizures or speech difficulty. Associated symptoms include foot paresthesias and weakness. Pertinent negatives for diabetes include no blurred vision, no chest pain, no fatigue, no polydipsia, no polyphagia and no polyuria. Symptoms are stable. Diabetic complications include nephropathy. Pertinent negatives for diabetic complications include no autonomic neuropathy, CVA, heart disease, PVD or retinopathy. Current diabetic treatment includes oral agent (dual therapy). She has not had a previous visit with a dietitian. She never participates in exercise. There is no change in her home blood glucose trend. An ACE inhibitor/angiotensin II receptor blocker is being taken. She does not see a podiatrist.Eye exam is not current.  Foot Pain  Associated symptoms include arthralgias, joint swelling, myalgias, numbness and weakness. Pertinent negatives include no abdominal pain, chest pain, fatigue, fever or headaches. Associated symptoms comments: Bilateral great toe pain. She has tried NSAIDs for the symptoms. The treatment provided no relief.  Shortness of Breath  This is a new problem. The  current episode started 1 to 4 weeks ago. The problem occurs constantly. Associated symptoms include sputum production and wheezing. Pertinent negatives include no abdominal pain, chest pain, ear pain, fever, headaches or leg swelling. Risk factors include smoking. There is no history of COPD.   Past Medical History:  Diagnosis Date  . Abnormal Pap smear of cervix   . Adrenal nodule ([place])   . Anemia   . Anxiety   . Arthritis    osteoarthritis  . Chronic gum disease   . Depression   . Diabetes mellitus without complication ([place])   . Fibromyalgia   . Hyperlipidemia   . Hypertension    on no meds   . Shortness of breath    related to fibromyalgia  . UTI (lower urinary tract infection)    Immunization History  Administered Date(s) Administered  . Influenza Split 06/30/2011  . Influenza Whole 04/29/2009, 06/08/2010  . Pneumococcal Polysaccharide-23 09/08/2016  . Td 11/18/2000  . Tdap 09/08/2016   Allergies  Allergen Reactions  . Neomycin Other (See Comments)    Eyes swelling   Review of Systems  Constitutional: Negative for fatigue and fever.  HENT: Negative.  Negative for ear pain.   Eyes: Negative.  Negative for blurred vision.  Respiratory: Positive for sputum production, shortness of breath and wheezing.   Cardiovascular: Negative.  Negative for chest pain, palpitations and leg swelling.  Gastrointestinal: Negative.  Negative for abdominal pain.  Endocrine: Negative.  Negative for cold intolerance, heat intolerance, polydipsia, polyphagia and polyuria.  Genitourinary: Positive for flank pain. Negative for hematuria.  Musculoskeletal: Positive for arthralgias, joint swelling and myalgias.  Skin: Negative.  Negative for pallor.  Allergic/Immunologic: Negative for immunocompromised state.  Neurological: Positive for weakness and numbness. Negative for dizziness,  seizures, facial asymmetry, speech difficulty, light-headedness and headaches.  Hematological: Negative.    Psychiatric/Behavioral: Negative.  Negative for confusion. The patient is not nervous/anxious.        Depression      Objective:   Physical Exam  Constitutional: She is oriented to person, place, and time. She appears well-developed and well-nourished.  HENT:  Head: Normocephalic and atraumatic.  Right Ear: External ear normal.  Left Ear: External ear normal.  Nose: Nose normal.  Mouth/Throat: Oropharynx is clear and moist.  Eyes: Pupils are equal, round, and reactive to light. Conjunctivae and EOM are normal.  Neck: Normal range of motion. Neck supple.  Pulmonary/Chest: Effort normal and breath sounds normal.  Abdominal: Soft. Bowel sounds are normal.  Musculoskeletal: She exhibits no edema or deformity.       Right shoulder: She exhibits decreased range of motion and tenderness.       Left shoulder: She exhibits decreased range of motion and tenderness.       Right knee: She exhibits decreased range of motion.       Lumbar back: She exhibits decreased range of motion, pain and spasm.  Neurological: She is alert and oriented to person, place, and time. She has normal reflexes.  Skin: Skin is warm and dry.  Psychiatric: She has a normal mood and affect. Her behavior is normal. Judgment and thought content normal.      BP 133/62 (BP Location: Left Arm, Patient Position: Sitting, Cuff Size: Normal)   Pulse 94   Temp 98.2 F (36.8 C) (Oral)   Resp 18   Ht 5\' 4"  (1.626 m)   Wt 172 lb (78 kg)   LMP  (LMP Unknown)   SpO2 100%   BMI 29.52 kg/m  Assessment & Plan:  1. Type 2 diabetes mellitus with complication, unspecified whether long term insulin use (HCC) Hemoglobin a1C is 8.6, will continue Janumet and add Byetta twice daily with meals. Will follow up in 1 month.  - Glucose, capillary - HgB A1c - POCT urinalysis dip (device) - sitaGLIPtin-metformin (JANUMET) 50-1000 MG tablet; Take 1 tablet by mouth 2 (two) times daily with a meal.  Dispense: 60 tablet; Refill: 5 -  azithromycin (ZITHROMAX) 250 MG tablet; Take 500 mg on day one. Days 2-5 take 250 mg  Dispense: 6 tablet; Refill: 0 - COMPLETE METABOLIC PANEL WITH GFR  2. HYPERGLYCEMIA - HgB A1c  3. HYPERCHOLESTEROLEMIA The patient is asked to make an attempt to improve diet and exercise patterns to aid in medical management of this problem. - gemfibrozil (LOPID) 600 MG tablet; Take 1 tablet (600 mg total) by mouth 2 (two) times daily before a meal.  Dispense: 60 tablet; Refill: 5  4. Dyspnea on exertion - DG Chest 2 View; Future  5. Tobacco dependence - buPROPion (WELLBUTRIN SR) 150 MG 12 hr tablet; Take 1 tablet (150 mg total) by mouth 2 (two) times daily. Take 150 mg for 3 days. Increase to 150 mg twice daily  Dispense: 60 tablet; Refill: 5   RTC: 1 month for DMII   Donia Pounds  MSN, FNP-C [facility] [address] [place], [place] [address] [phone]

## 2017-01-17 ENCOUNTER — Telehealth: Payer: Self-pay

## 2017-01-17 NOTE — Telephone Encounter (Signed)
Called, no answer. Left message for patient to call back. Thanks!  

## 2017-01-17 NOTE — Telephone Encounter (Signed)
-----   Message from Dorena Dew, [place] sent at 01/16/2017  4:38 PM EDT ----- Regarding: xray results Please inform Ms. Lovin that chest xray was unremarkable, no acute pulmonary process. Start antibiotic therapy. Albuterol inhaler 2 puffs every 6 hours as needed for shortness of breath, wheezing or persistent cough. Decrease smoking as discussed during appointment.   Thanks  ----- Message ----- From: Interface, Rad Results In Sent: 01/16/2017  12:24 PM To: Dorena Dew, FNP

## 2017-01-18 NOTE — Telephone Encounter (Signed)
Called, no answer. Left message to call back. Thanks!  

## 2017-01-18 NOTE — Telephone Encounter (Signed)
Patient called back, I advised of unremarkable xray and to continue antibiotic as prescribed. Advised that she can use albuterol inhaler 2 puffs every 6 hours as needed for shortness of breath. Patient verbalized understanding. Thanks!

## 2017-02-01 ENCOUNTER — Encounter: Payer: Self-pay | Admitting: Family Medicine

## 2017-02-01 DIAGNOSIS — E119 Type 2 diabetes mellitus without complications: Secondary | ICD-10-CM | POA: Diagnosis not present

## 2017-02-01 DIAGNOSIS — H0264 Xanthelasma of left upper eyelid: Secondary | ICD-10-CM | POA: Diagnosis not present

## 2017-02-01 DIAGNOSIS — H2513 Age-related nuclear cataract, bilateral: Secondary | ICD-10-CM | POA: Diagnosis not present

## 2017-02-01 DIAGNOSIS — H0261 Xanthelasma of right upper eyelid: Secondary | ICD-10-CM | POA: Diagnosis not present

## 2017-02-08 ENCOUNTER — Ambulatory Visit (INDEPENDENT_AMBULATORY_CARE_PROVIDER_SITE_OTHER): Payer: Medicare Other | Admitting: Orthopedic Surgery

## 2017-02-08 ENCOUNTER — Encounter (INDEPENDENT_AMBULATORY_CARE_PROVIDER_SITE_OTHER): Payer: Self-pay | Admitting: Orthopedic Surgery

## 2017-02-08 DIAGNOSIS — M79671 Pain in right foot: Secondary | ICD-10-CM | POA: Diagnosis not present

## 2017-02-08 DIAGNOSIS — M79672 Pain in left foot: Secondary | ICD-10-CM | POA: Diagnosis not present

## 2017-02-08 MED ORDER — DICLOFENAC SODIUM 1 % TD GEL: 2.0000 g | Freq: Two times a day (BID) | TRANSDERMAL | 1 refills | Status: DC

## 2017-02-08 NOTE — Progress Notes (Signed)
Office Visit Note   Patient: Cindy Baker           Date of Birth: 10-Jul-1965           MRN: [id number] Visit Date: 02/08/2017 Requested by: Dorena Dew, FNP [address], [place] [address] PCP: Dorena Dew, FNP  Subjective: Chief Complaint  Patient presents with  . MULTI JOINT PAIN    HPI: Cindy Baker is a 51 year old patient with multiple joint complaints.  Notably she reports bilateral foot pain.  She has diabetes with hemoglobin A1c 8.6.  She also has a history of back surgery.  Importantly her vitamin D level recently was at a level of 6.  She's had a month of supplementation for that but does report multiple joint complaints.  She has radiographs from February 2018 which are reviewed which does show arthritis in the MTP joint left foot.  She has used full tear and gel in the past which has helped.  She also has a history of fibromyalgia.  Blood work is reviewed and the patient does not have elevated uric acid or rheumatoid factor.              ROS: All systems reviewed are negative as they relate to the chief complaint within the history of present illness.  Patient denies  fevers or chills.   Assessment & Plan: Visit Diagnoses:  1. Pain in both feet     Plan: Impression is multiple joint complaints which most likely is related to her vitamin D level.  I would recommend repeat clinical evaluation in 2-3 months if she remains symptomatic in any of these areas.  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  She has better pulses in the left foot than the right.  She is a smoker and has diabetes 70 intervention should be approached with caution.  I think she could have either fusion or cheilectomy done on the left that she wants to hold off on that intervention for now.  I think it's pretty reasonable.  She does have reasonable shoes as well which are relatively stiff in the distal portion to prevent  unnecessary toe dorsiflexion with walking.  I'll see her back as needed.  I did refill her Voltaren gel which may or may not be approved by insurance.  Follow-Up Instructions: Return if symptoms worsen or fail to improve.   Orders:  No orders of the defined types were placed in this encounter.  Meds ordered this encounter  Medications  . diclofenac sodium (VOLTAREN) 1 % GEL    Sig: Apply 2 g topically 2 (two) times daily.    Dispense:  1 Tube    Refill:  1      Procedures: No procedures performed   Clinical Data: No additional findings.  Objective: Vital Signs: LMP  (LMP Unknown)   Physical Exam:   Constitutional: Patient appears well-developed HEENT:  Head: Normocephalic Eyes:EOM are normal Neck: Normal range of motion Cardiovascular: Normal rate Pulmonary/chest: Effort normal Neurologic: Patient is alert Skin: Skin is warm Psychiatric: Patient has normal mood and affect    Ortho Exam: Orthopedic exam demonstrates no lower extremity nerve root tension signs.  She has palpable pedal pulses bilaterally stronger on the left than the right.  Ankle dorsiflexion plantar flexion is intact.  Patient has good inversion and eversion strength.  She has tenderness to palpation at the MTP joint left  and right first MTP joints.  Range of motion diminished in these toes.  She has some areas of diminished sensation in both feet dorsal and plantar surface.  No muscle atrophy in the legs.  Knees have reasonable range of motion without much effusion.  No groin pain with internal/external rotation of either leg.  Specialty Comments:  No specialty comments available.  Imaging: No results found.   PMFS History: Patient Active Problem List   Diagnosis Date Noted  . HNP (herniated nucleus pulposus), lumbar 07/25/2012  . Hypothyroidism 06/15/2010  . OBESITY 06/08/2010  . KNEE PAIN 06/08/2010  . DYSURIA 06/08/2010  . INSOMNIA 02/05/2010  . BACTERIAL VAGINITIS 06/17/2009  .  HYPERGLYCEMIA 06/17/2009  . ANA POSITIVE 06/17/2009  . UNSPECIFIED VITAMIN D DEFICIENCY 06/04/2009  . HYPERCHOLESTEROLEMIA 06/04/2009  . INCONTINENCE, URGE 06/01/2009  . ELEVATED BLOOD PRESSURE 04/29/2009  . GENERALIZED ANXIETY DISORDER 04/07/2008  . GERD 04/07/2008  . TREMOR 04/07/2008  . Major depressive disorder, single episode 04/07/2008  . RHINITIS, ALLERGIC NOS 03/22/2007  . FIBROMYALGIA 11/20/2006  . FATIGUE 11/20/2006  . TOBACCO DEPENDENCE 08/17/2006  . IRRITABLE BOWEL SYNDROME 08/17/2006  . ENDOMETRIOSIS 08/17/2006   Past Medical History:  Diagnosis Date  . Abnormal Pap smear of cervix   . Adrenal nodule ([place])   . Anemia   . Anxiety   . Arthritis    osteoarthritis  . Chronic gum disease   . Depression   . Diabetes mellitus without complication ([place])   . Fibromyalgia   . Hyperlipidemia   . Hypertension    on no meds   . Shortness of breath    related to fibromyalgia  . UTI (lower urinary tract infection)     Family History  Problem Relation Age of Onset  . Cancer Paternal Aunt        cervical  . Cancer Maternal Grandmother        breast  . Cancer Paternal Grandfather        lung    Past Surgical History:  Procedure Laterality Date  . ADENOIDECTOMY    . DENTAL SURGERY    . LUMBAR LAMINECTOMY/DECOMPRESSION MICRODISCECTOMY  07/25/2012   Procedure: LUMBAR LAMINECTOMY/DECOMPRESSION MICRODISCECTOMY 1 LEVEL;  Surgeon: Johnn Hai, MD;  Location: [facility];  Service: Orthopedics;  Laterality: N/A;  MICRODISCECTOMY L5-S1, FORAMINOTOMY L5-S1  . TUBAL LIGATION    . TYMPANOPLASTY     Social History   Occupational History  . Not on file.   Social History Main Topics  . Smoking status: Current Every Day Smoker    Packs/day: 0.50    Years: 13.00    Types: Cigarettes  . Smokeless tobacco: Never Used  . Alcohol use No     Comment: occassionally beer  . Drug use: No     Comment: hx of pills and marijuana  . Sexual activity: Yes    Birth control/ protection:  Surgical

## 2017-03-15 ENCOUNTER — Ambulatory Visit (HOSPITAL_COMMUNITY): Payer: Medicare Other | Admitting: Psychiatry

## 2017-03-15 ENCOUNTER — Telehealth (INDEPENDENT_AMBULATORY_CARE_PROVIDER_SITE_OTHER): Payer: Self-pay | Admitting: Orthopedic Surgery

## 2017-03-15 NOTE — Telephone Encounter (Signed)
Patient called saying the pharmacy is needing a pre authorization if it could be sent to her pharmacy, and if you could give her a call whenever it's been sent. Thank you. CB #  [phone]

## 2017-03-15 NOTE — Telephone Encounter (Signed)
Tried calling patient back to get clarification on what medication was needing authorization. No answer. LMVM for her to [facility] to discuss.

## 2017-04-19 ENCOUNTER — Ambulatory Visit: Payer: Medicare Other | Admitting: Family Medicine

## 2017-04-21 ENCOUNTER — Ambulatory Visit (HOSPITAL_COMMUNITY): Payer: Medicare Other | Admitting: Psychiatry

## 2017-05-29 ENCOUNTER — Ambulatory Visit (HOSPITAL_COMMUNITY): Payer: Medicare Other | Admitting: Psychiatry

## 2017-07-20 ENCOUNTER — Ambulatory Visit (HOSPITAL_COMMUNITY): Payer: Medicare Other | Admitting: Psychiatry

## 2017-09-08 ENCOUNTER — Other Ambulatory Visit: Payer: Self-pay | Admitting: Family Medicine

## 2017-09-08 DIAGNOSIS — E119 Type 2 diabetes mellitus without complications: Secondary | ICD-10-CM

## 2017-09-25 ENCOUNTER — Telehealth: Payer: Self-pay

## 2017-09-25 ENCOUNTER — Encounter (HOSPITAL_COMMUNITY): Payer: Self-pay | Admitting: Family Medicine

## 2017-09-25 ENCOUNTER — Ambulatory Visit (HOSPITAL_COMMUNITY)
Admission: EM | Admit: 2017-09-25 | Discharge: 2017-09-25 | Disposition: A | Discharge status: Home / Self Care | Payer: Medicare Other | Attending: Family Medicine | Admitting: Family Medicine

## 2017-09-25 DIAGNOSIS — R109 Unspecified abdominal pain: Secondary | ICD-10-CM | POA: Diagnosis present

## 2017-09-25 DIAGNOSIS — Z79899 Other long term (current) drug therapy: Secondary | ICD-10-CM | POA: Diagnosis not present

## 2017-09-25 DIAGNOSIS — E119 Type 2 diabetes mellitus without complications: Secondary | ICD-10-CM | POA: Insufficient documentation

## 2017-09-25 DIAGNOSIS — E039 Hypothyroidism, unspecified: Secondary | ICD-10-CM | POA: Diagnosis not present

## 2017-09-25 DIAGNOSIS — E559 Vitamin D deficiency, unspecified: Secondary | ICD-10-CM | POA: Insufficient documentation

## 2017-09-25 DIAGNOSIS — Z7984 Long term (current) use of oral hypoglycemic drugs: Secondary | ICD-10-CM | POA: Diagnosis not present

## 2017-09-25 DIAGNOSIS — K219 Gastro-esophageal reflux disease without esophagitis: Secondary | ICD-10-CM | POA: Diagnosis not present

## 2017-09-25 DIAGNOSIS — F329 Major depressive disorder, single episode, unspecified: Secondary | ICD-10-CM | POA: Diagnosis not present

## 2017-09-25 DIAGNOSIS — M1990 Unspecified osteoarthritis, unspecified site: Secondary | ICD-10-CM | POA: Insufficient documentation

## 2017-09-25 DIAGNOSIS — R1011 Right upper quadrant pain: Secondary | ICD-10-CM | POA: Diagnosis not present

## 2017-09-25 DIAGNOSIS — M797 Fibromyalgia: Secondary | ICD-10-CM | POA: Insufficient documentation

## 2017-09-25 DIAGNOSIS — Z87440 Personal history of urinary (tract) infections: Secondary | ICD-10-CM | POA: Insufficient documentation

## 2017-09-25 DIAGNOSIS — E7800 Pure hypercholesterolemia, unspecified: Secondary | ICD-10-CM | POA: Insufficient documentation

## 2017-09-25 DIAGNOSIS — F419 Anxiety disorder, unspecified: Secondary | ICD-10-CM | POA: Diagnosis not present

## 2017-09-25 DIAGNOSIS — K589 Irritable bowel syndrome without diarrhea: Secondary | ICD-10-CM | POA: Diagnosis not present

## 2017-09-25 DIAGNOSIS — F17210 Nicotine dependence, cigarettes, uncomplicated: Secondary | ICD-10-CM | POA: Diagnosis not present

## 2017-09-25 DIAGNOSIS — I10 Essential (primary) hypertension: Secondary | ICD-10-CM | POA: Insufficient documentation

## 2017-09-25 LAB — COMPREHENSIVE METABOLIC PANEL
ALT: 26 U/L (ref 14–54)
AST: 21 U/L (ref 15–41)
Albumin: 3.9 g/dL (ref 3.5–5.0)
Alkaline Phosphatase: 81 U/L (ref 38–126)
Anion gap: 11 (ref 5–15)
BUN: 9 mg/dL (ref 6–20)
CO2: 22 mmol/L (ref 22–32)
Calcium: 9.6 mg/dL (ref 8.9–10.3)
Chloride: 101 mmol/L (ref 101–111)
Creatinine, Ser: 0.91 mg/dL (ref 0.44–1.00)
GFR calc Af Amer: >60 mL/min (ref >60)
GFR calc non Af Amer: >60 mL/min (ref >60)
Glucose, Bld: 258 mg/dL — ABNORMAL HIGH (ref 65–99)
Potassium: 4.1 mmol/L (ref 3.5–5.1)
Sodium: 134 mmol/L — ABNORMAL LOW (ref 135–145)
Total Bilirubin: 0.5 mg/dL (ref 0.3–1.2)
Total Protein: 7.1 g/dL (ref 6.5–8.1)

## 2017-09-25 LAB — CBC
HCT: 43.6 % (ref 36.0–46.0)
Hemoglobin: 14.6 g/dL (ref 12.0–15.0)
MCH: 29.3 pg (ref 26.0–34.0)
MCHC: 33.5 g/dL (ref 30.0–36.0)
MCV: 87.6 fL (ref 78.0–100.0)
Platelets: 381 10*3/uL (ref 150–400)
RBC: 4.98 MIL/uL (ref 3.87–5.11)
RDW: 12.9 % (ref 11.5–15.5)
WBC: 10.5 10*3/uL (ref 4.0–10.5)

## 2017-09-25 LAB — LIPASE, BLOOD: Lipase: 39 U/L (ref 11–51)

## 2017-09-25 MED ORDER — OMEPRAZOLE 20 MG PO CPDR: 20.0000 mg | DELAYED_RELEASE_CAPSULE | Freq: Every day | ORAL | 0 refills | Status: DC

## 2017-09-25 NOTE — ED Provider Notes (Signed)
[facility]    CSN: [id number] Arrival date & time: 09/25/17  1230     History   Chief Complaint Chief Complaint  Patient presents with  . Abdominal Pain    HPI Cindy Baker is a 52 y.o. female.   Cindy Baker presents with complaints of right upper abdominal pain which has been worsening over the past few days. States she has a history of IBS with diarrhea, diarrhea has been unchanged. States she feels like she has increased belching and a bile taste to her mouth. Denies any previous similar. States worse after eating pizza. Improves with laying flat and resting. Pain 6/10 currently. Nausea without vomiting. Still has gallbladder. Normal urination. Pain radiates to her back at times. Hx of diabetes, arthritis, htn, fibromyalgia.    ROS per HPI.      Past Medical History:  Diagnosis Date  . Abnormal Pap smear of cervix   . Adrenal nodule ([place])   . Anemia   . Anxiety   . Arthritis    osteoarthritis  . Chronic gum disease   . Depression   . Diabetes mellitus without complication ([place])   . Fibromyalgia   . Hyperlipidemia   . Hypertension    on no meds   . Shortness of breath    related to fibromyalgia  . UTI (lower urinary tract infection)     Patient Active Problem List   Diagnosis Date Noted  . HNP (herniated nucleus pulposus), lumbar 07/25/2012  . Hypothyroidism 06/15/2010  . OBESITY 06/08/2010  . KNEE PAIN 06/08/2010  . DYSURIA 06/08/2010  . INSOMNIA 02/05/2010  . BACTERIAL VAGINITIS 06/17/2009  . HYPERGLYCEMIA 06/17/2009  . ANA POSITIVE 06/17/2009  . UNSPECIFIED VITAMIN D DEFICIENCY 06/04/2009  . HYPERCHOLESTEROLEMIA 06/04/2009  . INCONTINENCE, URGE 06/01/2009  . ELEVATED BLOOD PRESSURE 04/29/2009  . GENERALIZED ANXIETY DISORDER 04/07/2008  . GERD 04/07/2008  . TREMOR 04/07/2008  . Major depressive disorder, single episode 04/07/2008  . RHINITIS, ALLERGIC NOS 03/22/2007  . FIBROMYALGIA 11/20/2006  . FATIGUE 11/20/2006  . TOBACCO  DEPENDENCE 08/17/2006  . IRRITABLE BOWEL SYNDROME 08/17/2006  . ENDOMETRIOSIS 08/17/2006    Past Surgical History:  Procedure Laterality Date  . ADENOIDECTOMY    . DENTAL SURGERY    . LUMBAR LAMINECTOMY/DECOMPRESSION MICRODISCECTOMY  07/25/2012   Procedure: LUMBAR LAMINECTOMY/DECOMPRESSION MICRODISCECTOMY 1 LEVEL;  Surgeon: Johnn Hai, MD;  Location: [facility];  Service: Orthopedics;  Laterality: N/A;  MICRODISCECTOMY L5-S1, FORAMINOTOMY L5-S1  . TUBAL LIGATION    . TYMPANOPLASTY      OB History    Gravida  3   Para  2   Term  2   Preterm      AB  1   Living  2     SAB      TAB  1   Ectopic      Multiple      Live Births               Home Medications    Prior to Admission medications   Medication Sig Start Date End Date Taking? Authorizing Provider  albuterol (PROVENTIL HFA;VENTOLIN HFA) 108 (90 Base) MCG/ACT inhaler Inhale 2 puffs into the lungs every 4 (four) hours as needed for wheezing or shortness of breath. 12/28/16   Janne Napoleon, NP  buPROPion (WELLBUTRIN SR) 150 MG 12 hr tablet Take 1 tablet (150 mg total) by mouth 2 (two) times daily. Take 150 mg for 3 days. Increase to 150 mg twice daily 01/16/17  Dorena Dew, FNP  diclofenac (VOLTAREN) 75 MG EC tablet Take 75 mg by mouth 2 (two) times daily.    [provider]  diclofenac sodium (VOLTAREN) 1 % GEL Apply 2 g topically 2 (two) times daily. 02/08/17   Meredith Pel, MD  DULoxetine (CYMBALTA) 30 MG capsule Take 1 capsule (30 mg total) by mouth daily before breakfast. 09/08/16   Dorena Dew, FNP  exenatide (BYETTA) 5 MCG/0.02ML SOPN injection Inject 0.02 mLs (5 mcg total) into the skin 2 (two) times daily with a meal. 10/11/16   Dorena Dew, FNP  gabapentin (NEURONTIN) 300 MG capsule Take 1 capsule (300 mg total) by mouth 3 (three) times daily. 10/11/16   Dorena Dew, FNP  gemfibrozil (LOPID) 600 MG tablet Take 1 tablet (600 mg total) by mouth 2 (two) times daily before  a meal. 01/16/17   Dorena Dew, FNP  glucose blood (ONE TOUCH ULTRA TEST) test strip USE TO CHECK BLOOD SUGAR EVERY MORNING AND 2 HOURS AFTER BREAKFAST 09/08/17   Dorena Dew, FNP  Multiple Vitamin (MULTIVITAMIN WITH MINERALS) TABS Take 1 tablet by mouth daily.    [provider]  Omega-3 Fatty Acids (FISH OIL) 1200 MG CAPS Take 1 capsule by mouth daily.    [provider]  omeprazole (PRILOSEC) 20 MG capsule Take 1 capsule (20 mg total) by mouth daily. 09/25/17   Zigmund Gottron, NP  sitaGLIPtin-metformin (JANUMET) 50-1000 MG tablet Take 1 tablet by mouth 2 (two) times daily with a meal. 01/16/17   Dorena Dew, FNP  vitamin C (ASCORBIC ACID) 500 MG tablet Take 500 mg by mouth daily.    [provider]    Family History Family History  Problem Relation Age of Onset  . Cancer Paternal Aunt        cervical  . Cancer Maternal Grandmother        breast  . Cancer Paternal Grandfather        lung    Social History Social History   Tobacco Use  . Smoking status: Current Every Day Smoker    Packs/day: 0.50    Years: 13.00    Pack years: 6.50    Types: Cigarettes  . Smokeless tobacco: Never Used  Substance Use Topics  . Alcohol use: No    Comment: occassionally beer  . Drug use: No    Comment: hx of pills and marijuana     Allergies   Neomycin   Review of Systems Review of Systems   Physical Exam Triage Vital Signs ED Triage Vitals  Enc Vitals Group     BP 09/25/17 1317 122/81     Pulse Rate 09/25/17 1317 82     Resp 09/25/17 1317 (!) 22     Temp 09/25/17 1317 98.3 F (36.8 C)     Temp Source 09/25/17 1317 Oral     SpO2 09/25/17 1317 99 %     Weight --      Height --      Head Circumference --      Peak Flow --      Pain Score 09/25/17 1324 8     Pain Loc --      Pain Edu? --      Excl. in [place]? --    No data found.  Updated Vital Signs BP 122/81 (BP Location: Right Arm)   Pulse 82   Temp 98.3 F (36.8 C) (Oral)    Resp (!) 22  LMP  (LMP Unknown)   SpO2 99%   Visual Acuity Right Eye Distance:   Left Eye Distance:   Bilateral Distance:    Right Eye Near:   Left Eye Near:    Bilateral Near:     Physical Exam  Constitutional: She is oriented to person, place, and time. She appears well-developed and well-nourished. No distress.  Cardiovascular: Normal rate, regular rhythm and normal heart sounds.  Pulmonary/Chest: Effort normal and breath sounds normal.  Abdominal: Soft. Bowel sounds are normal. She exhibits distension. There is tenderness in the right upper quadrant. There is no rigidity, no rebound, no guarding and no CVA tenderness.  Neurological: She is alert and oriented to person, place, and time.  Skin: Skin is warm and dry.     UC Treatments / Results  Labs (all labs ordered are listed, but only abnormal results are displayed) Labs Reviewed  CBC  COMPREHENSIVE METABOLIC PANEL  LIPASE, BLOOD    EKG None Radiology No results found.  Procedures Procedures (including critical care time)  Medications Ordered in UC Medications - No data to display   Initial Impression / Assessment and Plan / UC Course  I have reviewed the triage vital signs and the nursing notes.  Pertinent labs & imaging results that were available during my care of the patient were reviewed by me and considered in my medical decision making (see chart for details).     Worsening RUQ pain. Worse after eating pizza. Concerning for gallbladder etiology. Basic labs including lipase drawn. Discussed with patient unable to collect ultrasound here from UC, recommended go to ED or close follow up with PCP for outpatient [place]. Patient states she prefers to work with her PCP rather than go to er, she will call her PCP. Will call patient with any concerning lab findings. Will start omeprazole if this has a gerd component. Encouraged low fat diet. Return precautions provided and discussed at length signs to go to ER.  Patient verbalized understanding and agreeable to plan.    Final Clinical Impressions(s) / UC Diagnoses   Final diagnoses:  Right upper quadrant abdominal pain    ED Discharge Orders        Ordered    omeprazole (PRILOSEC) 20 MG capsule  Daily     09/25/17 1354       Controlled Substance Prescriptions [place] Controlled Substance Registry consulted? Not Applicable   Zigmund Gottron, NP 09/25/17 [phone]

## 2017-09-25 NOTE — Discharge Instructions (Signed)
I am concerned about your gallbladder today and feel that an ultrasound may be appropriate to evaluate this, which we do not have here in urgent care. We will draw some labs today, will call you with any significant findings. Please follow up closely with your primary care provider and/or go to ER for worsening of symptoms as you may need an ultrasound. No fat diet.  Omeprazole daily.

## 2017-09-25 NOTE — ED Triage Notes (Signed)
Pt here for RUQ pain with radiation into flank and back. Reports that she has felt this discomfort for a while but it has worsened. She has a hx of IBS and has been having diarrhea. Reports also bile taste in her mouth.

## 2017-09-27 ENCOUNTER — Telehealth (HOSPITAL_COMMUNITY): Payer: Self-pay

## 2017-09-27 NOTE — Telephone Encounter (Signed)
Attempted to return patient call, no answer. Voicemail left.

## 2017-10-04 ENCOUNTER — Encounter: Payer: Self-pay | Admitting: Family Medicine

## 2017-10-04 ENCOUNTER — Ambulatory Visit (INDEPENDENT_AMBULATORY_CARE_PROVIDER_SITE_OTHER): Payer: Medicare Other | Admitting: Family Medicine

## 2017-10-04 VITALS — BP 138/82 | HR 100 | Temp 98.5°F | Resp 18 | Ht 64.0 in | Wt 166.0 lb

## 2017-10-04 DIAGNOSIS — Z794 Long term (current) use of insulin: Secondary | ICD-10-CM

## 2017-10-04 DIAGNOSIS — R1011 Right upper quadrant pain: Secondary | ICD-10-CM

## 2017-10-04 DIAGNOSIS — E119 Type 2 diabetes mellitus without complications: Secondary | ICD-10-CM

## 2017-10-04 DIAGNOSIS — Z205 Contact with and (suspected) exposure to viral hepatitis: Secondary | ICD-10-CM

## 2017-10-04 LAB — POCT URINALYSIS DIPSTICK
Bilirubin, UA: NEGATIVE
Blood, UA: NEGATIVE
Glucose, UA: 500
Ketones, UA: NEGATIVE
Leukocytes, UA: NEGATIVE
Nitrite, UA: NEGATIVE
Protein, UA: NEGATIVE
Spec Grav, UA: <=1.005 — AB (ref 1.010–1.025)
Urobilinogen, UA: 0.2 E.U./dL
pH, UA: 5.5 (ref 5.0–8.0)

## 2017-10-04 LAB — POCT GLYCOSYLATED HEMOGLOBIN (HGB A1C): Hemoglobin A1C: 11.5

## 2017-10-04 MED ORDER — SITAGLIPTIN PHOS-METFORMIN HCL 50-1000 MG PO TABS: 1.0000 | ORAL_TABLET | Freq: Two times a day (BID) | ORAL | 5 refills | Status: DC

## 2017-10-04 MED ORDER — EXENATIDE 5 MCG/0.02ML ~~LOC~~ SOPN
5.0000 ug | PEN_INJECTOR | Freq: Two times a day (BID) | SUBCUTANEOUS | 11 refills | Status: DC
Start: 2017-10-04 — End: 2018-06-04

## 2017-10-04 NOTE — Patient Instructions (Addendum)
  Refrain from taking Tylenol or NSAIDs. Will review abdominal US and follow up by phone with treatment plan.  The patient was given clear instructions to go to ER or return to medical center if symptoms do not improve, worsen or new problems develop. The patient verbalized understanding.     Abdominal Pain, Adult Many things can cause belly (abdominal) pain. Most times, belly pain is not dangerous. Many cases of belly pain can be watched and treated at home. Sometimes belly pain is serious, though. Your doctor will try to find the cause of your belly pain. Follow these instructions at home:  Take over-the-counter and prescription medicines only as told by your doctor. Do not take medicines that help you poop (laxatives) unless told to by your doctor.  Drink enough fluid to keep your pee (urine) clear or pale yellow.  Watch your belly pain for any changes.  Keep all follow-up visits as told by your doctor. This is important. Contact a doctor if:  Your belly pain changes or gets worse.  You are not hungry, or you lose weight without trying.  You are having trouble pooping (constipated) or have watery poop (diarrhea) for more than 2-3 days.  You have pain when you pee or poop.  Your belly pain wakes you up at night.  Your pain gets worse with meals, after eating, or with certain foods.  You are throwing up and cannot keep anything down.  You have a fever. Get help right away if:  Your pain does not go away as soon as your doctor says it should.  You cannot stop throwing up.  Your pain is only in areas of your belly, such as the right side or the left lower part of the belly.  You have bloody or black poop, or poop that looks like tar.  You have very bad pain, cramping, or bloating in your belly.  You have signs of not having enough fluid or water in your body (dehydration), such as: ? Dark pee, very little pee, or no pee. ? Cracked lips. ? Dry mouth. ? Sunken  eyes. ? Sleepiness. ? Weakness. This information is not intended to replace advice given to you by your health care provider. Make sure you discuss any questions you have with your health care provider. Document Released: 11/23/2007 Document Revised: 12/25/2015 Document Reviewed: 11/18/2015 Elsevier Interactive Patient Education  2018 [employer].

## 2017-10-04 NOTE — Progress Notes (Signed)
B

## 2017-10-04 NOTE — Progress Notes (Signed)
Subjective:    Patient ID: Cindy Baker, female    DOB: 1966-04-06, 52 y.o.   MRN: [id number]  HPI Cindy Baker, a 52 year old female with a history uncontrolled DM and hyperlipidemia presents complaining of abdominal pain. Patient has been lost to follow up for greater than 6 months. She is complaining of abdominal pain primarily to RUQ over the past several weeks. She was evaluated in urgent care on 09/25/2017 and was treated with Omeprazole. She did not undergo imaging at that time. Cindy Baker states that she has not had relief from abdominal pain. She says that pain is worsened after eating. She endorses occasional heartburn, vomiting, an diarrhea. She denies fever, diaphoresis, weakness, early satiety, weight loss, or rectal bleeding. She has a history of IBS with diarrhea. Current pain intensity is 5/10 characterized as intermittent cramping, and aching.  Cindy Baker reports possible exposure to hepatitis C. Her partner of 10 years was recently diagnosed with hepatitis C.    Past Medical History:  Diagnosis Date  . Abnormal Pap smear of cervix   . Adrenal nodule ([place])   . Anemia   . Anxiety   . Arthritis    osteoarthritis  . Chronic gum disease   . Depression   . Diabetes mellitus without complication ([place])   . Fibromyalgia   . Hyperlipidemia   . Hypertension    on no meds   . Shortness of breath    related to fibromyalgia  . UTI (lower urinary tract infection)    Social History   Socioeconomic History  . Marital status: Divorced    Spouse name: Not on file  . Number of children: Not on file  . Years of education: Not on file  . Highest education level: Not on file  Occupational History  . Not on file  Social Needs  . Financial resource strain: Not on file  . Food insecurity:    Worry: Not on file    Inability: Not on file  . Transportation needs:    Medical: Not on file    Non-medical: Not on file  Tobacco Use  . Smoking status: Current Every Day Smoker   Packs/day: 0.50    Years: 13.00    Pack years: 6.50    Types: Cigarettes  . Smokeless tobacco: Never Used  Substance and Sexual Activity  . Alcohol use: No    Comment: occassionally beer  . Drug use: No    Comment: hx of pills and marijuana  . Sexual activity: Yes    Birth control/protection: Surgical  Lifestyle  . Physical activity:    Days per week: Not on file    Minutes per session: Not on file  . Stress: Not on file  Relationships  . Social connections:    Talks on phone: Not on file    Gets together: Not on file    Attends religious service: Not on file    Active member of club or organization: Not on file    Attends meetings of clubs or organizations: Not on file    Relationship status: Not on file  . Intimate partner violence:    Fear of current or ex partner: Not on file    Emotionally abused: Not on file    Physically abused: Not on file    Forced sexual activity: Not on file  Other Topics Concern  . Not on file  Social History Narrative  . Not on file   Review of Systems  Constitutional:  Positive for fatigue. Negative for fever and unexpected weight change.  HENT: Negative.   Eyes: Negative.   Respiratory: Positive for cough.   Cardiovascular: Negative.   Gastrointestinal: Positive for abdominal distention, abdominal pain, diarrhea and nausea. Negative for blood in stool.  Endocrine: Negative for polydipsia, polyphagia and polyuria.  Genitourinary: Negative.   Musculoskeletal: Negative.   Skin: Negative.   Neurological: Negative.   Hematological: Negative.   Psychiatric/Behavioral: Negative.        Objective:   Physical Exam  Constitutional: She is oriented to person, place, and time. She appears well-developed.  HENT:  Head: Normocephalic and atraumatic.  Right Ear: External ear normal.  Left Ear: External ear normal.  Nose: Nose normal.  Mouth/Throat: Oropharynx is clear and moist.  Eyes: Pupils are equal, round, and reactive to light.  Neck:  Normal range of motion. Neck supple.  Cardiovascular: Normal rate, regular rhythm, normal heart sounds and intact distal pulses.  Pulmonary/Chest: Effort normal and breath sounds normal.  Abdominal: Soft. Bowel sounds are normal. There is tenderness in the right upper quadrant. There is no rebound and no CVA tenderness.  Neurological: She is alert and oriented to person, place, and time. She has normal reflexes.  Skin: Skin is warm and dry.  Psychiatric: She has a normal mood and affect. Her behavior is normal. Judgment and thought content normal.       BP 138/82 (BP Location: Left Arm, Patient Position: Sitting, Cuff Size: Normal) Comment: manually  Pulse 100   Temp 98.5 F (36.9 C) (Oral)   Resp 18   Ht 5\' 4"  (1.626 m)   Wt 166 lb (75.3 kg)   LMP  (LMP Unknown)   SpO2 99%   BMI 28.49 kg/m  Assessment & Plan:  1. Right upper quadrant abdominal pain Recommend low fat diet divided over small meals throughout the day.  Will follow up by phone after reviewing laboratory results - Helicobacter pylori abs-IgG+IgA, bld - US Abdomen Complete; Future - Amylase  2. Exposure to hepatitis C - Hepatitis C Antibody  3. Type 2 diabetes mellitus without complication, with long-term current use of insulin (HCC) Hemoglobin a1C  Is 11.5. She says that she has not been taking medications consistently or following a carbohydrate modified diet.  Will restart antidiabetic medications. Discussed the importance of taking medications consistently in order to achieve positive outcomes.  Goal of hemoglobin a1C is <7.  - HgB A1c - Urinalysis Dipstick - sitaGLIPtin-metformin (JANUMET) 50-1000 MG tablet; Take 1 tablet by mouth 2 (two) times daily with a meal.  Dispense: 60 tablet; Refill: 5 - exenatide (BYETTA) 5 MCG/0.02ML SOPN injection; Inject 0.02 mLs (5 mcg total) into the skin 2 (two) times daily with a meal.  Dispense: 1 pen; Refill: 11   RTC: Will follow up by phone after reviewing lab results  and abdominal ultrasound. Follow up in 1 month for chronic conditions   The patient was given clear instructions to go to ER or return to medical center if symptoms do not improve, worsen or new problems develop. The patient verbalized understanding.   Donia Pounds  MSN, FNP-C [facility] [address] [place], [place] [address] [phone]

## 2017-10-05 ENCOUNTER — Telehealth: Payer: Self-pay | Admitting: Family Medicine

## 2017-10-05 ENCOUNTER — Other Ambulatory Visit: Payer: Self-pay | Admitting: Family Medicine

## 2017-10-05 ENCOUNTER — Ambulatory Visit (HOSPITAL_COMMUNITY)
Admission: RE | Admit: 2017-10-05 | Discharge: 2017-10-05 | Disposition: A | Discharge status: Home / Self Care | Payer: Medicare Other | Source: Ambulatory Visit | Attending: Family Medicine | Admitting: Family Medicine

## 2017-10-05 DIAGNOSIS — R1011 Right upper quadrant pain: Secondary | ICD-10-CM

## 2017-10-05 DIAGNOSIS — B9681 Helicobacter pylori [H. pylori] as the cause of diseases classified elsewhere: Secondary | ICD-10-CM

## 2017-10-05 DIAGNOSIS — K2970 Gastritis, unspecified, without bleeding: Principal | ICD-10-CM

## 2017-10-05 MED ORDER — CLARITHROMYCIN 500 MG PO TABS: 500.0000 mg | ORAL_TABLET | Freq: Two times a day (BID) | ORAL | 0 refills | Status: AC

## 2017-10-05 MED ORDER — OMEPRAZOLE 20 MG PO CPDR
20.0000 mg | DELAYED_RELEASE_CAPSULE | Freq: Two times a day (BID) | ORAL | 0 refills | Status: DC
Start: 2017-10-05 — End: 2017-11-20

## 2017-10-05 MED ORDER — AMOXICILLIN 500 MG PO CAPS: 1000.0000 mg | ORAL_CAPSULE | Freq: Two times a day (BID) | ORAL | 0 refills | Status: AC

## 2017-10-05 NOTE — Telephone Encounter (Signed)
Cindy Baker a 52 year old female presented on 10/04/2017 complaining of right upper quadrant abdominal pain.  Review laboratory results which showed the following: Patient has a positive Helicobacter pylori.  Will send in triple therapy that includes amoxicillin, omeprazole, and clarithromycin.  Also reviewed abdominal ultrasound.   IMPRESSION: Increased echogenicity consistent fatty infiltration and/or hepatocellular disease. No focal abnormality identified. No gallstones or biliary distention.   We will schedule follow-up in 2 weeks after completing treatment plan.   Donia Pounds  MSN, FNP-C [facility] [address] [place], [place] [address] [phone]

## 2017-10-05 NOTE — Progress Notes (Signed)
Meds ordered this encounter  Medications  . omeprazole (PRILOSEC) 20 MG capsule    Sig: Take 1 capsule (20 mg total) by mouth 2 (two) times daily before a meal for 14 days.    Dispense:  30 capsule    Refill:  0  . clarithromycin (BIAXIN) 500 MG tablet    Sig: Take 1 tablet (500 mg total) by mouth 2 (two) times daily for 14 days.    Dispense:  28 tablet    Refill:  0  . amoxicillin (AMOXIL) 500 MG capsule    Sig: Take 2 capsules (1,000 mg total) by mouth 2 (two) times daily for 14 days.    Dispense:  56 capsule    Refill:  0     Donia Pounds  MSN, FNP-C [facility] [address] [place], [place] [address] [phone]

## 2017-10-06 ENCOUNTER — Telehealth: Payer: Self-pay | Admitting: Family Medicine

## 2017-10-06 DIAGNOSIS — R768 Other specified abnormal immunological findings in serum: Secondary | ICD-10-CM | POA: Insufficient documentation

## 2017-10-06 DIAGNOSIS — K760 Fatty (change of) liver, not elsewhere classified: Secondary | ICD-10-CM

## 2017-10-06 LAB — AMYLASE: Amylase: 37 U/L (ref 31–124)

## 2017-10-06 LAB — HELICOBACTER PYLORI ABS-IGG+IGA, BLD
H. pylori, IgA Abs: 11.9 units — ABNORMAL HIGH (ref 0.0–8.9)
H. pylori, IgG AbS: 4.3 Index Value — ABNORMAL HIGH (ref 0.00–0.79)

## 2017-10-06 LAB — HEPATITIS C ANTIBODY: Hep C Virus Ab: <0.1 s/co ratio (ref 0.0–0.9)

## 2017-10-06 NOTE — Telephone Encounter (Signed)
Cindy Baker, a 52 year old female with multiple co-morbidities presented on 10/04/2017 with abdominal pain primarily to right upper quadrant. Patient positive for helicobactor pylori. Will treat with triple therapy. Amoxicillin 1000 mg BID, clarithromycin 500 mg BID, and omeprazole 20 mg BID for 14 days.  Abdominal ultrasound showed the following:   IMPRESSION: Increased echogenicity consistent fatty infiltration and/or hepatocellular disease. No focal abnormality identified. No gallstones or biliary distention  Recommend a low fat diet and low impact cardiovascular exercise regimen.    Donia Pounds  MSN, FNP-C [facility] [address] [place], [place] [address] [phone]

## 2017-10-11 ENCOUNTER — Telehealth: Payer: Self-pay

## 2017-10-11 DIAGNOSIS — E119 Type 2 diabetes mellitus without complications: Secondary | ICD-10-CM

## 2017-10-11 DIAGNOSIS — F17200 Nicotine dependence, unspecified, uncomplicated: Secondary | ICD-10-CM

## 2017-10-11 DIAGNOSIS — F329 Major depressive disorder, single episode, unspecified: Secondary | ICD-10-CM

## 2017-10-11 DIAGNOSIS — F32A Depression, unspecified: Secondary | ICD-10-CM

## 2017-10-11 DIAGNOSIS — M797 Fibromyalgia: Secondary | ICD-10-CM

## 2017-10-11 DIAGNOSIS — E7800 Pure hypercholesterolemia, unspecified: Secondary | ICD-10-CM

## 2017-10-11 MED ORDER — DULOXETINE HCL 30 MG PO CPEP: 30.0000 mg | ORAL_CAPSULE | Freq: Every day | ORAL | 5 refills | Status: DC

## 2017-10-11 MED ORDER — PEN NEEDLES 31G X 6 MM MISC: 1.0000 | Freq: Two times a day (BID) | 0 refills | Status: DC

## 2017-10-11 MED ORDER — GLUCOSE BLOOD VI STRP: ORAL_STRIP | 0 refills | Status: DC

## 2017-10-11 MED ORDER — GEMFIBROZIL 600 MG PO TABS: 600.0000 mg | ORAL_TABLET | Freq: Two times a day (BID) | ORAL | 5 refills | Status: DC

## 2017-10-11 MED ORDER — BUPROPION HCL ER (SR) 150 MG PO TB12: 150.0000 mg | ORAL_TABLET | Freq: Two times a day (BID) | ORAL | 5 refills | Status: DC

## 2017-10-11 MED ORDER — GABAPENTIN 300 MG PO CAPS: 300.0000 mg | ORAL_CAPSULE | Freq: Three times a day (TID) | ORAL | 5 refills | Status: DC

## 2017-10-11 NOTE — Telephone Encounter (Signed)
Refills sent into pharmacy. Thanks!  

## 2017-10-25 ENCOUNTER — Telehealth: Payer: Self-pay

## 2017-10-25 NOTE — Telephone Encounter (Signed)
Spoke with patient, she was asking if she should continue omeprazole until next appointment. I advised to continue until next appointment.

## 2017-11-09 ENCOUNTER — Encounter: Payer: Self-pay | Admitting: Family Medicine

## 2017-11-09 ENCOUNTER — Ambulatory Visit (INDEPENDENT_AMBULATORY_CARE_PROVIDER_SITE_OTHER): Payer: Medicare Other | Admitting: Family Medicine

## 2017-11-09 VITALS — BP 134/70 | HR 79 | Temp 97.9°F | Resp 16 | Ht 64.0 in | Wt 160.0 lb

## 2017-11-09 DIAGNOSIS — E119 Type 2 diabetes mellitus without complications: Secondary | ICD-10-CM

## 2017-11-09 DIAGNOSIS — F17200 Nicotine dependence, unspecified, uncomplicated: Secondary | ICD-10-CM

## 2017-11-09 LAB — GLUCOSE, POCT (MANUAL RESULT ENTRY): POC Glucose: 177 mg/dl — AB (ref 70–99)

## 2017-11-09 LAB — POCT GLYCOSYLATED HEMOGLOBIN (HGB A1C): Hemoglobin A1C: 10.6 % — AB (ref 4.0–5.6)

## 2017-11-09 MED ORDER — GLUCOSE BLOOD VI STRP
ORAL_STRIP | 12 refills | Status: DC
Start: 2017-11-09 — End: 2018-06-04

## 2017-11-09 MED ORDER — ACCU-CHEK AVIVA CONNECT W/DEVICE KIT: 1.0000 | PACK | Freq: Three times a day (TID) | 0 refills | Status: DC

## 2017-11-09 NOTE — Progress Notes (Signed)
Subjective:    Patient ID: Cindy Baker, female    DOB: March 29, 1966, 52 y.o.   MRN: [id number]  Cindy Baker, a 52 year old female with a history of hypertension, hyperlipidemia, obesity and uncontrolled type 2 diabetes presents for follow-up.  Patient is primarily following up on uncontrolled type 2 diabetes.  Most recent hemoglobin A1c was 12.6.  Patient was restarted on all antidiabetic medications.  Patient states that she has been taking medications consistently.  She is now following a carbohydrate modify diet and checking blood sugars periodically.  She states that she has not checked blood sugars in the last couple of days due to having a broken glucometer.  She currently endorses paresthesias.  She denies weight loss, polyuria, polydipsia, or polyphagia.   Past Medical History:  Diagnosis Date  . Abnormal Pap smear of cervix   . Adrenal nodule ([place])   . Anemia   . Anxiety   . Arthritis    osteoarthritis  . Chronic gum disease   . Depression   . Diabetes mellitus without complication ([place])   . Fibromyalgia   . Hyperlipidemia   . Hypertension    on no meds   . Shortness of breath    related to fibromyalgia  . UTI (lower urinary tract infection)    Social History   Socioeconomic History  . Marital status: Divorced    Spouse name: Not on file  . Number of children: Not on file  . Years of education: Not on file  . Highest education level: Not on file  Occupational History  . Not on file  Social Needs  . Financial resource strain: Not on file  . Food insecurity:    Worry: Not on file    Inability: Not on file  . Transportation needs:    Medical: Not on file    Non-medical: Not on file  Tobacco Use  . Smoking status: Current Every Day Smoker    Packs/day: 0.50    Years: 13.00    Pack years: 6.50    Types: Cigarettes  . Smokeless tobacco: Never Used  Substance and Sexual Activity  . Alcohol use: No    Comment: occassionally beer  . Drug use: No   Comment: hx of pills and marijuana  . Sexual activity: Yes    Birth control/protection: Surgical  Lifestyle  . Physical activity:    Days per week: Not on file    Minutes per session: Not on file  . Stress: Not on file  Relationships  . Social connections:    Talks on phone: Not on file    Gets together: Not on file    Attends religious service: Not on file    Active member of club or organization: Not on file    Attends meetings of clubs or organizations: Not on file    Relationship status: Not on file  . Intimate partner violence:    Fear of current or ex partner: Not on file    Emotionally abused: Not on file    Physically abused: Not on file    Forced sexual activity: Not on file  Other Topics Concern  . Not on file  Social History Narrative  . Not on file   Review of Systems  Constitutional: Positive for fatigue. Negative for fever and unexpected weight change.  HENT: Negative.   Eyes: Negative.   Respiratory: Positive for cough.   Cardiovascular: Negative.   Gastrointestinal: Positive for abdominal distention, abdominal pain, diarrhea and nausea. Negative  for blood in stool.  Endocrine: Negative for polydipsia, polyphagia and polyuria.  Genitourinary: Negative.   Musculoskeletal: Negative.   Skin: Negative.   Neurological: Negative.   Hematological: Negative.   Psychiatric/Behavioral: Negative.        Objective:   Physical Exam  Constitutional: She is oriented to person, place, and time. She appears well-developed.  HENT:  Head: Normocephalic and atraumatic.  Right Ear: External ear normal.  Left Ear: External ear normal.  Nose: Nose normal.  Mouth/Throat: Oropharynx is clear and moist.  Eyes: Pupils are equal, round, and reactive to light.  Neck: Normal range of motion. Neck supple.  Cardiovascular: Normal rate, regular rhythm, normal heart sounds and intact distal pulses.  Pulmonary/Chest: Effort normal and breath sounds normal.  Abdominal: Soft. Bowel  sounds are normal. There is no rebound and no CVA tenderness.  Neurological: She is alert and oriented to person, place, and time. She has normal reflexes.  Skin: Skin is warm and dry.  Psychiatric: She has a normal mood and affect. Her behavior is normal. Judgment and thought content normal.       BP 134/70 (BP Location: Right Arm, Patient Position: Sitting, Cuff Size: Large)   Pulse 79   Temp 97.9 F (36.6 C) (Oral)   Resp 16   Ht _0  (1.626 m)   Wt 160 lb (72.6 kg)   LMP  (LMP Unknown)   SpO2 100%   BMI 27.46 kg/m  Assessment & Plan:,   Type 2 diabetes mellitus without complication, with long-term current use of insulin (HCC) Hemoglobin a1C  Is 10.6, which is decreased from previous value.  Goal is less than 7.  Also discussed that blood sugar should range between 110-140 fasting. Advised patient to take medications consistently in order to achieve positive outcomes. Also recommend a carbohydrate modify diet divided over 5-6 small meals throughout the day - HgB A1c - Glucose (CBG) - Blood Glucose Monitoring Suppl (ACCU-CHEK AVIVA CONNECT) w/Device KIT; 1 each by Does not apply route 4 (four) times daily -  before meals and at bedtime.  Dispense: 1 kit; Refill: 0 - glucose blood (ACCU-CHEK AVIVA) test strip; Three times per day prior to meals and at bedtime. Use as instructed  Dispense: 100 each; Refill: 12  Tobacco dependence Smoking cessation instruction/counseling given:  counseled patient on the dangers of tobacco use, advised patient to stop smoking, and reviewed strategies to maximize success    RTC: We will repeat hemoglobin A1c in 2 months   Donia Pounds  MSN, FNP-C Patient [facility] [address] [place], [place] [address] [phone]

## 2017-11-09 NOTE — Patient Instructions (Signed)

## 2017-11-14 ENCOUNTER — Telehealth: Payer: Self-pay

## 2017-11-14 MED ORDER — ACCU-CHEK SOFT TOUCH LANCETS MISC: 12 refills | Status: DC

## 2017-11-14 NOTE — Telephone Encounter (Signed)
Refill for lancets sent into pharmacy. Thanks!

## 2017-11-20 ENCOUNTER — Other Ambulatory Visit: Payer: Self-pay | Admitting: Family Medicine

## 2017-11-20 DIAGNOSIS — B9681 Helicobacter pylori [H. pylori] as the cause of diseases classified elsewhere: Secondary | ICD-10-CM

## 2017-11-20 DIAGNOSIS — K2970 Gastritis, unspecified, without bleeding: Principal | ICD-10-CM

## 2018-01-15 ENCOUNTER — Ambulatory Visit (INDEPENDENT_AMBULATORY_CARE_PROVIDER_SITE_OTHER): Payer: Medicare Other | Admitting: Family Medicine

## 2018-01-15 ENCOUNTER — Encounter: Payer: Self-pay | Admitting: Family Medicine

## 2018-01-15 VITALS — BP 134/80 | HR 81 | Temp 98.3°F | Resp 18 | Ht 64.0 in | Wt 151.0 lb

## 2018-01-15 DIAGNOSIS — E119 Type 2 diabetes mellitus without complications: Secondary | ICD-10-CM | POA: Diagnosis not present

## 2018-01-15 DIAGNOSIS — R768 Other specified abnormal immunological findings in serum: Secondary | ICD-10-CM

## 2018-01-15 LAB — POCT GLYCOSYLATED HEMOGLOBIN (HGB A1C): Hemoglobin A1C: 7.7 % — AB (ref 4.0–5.6)

## 2018-01-15 MED ORDER — ACCU-CHEK SOFT TOUCH LANCETS MISC: 12 refills | Status: DC

## 2018-01-15 MED ORDER — PREDNISONE 20 MG PO TABS: 40.0000 mg | ORAL_TABLET | Freq: Every day | ORAL | 0 refills | Status: AC

## 2018-01-15 MED ORDER — VITAMIN D (ERGOCALCIFEROL) 1.25 MG (50000 UNIT) PO CAPS
50000.0000 [IU] | ORAL_CAPSULE | ORAL | 0 refills | Status: AC
Start: 2018-01-15 — End: 2018-03-12

## 2018-01-15 NOTE — Progress Notes (Signed)
Diabetes Mellitus Type II, Follow-up:   Patient here for follow-up of Type 2 diabetes mellitus.  Known diabetic complications: cardiovascular disease and circulation . Patient states that her husband was diagnosed with liver cancer and has poor prognosis with 1 year to live. Patient states that she has a great deal of stress and responsibility due to this. Has fear when thinking about the future.  Patient with a hx of H. Pylori. Would like test for cure today. Reports having IBS with diarrhea symptoms. Symptoms were less when not eating gluten.   Circulation test performed by [facility] at home: Right leg 0.72 Left leg 0.81 Cardiovascular risk factors: diabetes mellitus, dyslipidemia, family history of premature cardiovascular disease, sedentary lifestyle and smoking/ tobacco exposure Current diabetic medications include janumet and byetta  Eye exam current (within one year): yes Weight trend: stable Prior visit with dietician: no Current diet: on average, 2 meals per day, on average, 2 fast food meals per week, on average, 2 servings fruit per day, on average, 2 servings vegetables per day Current exercise: Swimming intermittently.   Current monitoring regimen: home blood tests - daily Home blood sugar records: fasting range: 140 Any episodes of hypoglycemia? no   Is She on ACE inhibitor or angiotensin II receptor blocker?  Patient states that she is unable to tolerate.   Low fat/carbohydrate diet?  Yes patient states that she is following a gluten free diet.  Nicotine Abuse?  Yes Medication Compliance?  Yes Exercise?  Yes Alcohol Abuse?  No  Home BG Monitoring:  Checking 1 times a day.  Foot Exam:  1. Do you have any pain in your calf muscles when walking?  No 2. Do you have any pain in your foot or feet?  Yes  If yes, Non-specific foot pain 3. Have you had any problems with your feet since the last visit?  Yes.  If yes, patient states that she had a circulation test performed  by [facility] nurse in home.  4. Do you have any sores on your feet?  No.  If yes, which foot?  Neither 5. Have you noticed any blood or discharge on your socks or hose?  No 6. Does patient wear shoes that are closed toe, low heel, adequate toe room (no pointed toe), and breathable material like leather, canvas or suede?  No  PAST DIABETIC HISTORY:  1. History of amputations:  No If yes, location:   2. Previous foot ulcer:  No If yes, location Review of Systems  Constitutional: Negative.   HENT: Negative.   Eyes: Negative.   Respiratory: Negative.   Cardiovascular: Negative.   Gastrointestinal: Negative.   Genitourinary: Negative.   Musculoskeletal: Positive for joint pain (multiple lower extremities).  Skin: Negative.   Neurological: Negative.   Psychiatric/Behavioral: Negative.      Lab Results  Component Value Date   HGBA1C 7.7 (A) 01/15/2018    Lab Results  Component Value Date   MICROALBUR 3.8 09/08/2016    Lab Results  Component Value Date   CHOL 233 (H) 10/11/2016   HDL 32 (L) 10/11/2016   LDLCALC 154 (H) 10/11/2016   TRIG 233 (H) 10/11/2016   CHOLHDL 7.3 (H) 10/11/2016     Physical Exam  Constitutional: She is oriented to person, place, and time and well-developed, well-nourished, and in no distress. No distress.  HENT:  Head: Normocephalic and atraumatic.  Eyes: Pupils are equal, round, and reactive to light. Conjunctivae and EOM are normal.  Neck: Normal range of  motion. Neck supple.  Cardiovascular: Normal rate, regular rhythm and intact distal pulses. Exam reveals no gallop and no friction rub.  No murmur heard. Pulmonary/Chest: Effort normal and breath sounds normal. No respiratory distress. She has no wheezes.  Abdominal: Soft. Bowel sounds are normal. There is no tenderness.  Musculoskeletal: Normal range of motion. She exhibits no edema.       Right foot: There is tenderness. There is normal range of motion and normal capillary refill.       Left foot:  There is tenderness. There is normal range of motion and normal capillary refill.  Lymphadenopathy:    She has no cervical adenopathy.  Neurological: She is alert and oriented to person, place, and time. Gait normal.  Skin: Skin is warm and dry.  Psychiatric: Mood, memory, affect and judgment normal.  Crying throughout the exam.   Nursing note and vitals reviewed.    1. Type 2 diabetes mellitus without complication, without long-term current use of insulin (HCC) Continue with current medications A1 C- 7.7 We discussed that this is an improvement. Not at goal. - HgB A1c - CBC with Differential; Future - Comprehensive metabolic panel; Future - Lipid Panel; Future  2. Helicobacter pylori antibody positive Test ordered today.  - Helicobacter pylori abs-IgG+IgA, bld; Future     Recommendations: 1.  Patient is counseled on appropriate foot care. 2.  BP goal < 130/80. 3.  LDL goal of < 100, HDL > 40 and TG < 150. 4.  Eye Exam yearly and Dental Exam every 6 months. 5.  Dietary recommendations:  Low carb ADA diet 6.  Physical Activity recommendations: 40 minutes of vigorous activity daily.  7.  Medication recommendations at this time are as follows: no changes.  8.  Return to clinic in 3-4 months

## 2018-01-15 NOTE — Patient Instructions (Signed)
Continue with your current medications. Great job on bringing down that A1C!!!  Neuropathic Pain Neuropathic pain is pain caused by damage to the nerves that are responsible for certain sensations in your body (sensory nerves). The pain can be caused by damage to:  The sensory nerves that send signals to your spinal cord and brain (peripheral nervous system).  The sensory nerves in your brain or spinal cord (central nervous system).  Neuropathic pain can make you more sensitive to pain. What would be a minor sensation for most people may feel very painful if you have neuropathic pain. This is usually a long-term condition that can be difficult to treat. The type of pain can differ from person to person. It may start suddenly (acute), or it may develop slowly and last for a long time (chronic). Neuropathic pain may come and go as damaged nerves heal or may stay at the same level for years. It often causes emotional distress, loss of sleep, and a lower quality of life. What are the causes? The most common cause of damage to a sensory nerve is diabetes. Many other diseases and conditions can also cause neuropathic pain. Causes of neuropathic pain can be classified as:  Toxic. Many drugs and chemicals can cause toxic damage. The most common cause of toxic neuropathic pain is damage from drug treatment for cancer (chemotherapy).  Metabolic. This type of pain can happen when a disease causes imbalances that damage nerves. Diabetes is the most common of these diseases. Vitamin B deficiency caused by long-term alcohol abuse is another common cause.  Traumatic. Any injury that cuts, crushes, or stretches a nerve can cause damage and pain. A common example is feeling pain after losing an arm or leg (phantom limb pain).  Compression-related. If a sensory nerve gets trapped or compressed for a long period of time, the blood supply to the nerve can be cut off.  Vascular. Many blood vessel diseases can cause  neuropathic pain by decreasing blood supply and oxygen to nerves.  Autoimmune. This type of pain results from diseases in which the body's defense system mistakenly attacks sensory nerves. Examples of autoimmune diseases that can cause neuropathic pain include lupus and multiple sclerosis.  Infectious. Many types of viral infections can damage sensory nerves and cause pain. Shingles infection is a common cause of this type of pain.  Inherited. Neuropathic pain can be a symptom of many diseases that are passed down through families (genetic).  What are the signs or symptoms? The main symptom is pain. Neuropathic pain is often described as:  Burning.  Shock-like.  Stinging.  Hot or cold.  Itching.  How is this diagnosed? No single test can diagnose neuropathic pain. Your health care provider will do a physical exam and ask you about your pain. You may use a pain scale to describe how bad your pain is. You may also have tests to see if you have a high sensitivity to pain and to help find the cause and location of any sensory nerve damage. These tests may include:  Imaging studies, such as: ? X-rays. ? CT scan. ? MRI.  Nerve conduction studies to test how well nerve signals travel through your sensory nerves (electrodiagnostic testing).  Stimulating your sensory nerves through electrodes on your skin and measuring the response in your spinal cord and brain (somatosensory evoked potentials).  How is this treated? Treatment for neuropathic pain may change over time. You may need to try different treatment options or a combination of  treatments. Some options include:  Over-the-counter pain relievers.  Prescription medicines. Some medicines used to treat other conditions may also help neuropathic pain. These include medicines to: ? Control seizures (anticonvulsants). ? Relieve depression (antidepressants).  Prescription-strength pain relievers (narcotics). These are usually used  when other pain relievers do not help.  Transcutaneous nerve stimulation (TENS). This uses electrical currents to block painful nerve signals. The treatment is painless.  Topical and local anesthetics. These are medicines that numb the nerves. They can be injected as a nerve block or applied to the skin.  Alternative treatments, such as: ? Acupuncture. ? Meditation. ? Massage. ? Physical therapy. ? Pain management programs. ? Counseling.  Follow these instructions at home:  Learn as much as you can about your condition.  Take medicines only as directed by your health care provider.  Work closely with all your health care providers to find what works best for you.  Have a good support system at home.  Consider joining a chronic pain support group. Contact a health care provider if:  Your pain treatments are not helping.  You are having side effects from your medicines.  You are struggling with fatigue, mood changes, depression, or anxiety. This information is not intended to replace advice given to you by your health care provider. Make sure you discuss any questions you have with your health care provider. Document Released: 03/03/2004 Document Revised: 12/25/2015 Document Reviewed: 11/14/2013 Elsevier Interactive Patient Education  [employer].

## 2018-01-18 ENCOUNTER — Other Ambulatory Visit: Payer: Medicare Other

## 2018-01-18 DIAGNOSIS — R768 Other specified abnormal immunological findings in serum: Secondary | ICD-10-CM | POA: Diagnosis not present

## 2018-01-18 DIAGNOSIS — E119 Type 2 diabetes mellitus without complications: Secondary | ICD-10-CM | POA: Diagnosis not present

## 2018-01-20 LAB — COMPREHENSIVE METABOLIC PANEL
ALT: 7 IU/L (ref 0–32)
AST: 8 IU/L (ref 0–40)
Albumin/Globulin Ratio: 1.8 (ref 1.2–2.2)
Albumin: 4.3 g/dL (ref 3.5–5.5)
Alkaline Phosphatase: 70 IU/L (ref 39–117)
BUN/Creatinine Ratio: 14 (ref 9–23)
BUN: 11 mg/dL (ref 6–24)
Bilirubin Total: <0.2 mg/dL (ref 0.0–1.2)
CO2: 23 mmol/L (ref 20–29)
Calcium: 10.2 mg/dL (ref 8.7–10.2)
Chloride: 99 mmol/L (ref 96–106)
Creatinine, Ser: 0.8 mg/dL (ref 0.57–1.00)
GFR calc Af Amer: 99 mL/min/{1.73_m2} (ref >59)
GFR calc non Af Amer: 86 mL/min/{1.73_m2} (ref >59)
Globulin, Total: 2.4 g/dL (ref 1.5–4.5)
Glucose: 150 mg/dL — ABNORMAL HIGH (ref 65–99)
Potassium: 4.5 mmol/L (ref 3.5–5.2)
Sodium: 139 mmol/L (ref 134–144)
Total Protein: 6.7 g/dL (ref 6.0–8.5)

## 2018-01-20 LAB — LIPID PANEL
Chol/HDL Ratio: 6.4 ratio — ABNORMAL HIGH (ref 0.0–4.4)
Cholesterol, Total: 236 mg/dL — ABNORMAL HIGH (ref 100–199)
HDL: 37 mg/dL — ABNORMAL LOW (ref >39)
LDL Calculated: 161 mg/dL — ABNORMAL HIGH (ref 0–99)
Triglycerides: 189 mg/dL — ABNORMAL HIGH (ref 0–149)
VLDL Cholesterol Cal: 38 mg/dL (ref 5–40)

## 2018-01-20 LAB — CBC WITH DIFFERENTIAL/PLATELET
Basophils Absolute: 0 10*3/uL (ref 0.0–0.2)
Basos: 0 %
EOS (ABSOLUTE): 0.2 10*3/uL (ref 0.0–0.4)
Eos: 2 %
Hematocrit: 44.1 % (ref 34.0–46.6)
Hemoglobin: 14.3 g/dL (ref 11.1–15.9)
Immature Grans (Abs): 0 10*3/uL (ref 0.0–0.1)
Immature Granulocytes: 0 %
Lymphocytes Absolute: 3.2 10*3/uL — ABNORMAL HIGH (ref 0.7–3.1)
Lymphs: 36 %
MCH: 27.7 pg (ref 26.6–33.0)
MCHC: 32.4 g/dL (ref 31.5–35.7)
MCV: 85 fL (ref 79–97)
Monocytes Absolute: 0.3 10*3/uL (ref 0.1–0.9)
Monocytes: 3 %
Neutrophils Absolute: 5 10*3/uL (ref 1.4–7.0)
Neutrophils: 59 %
Platelets: 553 10*3/uL — ABNORMAL HIGH (ref 150–450)
RBC: 5.17 x10E6/uL (ref 3.77–5.28)
RDW: 15.2 % (ref 12.3–15.4)
WBC: 8.8 10*3/uL (ref 3.4–10.8)

## 2018-01-20 LAB — HELICOBACTER PYLORI ABS-IGG+IGA, BLD
H. pylori, IgA Abs: <9 units (ref 0.0–8.9)
H. pylori, IgG AbS: 2.51 Index Value — ABNORMAL HIGH (ref 0.00–0.79)

## 2018-01-22 ENCOUNTER — Telehealth: Payer: Self-pay

## 2018-01-22 MED ORDER — FLUTICASONE PROPIONATE 50 MCG/ACT NA SUSP: 2.0000 | Freq: Every day | NASAL | 6 refills | Status: AC

## 2018-01-22 MED ORDER — LEVOCETIRIZINE DIHYDROCHLORIDE 5 MG PO TABS: 5.0000 mg | ORAL_TABLET | Freq: Every evening | ORAL | 2 refills | Status: DC

## 2018-01-22 MED ORDER — ALBUTEROL SULFATE HFA 108 (90 BASE) MCG/ACT IN AERS: 2.0000 | INHALATION_SPRAY | RESPIRATORY_TRACT | 0 refills | Status: DC | PRN

## 2018-01-22 NOTE — Telephone Encounter (Signed)
I am not sure of the ear drops. I can send her flonase and an antihistamine.

## 2018-01-22 NOTE — Telephone Encounter (Signed)
Albuterol sent into pharmacy. Thanks!

## 2018-01-22 NOTE — Telephone Encounter (Signed)
Cindy Baker I have sent I in albuterol refill. Please advised on prednisone and ear drops. Thanks!

## 2018-01-23 NOTE — Telephone Encounter (Signed)
Called and left message for patient. Advised that we have sent int flonase, inhaler, and an antihistamine to pharmacy. Advised that we did not do ear drops and she will need to make an appointment if she needs anything else.

## 2018-02-28 ENCOUNTER — Ambulatory Visit: Payer: Self-pay | Admitting: Family Medicine

## 2018-06-04 ENCOUNTER — Ambulatory Visit (INDEPENDENT_AMBULATORY_CARE_PROVIDER_SITE_OTHER): Payer: Medicare Other | Admitting: Family Medicine

## 2018-06-04 ENCOUNTER — Encounter: Payer: Self-pay | Admitting: Family Medicine

## 2018-06-04 VITALS — BP 145/80 | HR 92 | Temp 98.8°F | Resp 16 | Ht 64.0 in | Wt 162.0 lb

## 2018-06-04 DIAGNOSIS — M797 Fibromyalgia: Secondary | ICD-10-CM | POA: Diagnosis not present

## 2018-06-04 DIAGNOSIS — Z1239 Encounter for other screening for malignant neoplasm of breast: Secondary | ICD-10-CM

## 2018-06-04 DIAGNOSIS — F329 Major depressive disorder, single episode, unspecified: Secondary | ICD-10-CM

## 2018-06-04 DIAGNOSIS — F17200 Nicotine dependence, unspecified, uncomplicated: Secondary | ICD-10-CM | POA: Diagnosis not present

## 2018-06-04 DIAGNOSIS — E7800 Pure hypercholesterolemia, unspecified: Secondary | ICD-10-CM | POA: Diagnosis not present

## 2018-06-04 DIAGNOSIS — Z1211 Encounter for screening for malignant neoplasm of colon: Secondary | ICD-10-CM

## 2018-06-04 DIAGNOSIS — E119 Type 2 diabetes mellitus without complications: Secondary | ICD-10-CM

## 2018-06-04 DIAGNOSIS — F32A Depression, unspecified: Secondary | ICD-10-CM

## 2018-06-04 LAB — POCT URINALYSIS DIPSTICK
Bilirubin, UA: NEGATIVE
Blood, UA: NEGATIVE
Glucose, UA: NEGATIVE
Ketones, UA: NEGATIVE
Nitrite, UA: NEGATIVE
Protein, UA: NEGATIVE
Spec Grav, UA: 1.015 (ref 1.010–1.025)
Urobilinogen, UA: 0.2 E.U./dL
pH, UA: 5.5 (ref 5.0–8.0)

## 2018-06-04 LAB — POCT GLYCOSYLATED HEMOGLOBIN (HGB A1C): Hemoglobin A1C: 9.5 % — AB (ref 4.0–5.6)

## 2018-06-04 MED ORDER — GABAPENTIN 300 MG PO CAPS: 300.0000 mg | ORAL_CAPSULE | Freq: Three times a day (TID) | ORAL | 5 refills | Status: DC

## 2018-06-04 MED ORDER — DULOXETINE HCL 30 MG PO CPEP: 30.0000 mg | ORAL_CAPSULE | Freq: Every day | ORAL | 5 refills | Status: DC

## 2018-06-04 MED ORDER — EXENATIDE 5 MCG/0.02ML ~~LOC~~ SOPN: 5.0000 ug | PEN_INJECTOR | Freq: Two times a day (BID) | SUBCUTANEOUS | 11 refills | Status: DC

## 2018-06-04 MED ORDER — GLUCOSE BLOOD VI STRP: ORAL_STRIP | 12 refills | Status: DC

## 2018-06-04 MED ORDER — ACCU-CHEK AVIVA CONNECT W/DEVICE KIT: 1.0000 | PACK | Freq: Three times a day (TID) | 0 refills | Status: AC

## 2018-06-04 MED ORDER — BUPROPION HCL ER (SR) 150 MG PO TB12: 150.0000 mg | ORAL_TABLET | Freq: Two times a day (BID) | ORAL | 5 refills | Status: DC

## 2018-06-04 MED ORDER — GEMFIBROZIL 600 MG PO TABS: 600.0000 mg | ORAL_TABLET | Freq: Two times a day (BID) | ORAL | 5 refills | Status: DC

## 2018-06-04 MED ORDER — PEN NEEDLES 31G X 6 MM MISC: 1.0000 | Freq: Two times a day (BID) | 0 refills | Status: DC

## 2018-06-04 MED ORDER — SITAGLIPTIN PHOS-METFORMIN HCL 50-1000 MG PO TABS: 1.0000 | ORAL_TABLET | Freq: Two times a day (BID) | ORAL | 5 refills | Status: DC

## 2018-06-04 NOTE — Progress Notes (Signed)
95

## 2018-06-04 NOTE — Progress Notes (Signed)
Patient [facility]   Progress Note: General Provider: Lanae Boast, FNP  SUBJECTIVE:   Cindy Baker is a 52 y.o. female who  has a past medical history of Abnormal Pap smear of cervix, Adrenal nodule ([place]), Anemia, Anxiety, Arthritis, Chronic gum disease, Depression, Diabetes mellitus without complication ([place]), Fibromyalgia, Hyperlipidemia, Hypertension, Shortness of breath, and UTI (lower urinary tract infection).. Patient presents today for Diabetes  Patient states that he partner is severely sick with hepatic cancer. She has not been taking care of herself as she has in the past. She states that her diet has been full of fast food, processed foods, and bread. [name] states that she has been under severe stress.  Needs refills on her medications. Denies missing doses.   Review of Systems  Constitutional: Negative.   HENT: Negative.   Eyes: Negative.   Respiratory: Negative.   Cardiovascular: Negative.   Gastrointestinal: Negative.   Genitourinary: Negative.   Musculoskeletal: Negative.   Skin: Negative.   Neurological: Negative.   Psychiatric/Behavioral: Negative.      OBJECTIVE: BP (!) 145/80 (BP Location: Left Arm, Patient Position: Sitting, Cuff Size: Normal)   Pulse 92   Temp 98.8 F (37.1 C) (Oral)   Resp 16   Ht '5\' 4"'$  (1.626 m)   Wt 162 lb (73.5 kg)   LMP  (LMP Unknown)   SpO2 99%   BMI 27.81 kg/m   Wt Readings from Last 3 Encounters:  06/04/18 162 lb (73.5 kg)  01/15/18 151 lb (68.5 kg)  11/09/17 160 lb (72.6 kg)     Physical Exam Vitals signs and nursing note reviewed.  Constitutional:      General: She is not in acute distress.    Appearance: She is well-developed.  HENT:     Head: Normocephalic and atraumatic.  Eyes:     Conjunctiva/sclera: Conjunctivae normal.     Pupils: Pupils are equal, round, and reactive to light.  Neck:     Musculoskeletal: Normal range of motion.  Cardiovascular:     Rate  and Rhythm: Normal rate and regular rhythm.     Heart sounds: Normal heart sounds.  Pulmonary:     Effort: Pulmonary effort is normal. No respiratory distress.     Breath sounds: Normal breath sounds.  Abdominal:     General: Bowel sounds are normal. There is no distension.     Palpations: Abdomen is soft.  Musculoskeletal: Normal range of motion.  Skin:    General: Skin is warm and dry.  Neurological:     Mental Status: She is alert and oriented to person, place, and time.  Psychiatric:        Behavior: Behavior normal.        Thought Content: Thought content normal.     ASSESSMENT/PLAN:  1. Type 2 diabetes mellitus without complication, without long-term current use of insulin (HCC) No medication changes at the present time. Encouraged stress relief and dietary changes.  - HgB A1c - Urinalysis Dipstick - exenatide (BYETTA) 5 MCG/0.02ML SOPN injection; Inject 0.02 mLs (5 mcg total) into the skin 2 (two) times daily with a meal.  Dispense: 1 pen; Refill: 11 - glucose blood (ACCU-CHEK AVIVA) test strip; Three times per day prior to meals and at bedtime. Use as instructed  Dispense: 100 each; Refill: 12 - sitaGLIPtin-metformin (JANUMET) 50-1000 MG tablet; Take 1 tablet by mouth 2 (two) times daily with a meal.  Dispense: 60 tablet; Refill: 5 - Blood Glucose Monitoring  Suppl (ACCU-CHEK AVIVA CONNECT) w/Device KIT; 1 each by Does not apply route 4 (four) times daily -  before meals and at bedtime.  Dispense: 1 kit; Refill: 0  2. Fibromyalgia - gabapentin (NEURONTIN) 300 MG capsule; Take 1 capsule (300 mg total) by mouth 3 (three) times daily.  Dispense: 90 capsule; Refill: 5  3. Depression, unspecified depression type - DULoxetine (CYMBALTA) 30 MG capsule; Take 1 capsule (30 mg total) by mouth daily before breakfast.  Dispense: 30 capsule; Refill: 5  4. Tobacco dependence - buPROPion (WELLBUTRIN SR) 150 MG 12 hr tablet; Take 1 tablet (150 mg total) by mouth 2 (two) times daily. Take  150 mg for 3 days. Increase to 150 mg twice daily  Dispense: 60 tablet; Refill: 5  5. HYPERCHOLESTEROLEMIA - gemfibrozil (LOPID) 600 MG tablet; Take 1 tablet (600 mg total) by mouth 2 (two) times daily before a meal.  Dispense: 60 tablet; Refill: 5  6. Screen for colon cancer - Ambulatory referral to Gastroenterology  7. Screening for breast cancer - MM Digital Screening; Future        The patient was given clear instructions to go to ER or return to medical center if symptoms do not improve, worsen or new problems develop. The patient verbalized understanding and agreed with plan of care.   Ms. Doug Sou. Nathaneil Canary, FNP-BC [facility] [address] [place], [place] [address] [phone]     This note has been created with Dragon speech recognition software and smart phrase technology. Any transcriptional errors are unintentional.

## 2018-06-04 NOTE — Patient Instructions (Signed)
Diabetes Mellitus and Nutrition When you have diabetes (diabetes mellitus), it is very important to have healthy eating habits because your blood sugar (glucose) levels are greatly affected by what you eat and drink. Eating healthy foods in the appropriate amounts, at about the same times every day, can help you:  Control your blood glucose.  Lower your risk of heart disease.  Improve your blood pressure.  Reach or maintain a healthy weight.  Every person with diabetes is different, and each person has different needs for a meal plan. Your health care provider may recommend that you work with a diet and nutrition specialist (dietitian) to make a meal plan that is best for you. Your meal plan may vary depending on factors such as:  The calories you need.  The medicines you take.  Your weight.  Your blood glucose, blood pressure, and cholesterol levels.  Your activity level.  Other health conditions you have, such as heart or kidney disease.  How do carbohydrates affect me? Carbohydrates affect your blood glucose level more than any other type of food. Eating carbohydrates naturally increases the amount of glucose in your blood. Carbohydrate counting is a method for keeping track of how many carbohydrates you eat. Counting carbohydrates is important to keep your blood glucose at a healthy level, especially if you use insulin or take certain oral diabetes medicines. It is important to know how many carbohydrates you can safely have in each meal. This is different for every person. Your dietitian can help you calculate how many carbohydrates you should have at each meal and for snack. Foods that contain carbohydrates include:  Bread, cereal, rice, pasta, and crackers.  Potatoes and corn.  Peas, beans, and lentils.  Milk and yogurt.  Fruit and juice.  Desserts, such as cakes, cookies, ice cream, and candy.  How does alcohol affect me? Alcohol can cause a sudden decrease in blood  glucose (hypoglycemia), especially if you use insulin or take certain oral diabetes medicines. Hypoglycemia can be a life-threatening condition. Symptoms of hypoglycemia (sleepiness, dizziness, and confusion) are similar to symptoms of having too much alcohol. If your health care provider says that alcohol is safe for you, follow these guidelines:  Limit alcohol intake to no more than 1 drink per day for nonpregnant women and 2 drinks per day for men. One drink equals 12 oz of beer, 5 oz of wine, or 1 oz of hard liquor.  Do not drink on an empty stomach.  Keep yourself hydrated with water, diet soda, or unsweetened iced tea.  Keep in mind that regular soda, juice, and other mixers may contain a lot of sugar and must be counted as carbohydrates.  What are tips for following this plan? Reading food labels  Start by checking the serving size on the label. The amount of calories, carbohydrates, fats, and other nutrients listed on the label are based on one serving of the food. Many foods contain more than one serving per package.  Check the total grams (g) of carbohydrates in one serving. You can calculate the number of servings of carbohydrates in one serving by dividing the total carbohydrates by 15. For example, if a food has 30 g of total carbohydrates, it would be equal to 2 servings of carbohydrates.  Check the number of grams (g) of saturated and trans fats in one serving. Choose foods that have low or no amount of these fats.  Check the number of milligrams (mg) of sodium in one serving. Most people   should limit total sodium intake to less than 2,300 mg per day.  Always check the nutrition information of foods labeled as "low-fat" or "nonfat". These foods may be higher in added sugar or refined carbohydrates and should be avoided.  Talk to your dietitian to identify your daily goals for nutrients listed on the label. Shopping  Avoid buying canned, premade, or processed foods. These  foods tend to be high in fat, sodium, and added sugar.  Shop around the outside edge of the grocery store. This includes fresh fruits and vegetables, bulk grains, fresh meats, and fresh dairy. Cooking  Use low-heat cooking methods, such as baking, instead of high-heat cooking methods like deep frying.  Cook using healthy oils, such as olive, canola, or sunflower oil.  Avoid cooking with butter, cream, or high-fat meats. Meal planning  Eat meals and snacks regularly, preferably at the same times every day. Avoid going long periods of time without eating.  Eat foods high in fiber, such as fresh fruits, vegetables, beans, and whole grains. Talk to your dietitian about how many servings of carbohydrates you can eat at each meal.  Eat 4-6 ounces of lean protein each day, such as lean meat, chicken, fish, eggs, or tofu. 1 ounce is equal to 1 ounce of meat, chicken, or fish, 1 egg, or 1/4 cup of tofu.  Eat some foods each day that contain healthy fats, such as avocado, nuts, seeds, and fish. Lifestyle   Check your blood glucose regularly.  Exercise at least 30 minutes 5 or more days each week, or as told by your health care provider.  Take medicines as told by your health care provider.  Do not use any products that contain nicotine or tobacco, such as cigarettes and e-cigarettes. If you need help quitting, ask your health care provider.  Work with a counselor or diabetes educator to identify strategies to manage stress and any emotional and social challenges. What are some questions to ask my health care provider?  Do I need to meet with a diabetes educator?  Do I need to meet with a dietitian?  What number can I call if I have questions?  When are the best times to check my blood glucose? Where to find more information:  American Diabetes Association: diabetes.org/food-and-fitness/food  Academy of Nutrition and Dietetics:  www.eatright.org/resources/health/diseases-and-conditions/diabetes  National Institute of Diabetes and Digestive and Kidney Diseases (NIH): www.niddk.nih.gov/health-information/diabetes/overview/diet-eating-physical-activity Summary  A healthy meal plan will help you control your blood glucose and maintain a healthy lifestyle.  Working with a diet and nutrition specialist (dietitian) can help you make a meal plan that is best for you.  Keep in mind that carbohydrates and alcohol have immediate effects on your blood glucose levels. It is important to count carbohydrates and to use alcohol carefully. This information is not intended to replace advice given to you by your health care provider. Make sure you discuss any questions you have with your health care provider. Document Released: 03/03/2005 Document Revised: 07/11/2016 Document Reviewed: 07/11/2016 Elsevier Interactive Patient Education  2018 [employer]  

## 2018-06-08 ENCOUNTER — Ambulatory Visit (HOSPITAL_COMMUNITY)
Admission: EM | Admit: 2018-06-08 | Discharge: 2018-06-08 | Disposition: A | Discharge status: Home / Self Care | Payer: Medicare Other | Attending: Internal Medicine | Admitting: Internal Medicine

## 2018-06-08 DIAGNOSIS — M25561 Pain in right knee: Secondary | ICD-10-CM | POA: Diagnosis not present

## 2018-06-08 DIAGNOSIS — M79601 Pain in right arm: Secondary | ICD-10-CM

## 2018-06-08 MED ORDER — DICLOFENAC SODIUM 1 % TD GEL: 4.0000 g | Freq: Four times a day (QID) | TRANSDERMAL | 0 refills | Status: DC | PRN

## 2018-06-08 MED ORDER — METHOCARBAMOL 500 MG PO TABS: 500.0000 mg | ORAL_TABLET | Freq: Two times a day (BID) | ORAL | 0 refills | Status: DC | PRN
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
## 2018-06-08 NOTE — Discharge Instructions (Signed)
Recommend start Robaxin muscle relaxer- take 1 tablet by mouth twice a day as needed. May use Voltaren gel- apply to affected areas up to 4 times a day as needed. May apply warm compresses to area for comfort. Follow-up with your PCP in 4 to 5 days if not improving.

## 2018-06-22 ENCOUNTER — Ambulatory Visit (INDEPENDENT_AMBULATORY_CARE_PROVIDER_SITE_OTHER): Payer: Medicare Other | Admitting: Orthopedic Surgery

## 2018-07-13 ENCOUNTER — Encounter: Payer: Self-pay | Admitting: Gastroenterology

## 2018-08-07 ENCOUNTER — Encounter: Payer: Self-pay | Admitting: Gastroenterology

## 2018-08-14 ENCOUNTER — Telehealth: Payer: Self-pay | Admitting: *Deleted

## 2018-08-14 ENCOUNTER — Ambulatory Visit (AMBULATORY_SURGERY_CENTER): Payer: Self-pay | Admitting: *Deleted

## 2018-08-14 VITALS — Ht 64.0 in | Wt 165.0 lb

## 2018-08-14 DIAGNOSIS — Z1211 Encounter for screening for malignant neoplasm of colon: Secondary | ICD-10-CM

## 2018-08-14 MED ORDER — NA SULFATE-K SULFATE-MG SULF 17.5-3.13-1.6 GM/177ML PO SOLN: 1.0000 | Freq: Once | ORAL | 0 refills | Status: AC

## 2018-08-14 NOTE — Telephone Encounter (Signed)
Thank you for all of this information. I recommend an OV first if she has concerns beyond the screening colonoscopy. Thanks.

## 2018-08-14 NOTE — Progress Notes (Signed)
No egg or soy allergy known to patient  No issues with past sedation with any surgeries  or procedures, no intubation problems  No diet pills per patient No home 02 use per patient  No blood thinners per patient  Pt denies issues with constipation  No A fib or A flutter  EMMI video sent to pt's e mail - declined   Pt questioned needing an egd due to a past hx of h pylori-  I sent Dr Tarri Glenn a TE- she said pt would need an OV if she has further issues other than a screening colon - pt wishes to keep her colon 3-10 as she has had transportation issues with a care partner and we made her an OV to discuss further issues 3-27 at 4 pm  And to discuss if she needs an EGD

## 2018-08-14 NOTE — Telephone Encounter (Signed)
Pt will keep her colon 08-28-2018 as scheduled at her request and we scheduled her an OV for 3-27 Friday at 4 pm to discuss other issues  [name]

## 2018-08-14 NOTE — Telephone Encounter (Signed)
Dr Tarri Glenn,  I saw Cindy Baker in [place] today- she has a colon scheduled for 08-28-2018 Tuesday with you as a screening-  She explained to me 1 yr ago she was dx'd with H Pylori, she was treated and then tested negative.  She has a long hx of IBS-- with explosive diarrhea, bloating - maybe 30 years of this- she has lost 15 lbs but has gained a few back. She is wondering if she needs to have an EGD - she is concerned that she may have an ulcer - she has been removing carbs for her DM and has had some improvement I explained to her the colon would show diverticulosis or issues with her IBS.  .  I explained to her she may need an OV for an EGD>   Does she need an Egd added to her colon??  with the above information.  Please advise.     Thanks, Cindy Baker pV

## 2018-08-28 ENCOUNTER — Encounter: Payer: Self-pay | Admitting: Gastroenterology

## 2018-08-28 ENCOUNTER — Ambulatory Visit (AMBULATORY_SURGERY_CENTER): Payer: Medicare Other | Admitting: Gastroenterology

## 2018-08-28 ENCOUNTER — Other Ambulatory Visit: Payer: Self-pay

## 2018-08-28 VITALS — BP 147/84 | HR 75 | Temp 98.2°F | Resp 16 | Ht 64.0 in | Wt 165.0 lb

## 2018-08-28 DIAGNOSIS — D128 Benign neoplasm of rectum: Secondary | ICD-10-CM

## 2018-08-28 DIAGNOSIS — Z1211 Encounter for screening for malignant neoplasm of colon: Secondary | ICD-10-CM | POA: Diagnosis not present

## 2018-08-28 DIAGNOSIS — D129 Benign neoplasm of anus and anal canal: Secondary | ICD-10-CM

## 2018-08-28 MED ORDER — SODIUM CHLORIDE 0.9 % IV SOLN: 500.0000 mL | Freq: Once | INTRAVENOUS | Status: DC

## 2018-08-28 NOTE — Patient Instructions (Signed)
Handouts Provided:  Polyps  YOU HAD AN ENDOSCOPIC PROCEDURE TODAY AT THE [facility]:   Refer to the procedure report that was given to you for any specific questions about what was found during the examination.  If the procedure report does not answer your questions, please call your gastroenterologist to clarify.  If you requested that your care partner not be given the details of your procedure findings, then the procedure report has been included in a sealed envelope for you to review at your convenience later.  YOU SHOULD EXPECT: Some feelings of bloating in the abdomen. Passage of more gas than usual.  Walking can help get rid of the air that was put into your GI tract during the procedure and reduce the bloating. If you had a lower endoscopy (such as a colonoscopy or flexible sigmoidoscopy) you may notice spotting of blood in your stool or on the toilet paper. If you underwent a bowel prep for your procedure, you may not have a normal bowel movement for a few days.  Please Note:  You might notice some irritation and congestion in your nose or some drainage.  This is from the oxygen used during your procedure.  There is no need for concern and it should clear up in a day or so.  SYMPTOMS TO REPORT IMMEDIATELY:   Following lower endoscopy (colonoscopy or flexible sigmoidoscopy):  Excessive amounts of blood in the stool  Significant tenderness or worsening of abdominal pains  Swelling of the abdomen that is new, acute  Fever of 100F or higher  For urgent or emergent issues, a gastroenterologist can be reached at any hour by calling [phone].   DIET:  We do recommend a small meal at first, but then you may proceed to your regular diet.  Drink plenty of fluids but you should avoid alcoholic beverages for 24 hours.  ACTIVITY:  You should plan to take it easy for the rest of today and you should NOT DRIVE or use heavy machinery until tomorrow (because of the sedation  medicines used during the test).    FOLLOW UP: Our staff will call the number listed on your records the next business day following your procedure to check on you and address any questions or concerns that you may have regarding the information given to you following your procedure. If we do not reach you, we will leave a message.  However, if you are feeling well and you are not experiencing any problems, there is no need to return our call.  We will assume that you have returned to your regular daily activities without incident.  If any biopsies were taken you will be contacted by phone or by letter within the next 1-3 weeks.  Please call us at [phone] if you have not heard about the biopsies in 3 weeks.    SIGNATURES/CONFIDENTIALITY: You and/or your care partner have signed paperwork which will be entered into your electronic medical record.  These signatures attest to the fact that that the information above on your After Visit Summary has been reviewed and is understood.  Full responsibility of the confidentiality of this discharge information lies with you and/or your care-partner.  

## 2018-08-28 NOTE — Progress Notes (Signed)
Pt's states no medical or surgical changes since previsit or office visit. 

## 2018-08-28 NOTE — Progress Notes (Signed)
Called to room to assist during endoscopic procedure.  Patient ID and intended procedure confirmed with present staff. Received instructions for my participation in the procedure from the performing physician.  

## 2018-08-28 NOTE — Op Note (Signed)
[place] Patient Name: Cindy Baker Procedure Date: 08/28/2018 9:42 AM MRN: [id number] Endoscopist: Thornton Park MD, MD Age: 53 Referring MD:  Date of Birth: Nov 28, 1965 Gender: Female Account #: [id number] Procedure:                Colonoscopy Indications:              Screening for colorectal malignant neoplasm, This                            is the patient's first colonoscopy. No known family                            history of colon cancer or polyps. History of IBS. Medicines:                See the Anesthesia note for documentation of the                            administered medications Procedure:                Pre-Anesthesia Assessment:                           - Prior to the procedure, a History and Physical                            was performed, and patient medications and                            allergies were reviewed. The patient's tolerance of                            previous anesthesia was also reviewed. The risks                            and benefits of the procedure and the sedation                            options and risks were discussed with the patient.                            All questions were answered, and informed consent                            was obtained. Prior Anticoagulants: The patient has                            taken no previous anticoagulant or antiplatelet                            agents. ASA Grade Assessment: II - A patient with                            mild systemic disease. After reviewing the risks  and benefits, the patient was deemed in                            satisfactory condition to undergo the procedure.                           After obtaining informed consent, the colonoscope                            was passed under direct vision. Throughout the                            procedure, the patient's blood pressure, pulse, and   oxygen saturations were monitored continuously. The                            Colonoscope was introduced through the anus and                            advanced to the the terminal ileum, with                            identification of the appendiceal orifice and IC                            valve. The colonoscopy was performed without                            difficulty. The patient tolerated the procedure                            well. The quality of the bowel preparation was                            excellent. The terminal ileum, ileocecal valve,                            appendiceal orifice, and rectum were photographed. Scope In: 9:49:53 AM Scope Out: 10:07:38 AM Scope Withdrawal Time: 0 hours 14 minutes 34 seconds  Total Procedure Duration: 0 hours 17 minutes 45 seconds  Findings:                 The perianal and digital rectal examinations were                            normal.                           A 3 mm polyp was found in the rectum. The polyp was                            sessile. The polyp was removed with a cold snare.                            Resection and  retrieval were complete. Estimated                            blood loss: none.                           The exam was otherwise without abnormality on                            direct and retroflexion views. Complications:            No immediate complications. Estimated blood loss:                            None. Estimated Blood Loss:     Estimated blood loss: none. Impression:               - One 3 mm polyp in the rectum, removed with a cold                            snare. Resected and retrieved.                           - The examination was otherwise normal on direct                            and retroflexion views. Recommendation:           - Patient has a contact number available for                            emergencies. The signs and symptoms of potential                             delayed complications were discussed with the                            patient. Return to normal activities tomorrow.                            Written discharge instructions were provided to the                            patient.                           - Resume regular diet today.                           - Continue present medications.                           - Await pathology results.                           - Repeat colonoscopy in 10 years for surveillance  whether the polyp is adenomatous or not given it's                            small size. Thornton Park MD, MD 08/28/2018 10:16:12 AM This report has been signed electronically.

## 2018-08-28 NOTE — Progress Notes (Signed)
PT taken to PACU. Monitors in place. VSS. Report given to RN. 

## 2018-08-29 ENCOUNTER — Telehealth: Payer: Self-pay

## 2018-08-29 NOTE — Telephone Encounter (Signed)
Patient called and said that she is feeling fine will call back if anything changes.

## 2018-08-29 NOTE — Telephone Encounter (Signed)
No answer, left message to call back later today, B.Iria Jamerson RN. 

## 2018-08-29 NOTE — Telephone Encounter (Signed)
Left message, 2nd call.

## 2018-09-01 ENCOUNTER — Encounter: Payer: Self-pay | Admitting: Gastroenterology

## 2018-09-03 ENCOUNTER — Ambulatory Visit: Payer: Self-pay | Admitting: Family Medicine

## 2018-09-10 ENCOUNTER — Ambulatory Visit: Payer: Self-pay | Admitting: Family Medicine

## 2018-09-14 ENCOUNTER — Ambulatory Visit: Payer: Self-pay | Admitting: Gastroenterology

## 2018-10-17 NOTE — Telephone Encounter (Signed)
Message sent to provider 

## 2018-10-25 NOTE — Telephone Encounter (Signed)
Message sent to provider 

## 2019-02-04 ENCOUNTER — Ambulatory Visit (INDEPENDENT_AMBULATORY_CARE_PROVIDER_SITE_OTHER): Payer: Medicare Other | Admitting: Family Medicine

## 2019-02-04 ENCOUNTER — Other Ambulatory Visit: Payer: Self-pay

## 2019-02-04 VITALS — BP 149/88 | HR 90 | Temp 98.5°F | Resp 16 | Wt 152.0 lb

## 2019-02-04 DIAGNOSIS — F32A Depression, unspecified: Secondary | ICD-10-CM

## 2019-02-04 DIAGNOSIS — F329 Major depressive disorder, single episode, unspecified: Secondary | ICD-10-CM

## 2019-02-04 DIAGNOSIS — E119 Type 2 diabetes mellitus without complications: Secondary | ICD-10-CM | POA: Diagnosis not present

## 2019-02-04 DIAGNOSIS — F17200 Nicotine dependence, unspecified, uncomplicated: Secondary | ICD-10-CM

## 2019-02-04 DIAGNOSIS — E7800 Pure hypercholesterolemia, unspecified: Secondary | ICD-10-CM

## 2019-02-04 DIAGNOSIS — R221 Localized swelling, mass and lump, neck: Secondary | ICD-10-CM

## 2019-02-04 DIAGNOSIS — Z01419 Encounter for gynecological examination (general) (routine) without abnormal findings: Secondary | ICD-10-CM

## 2019-02-04 DIAGNOSIS — R87610 Atypical squamous cells of undetermined significance on cytologic smear of cervix (ASC-US): Secondary | ICD-10-CM | POA: Diagnosis not present

## 2019-02-04 DIAGNOSIS — M797 Fibromyalgia: Secondary | ICD-10-CM

## 2019-02-04 LAB — POCT URINALYSIS DIPSTICK
Bilirubin, UA: NEGATIVE
Blood, UA: NEGATIVE
Glucose, UA: POSITIVE — AB
Ketones, UA: NEGATIVE
Leukocytes, UA: NEGATIVE
Nitrite, UA: NEGATIVE
Protein, UA: NEGATIVE
Spec Grav, UA: 1.025 (ref 1.010–1.025)
Urobilinogen, UA: 0.2 E.U./dL
pH, UA: 6 (ref 5.0–8.0)

## 2019-02-04 LAB — POCT GLYCOSYLATED HEMOGLOBIN (HGB A1C): Hemoglobin A1C: 13.2 % — AB (ref 4.0–5.6)

## 2019-02-04 LAB — GLUCOSE, POCT (MANUAL RESULT ENTRY)
POC Glucose: 327 mg/dl — AB (ref 70–99)
POC Glucose: 329 mg/dl — AB (ref 70–99)

## 2019-02-04 MED ORDER — ACCU-CHEK SOFT TOUCH LANCETS MISC: 12 refills | Status: AC

## 2019-02-04 MED ORDER — DULOXETINE HCL 30 MG PO CPEP: 30.0000 mg | ORAL_CAPSULE | Freq: Every day | ORAL | 5 refills | Status: DC

## 2019-02-04 MED ORDER — BUPROPION HCL ER (SR) 150 MG PO TB12: 150.0000 mg | ORAL_TABLET | Freq: Two times a day (BID) | ORAL | 5 refills | Status: DC

## 2019-02-04 MED ORDER — GLUCOSE BLOOD VI STRP: ORAL_STRIP | 12 refills | Status: AC

## 2019-02-04 MED ORDER — INSULIN LISPRO 100 UNIT/ML ~~LOC~~ SOLN
10.0000 [IU] | Freq: Once | SUBCUTANEOUS | Status: AC
  Administered 2019-02-04: 10 [IU] via SUBCUTANEOUS

## 2019-02-04 MED ORDER — GEMFIBROZIL 600 MG PO TABS: 600.0000 mg | ORAL_TABLET | Freq: Two times a day (BID) | ORAL | 5 refills | Status: DC

## 2019-02-04 MED ORDER — JANUMET 50-1000 MG PO TABS: 1.0000 | ORAL_TABLET | Freq: Two times a day (BID) | ORAL | 5 refills | Status: AC

## 2019-02-04 MED ORDER — GABAPENTIN 300 MG PO CAPS: 300.0000 mg | ORAL_CAPSULE | Freq: Three times a day (TID) | ORAL | 5 refills | Status: DC

## 2019-02-04 MED ORDER — BYDUREON 2 MG ~~LOC~~ PEN: 2.0000 mg | PEN_INJECTOR | SUBCUTANEOUS | 5 refills | Status: DC

## 2019-02-04 NOTE — Progress Notes (Signed)
Patient [facility]  Annual GYN Examination Provider: Lanae Boast, FNP   SUBJECTIVE: Cindy Baker is a 53 y.o. female who presents for her annual gynecological examination.  Current concerns: Patient states that she has been out of her medications for several months. She has a history of DM2, hyperlipidemia and htn. 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 She reports a nodule superior to the right clavicle. She states that it waxes and wanes. She does not report pain at the present time. She states that the nodule is more prevalent with straining and movement.     GYNECOLOGICAL HISTORY: No LMP recorded (lmp unknown). Patient is postmenopausal. Contraception: none Last Pap: 2013. Results were: abnormal Last mammogram: .2012 Results were: normal  OBSTETRIC HISTORY: OB History  Gravida Para Term Preterm AB Living  3 2 2   1 2   SAB TAB Ectopic Multiple Live Births    1          # Outcome Date GA Lbr Len/2nd Weight Sex Delivery Anes PTL Lv  3 TAB           2 Term           1 Term              The following portions of the patient's history were reviewed and updated as appropriate: allergies, current medications, past family history, past medical history, past social history, past surgical history and problem list.  REVIEW OF SYSTEMS: Review of Systems  Constitutional: Negative.   HENT: Negative.   Eyes: Negative.   Respiratory: Negative.   Cardiovascular: Negative.   Gastrointestinal: Negative.   Genitourinary: Negative.   Musculoskeletal: Negative.   Skin: Negative.   Neurological: Negative.   Psychiatric/Behavioral: Negative.       OBJECTIVE:  Physical Exam Constitutional:      General: She is not in acute distress.    Appearance: She is obese.  Genitourinary:     Pelvic exam was performed with patient in the lithotomy position.     Vulva,  inguinal canal, urethra, bladder, vagina, cervix, uterus, right adnexa, left adnexa and rectum normal.  HENT:     Head: Normocephalic and atraumatic.  Eyes:     Extraocular Movements: Extraocular movements intact.     Conjunctiva/sclera: Conjunctivae normal.     Pupils: Pupils are equal, round, and reactive to light.  Cardiovascular:     Rate and Rhythm: Normal rate.     Pulses: Normal pulses.  Pulmonary:     Effort: Pulmonary effort is normal.  Chest:     Breasts: Tanner Score is 5. Breasts are symmetrical.        Right: Normal.        Left: Normal.  Abdominal:     General: Abdomen is flat. Bowel sounds are normal.     Palpations: Abdomen is soft.     Tenderness: There is no abdominal tenderness.  Lymphadenopathy:     Upper Body:     Right upper body: No supraclavicular, axillary or pectoral adenopathy.     Left upper body: No supraclavicular, axillary or pectoral adenopathy.  Neurological:     Mental Status: She is alert and oriented to person, place, and time.  Psychiatric:        Mood and Affect: Mood normal.        Behavior: Behavior normal.  Thought Content: Thought content normal.        Judgment: Judgment normal.  Vitals signs and nursing note reviewed. Exam conducted with a chaperone present.      ASSESSMENT/PLAN:  1. Type 2 diabetes mellitus without complication, without long-term current use of insulin (HCC) - Glucose (CBG) - HgB A1c - Urinalysis Dipstick - insulin lispro (HUMALOG) injection 10 Units - Microalbumin, urine - Exenatide ER (BYDUREON) 2 MG PEN; Inject 2 mg into the skin once a week.  Dispense: 4 each; Refill: 5 - Lipid Panel - sitaGLIPtin-metformin (JANUMET) 50-1000 MG tablet; Take 1 tablet by mouth 2 (two) times daily with a meal.  Dispense: 60 tablet; Refill: 5 - Glucose (CBG) - glucose blood (ACCU-CHEK AVIVA) test strip; Three times per day prior to meals and at bedtime. Use as instructed  Dispense: 100 each; Refill: 12 - Lancets  (ACCU-CHEK SOFT TOUCH) lancets; Use as instructed  Dispense: 100 each; Refill: 12  2. Well woman exam with routine gynecological exam - MM Digital Screening; Future - Pap IG and HPV (high risk) DNA detection - Comprehensive metabolic panel - CBC with Differential - TSH  3. Tobacco dependence - buPROPion (WELLBUTRIN SR) 150 MG 12 hr tablet; Take 1 tablet (150 mg total) by mouth 2 (two) times daily. Take 150 mg for 3 days. Increase to 150 mg twice daily  Dispense: 60 tablet; Refill: 5  4. Depression, unspecified depression type - DULoxetine (CYMBALTA) 30 MG capsule; Take 1 capsule (30 mg total) by mouth daily before breakfast.  Dispense: 30 capsule; Refill: 5  5. Fibromyalgia - gabapentin (NEURONTIN) 300 MG capsule; Take 1 capsule (300 mg total) by mouth 3 (three) times daily.  Dispense: 90 capsule; Refill: 5  6. HYPERCHOLESTEROLEMIA - gemfibrozil (LOPID) 600 MG tablet; Take 1 tablet (600 mg total) by mouth 2 (two) times daily before a meal.  Dispense: 60 tablet; Refill: 5  7. Localized swelling, mass or lump of neck - US Soft Tissue Head/Neck; Future     Education reviewed: depression evaluation, safe sex/STD prevention, self breast exams and smoking cessation. Contraception: none. Follow up in: 3 months.    Return to care as scheduled and prn. Patient verbalized understanding and agreed with plan of care.   Ms. Doug Sou. Nathaneil Canary, FNP-BC [facility] [address] [place], [place] [address] [phone]

## 2019-02-04 NOTE — Patient Instructions (Addendum)
Please call and schedule your mammogram. I sent in an order for an ultrasound of your neck. The nurse will call you with the appointment.  I refilled all medications and changed the Byetta to Bydureon. This is once a week only.     Preventive Care 37-51 Years Old, Female Preventive care refers to visits with your health care provider and lifestyle choices that can promote health and wellness. This includes:  A yearly physical exam. This may also be called an annual well check.  Regular dental visits and eye exams.  Immunizations.  Screening for certain conditions.  Healthy lifestyle choices, such as eating a healthy diet, getting regular exercise, not using drugs or products that contain nicotine and tobacco, and limiting alcohol use. What can I expect for my preventive care visit? Physical exam Your health care provider will check your:  Height and weight. This may be used to calculate body mass index (BMI), which tells if you are at a healthy weight.  Heart rate and blood pressure.  Skin for abnormal spots. Counseling Your health care provider may ask you questions about your:  Alcohol, tobacco, and drug use.  Emotional well-being.  Home and relationship well-being.  Sexual activity.  Eating habits.  Work and work Statistician.  Method of birth control.  Menstrual cycle.  Pregnancy history. What immunizations do I need?  Influenza (flu) vaccine  This is recommended every year. Tetanus, diphtheria, and pertussis (Tdap) vaccine  You may need a Td booster every 10 years. Varicella (chickenpox) vaccine  You may need this if you have not been vaccinated. Zoster (shingles) vaccine  You may need this after age 61. Measles, mumps, and rubella (MMR) vaccine  You may need at least one dose of MMR if you were born in 1957 or later. You may also need a second dose. Pneumococcal conjugate (PCV13) vaccine  You may need this if you have certain conditions and were  not previously vaccinated. Pneumococcal polysaccharide (PPSV23) vaccine  You may need one or two doses if you smoke cigarettes or if you have certain conditions. Meningococcal conjugate (MenACWY) vaccine  You may need this if you have certain conditions. Hepatitis A vaccine  You may need this if you have certain conditions or if you travel or work in places where you may be exposed to hepatitis A. Hepatitis B vaccine  You may need this if you have certain conditions or if you travel or work in places where you may be exposed to hepatitis B. Haemophilus influenzae type b (Hib) vaccine  You may need this if you have certain conditions. Human papillomavirus (HPV) vaccine  If recommended by your health care provider, you may need three doses over 6 months. You may receive vaccines as individual doses or as more than one vaccine together in one shot (combination vaccines). Talk with your health care provider about the risks and benefits of combination vaccines. What tests do I need? Blood tests  Lipid and cholesterol levels. These may be checked every 5 years, or more frequently if you are over 17 years old.  Hepatitis C test.  Hepatitis B test. Screening  Lung cancer screening. You may have this screening every year starting at age 54 if you have a 30-pack-year history of smoking and currently smoke or have quit within the past 15 years.  Colorectal cancer screening. All adults should have this screening starting at age 84 and continuing until age 64. Your health care provider may recommend screening at age 21 if you  are at increased risk. You will have tests every 1-10 years, depending on your results and the type of screening test.  Diabetes screening. This is done by checking your blood sugar (glucose) after you have not eaten for a while (fasting). You may have this done every 1-3 years.  Mammogram. This may be done every 1-2 years. Talk with your health care provider about when  you should start having regular mammograms. This may depend on whether you have a family history of breast cancer.  BRCA-related cancer screening. This may be done if you have a family history of breast, ovarian, tubal, or peritoneal cancers.  Pelvic exam and Pap test. This may be done every 3 years starting at age 72. Starting at age 60, this may be done every 5 years if you have a Pap test in combination with an HPV test. Other tests  Sexually transmitted disease (STD) testing.  Bone density scan. This is done to screen for osteoporosis. You may have this scan if you are at high risk for osteoporosis. Follow these instructions at home: Eating and drinking  Eat a diet that includes fresh fruits and vegetables, whole grains, lean protein, and low-fat dairy.  Take vitamin and mineral supplements as recommended by your health care provider.  Do not drink alcohol if: ? Your health care provider tells you not to drink. ? You are pregnant, may be pregnant, or are planning to become pregnant.  If you drink alcohol: ? Limit how much you have to 0-1 drink a day. ? Be aware of how much alcohol is in your drink. In the [place] one drink equals one 12 oz bottle of beer (355 mL), one 5 oz glass of wine (148 mL), or one 1 oz glass of hard liquor (44 mL). Lifestyle  Take daily care of your teeth and gums.  Stay active. Exercise for at least 30 minutes on 5 or more days each week.  Do not use any products that contain nicotine or tobacco, such as cigarettes, e-cigarettes, and chewing tobacco. If you need help quitting, ask your health care provider.  If you are sexually active, practice safe sex. Use a condom or other form of birth control (contraception) in order to prevent pregnancy and STIs (sexually transmitted infections).  If told by your health care provider, take low-dose aspirin daily starting at age 62. What's next?  Visit your health care provider once a year for a well check visit.   Ask your health care provider how often you should have your eyes and teeth checked.  Stay up to date on all vaccines. This information is not intended to replace advice given to you by your health care provider. Make sure you discuss any questions you have with your health care provider. Document Released: 07/03/2015 Document Revised: 02/15/2018 Document Reviewed: 02/15/2018 Elsevier Patient Education  2020 [employer].

## 2019-02-05 ENCOUNTER — Other Ambulatory Visit: Payer: Self-pay | Admitting: Family Medicine

## 2019-02-05 LAB — COMPREHENSIVE METABOLIC PANEL
ALT: 12 IU/L (ref 0–32)
AST: 11 IU/L (ref 0–40)
Albumin/Globulin Ratio: 1.8 (ref 1.2–2.2)
Albumin: 4.6 g/dL (ref 3.8–4.9)
Alkaline Phosphatase: 101 IU/L (ref 39–117)
BUN/Creatinine Ratio: 9 (ref 9–23)
BUN: 8 mg/dL (ref 6–24)
Bilirubin Total: 0.2 mg/dL (ref 0.0–1.2)
CO2: 21 mmol/L (ref 20–29)
Calcium: 10.8 mg/dL — ABNORMAL HIGH (ref 8.7–10.2)
Chloride: 97 mmol/L (ref 96–106)
Creatinine, Ser: 0.9 mg/dL (ref 0.57–1.00)
GFR calc Af Amer: 86 mL/min/{1.73_m2} (ref >59)
GFR calc non Af Amer: 74 mL/min/{1.73_m2} (ref >59)
Globulin, Total: 2.5 g/dL (ref 1.5–4.5)
Glucose: 326 mg/dL — ABNORMAL HIGH (ref 65–99)
Potassium: 4.2 mmol/L (ref 3.5–5.2)
Sodium: 138 mmol/L (ref 134–144)
Total Protein: 7.1 g/dL (ref 6.0–8.5)

## 2019-02-05 LAB — LIPID PANEL
Chol/HDL Ratio: 8.7 ratio — ABNORMAL HIGH (ref 0.0–4.4)
Cholesterol, Total: 252 mg/dL — ABNORMAL HIGH (ref 100–199)
HDL: 29 mg/dL — ABNORMAL LOW (ref >39)
Triglycerides: 459 mg/dL — ABNORMAL HIGH (ref 0–149)

## 2019-02-05 LAB — CBC WITH DIFFERENTIAL/PLATELET
Basophils Absolute: 0 10*3/uL (ref 0.0–0.2)
Basos: 0 %
EOS (ABSOLUTE): 0.2 10*3/uL (ref 0.0–0.4)
Eos: 2 %
Hematocrit: 44.8 % (ref 34.0–46.6)
Hemoglobin: 15.1 g/dL (ref 11.1–15.9)
Immature Grans (Abs): 0 10*3/uL (ref 0.0–0.1)
Immature Granulocytes: 0 %
Lymphocytes Absolute: 2.9 10*3/uL (ref 0.7–3.1)
Lymphs: 27 %
MCH: 28.5 pg (ref 26.6–33.0)
MCHC: 33.7 g/dL (ref 31.5–35.7)
MCV: 85 fL (ref 79–97)
Monocytes Absolute: 0.4 10*3/uL (ref 0.1–0.9)
Monocytes: 4 %
Neutrophils Absolute: 7.4 10*3/uL — ABNORMAL HIGH (ref 1.4–7.0)
Neutrophils: 67 %
Platelets: 482 10*3/uL — ABNORMAL HIGH (ref 150–450)
RBC: 5.3 x10E6/uL — ABNORMAL HIGH (ref 3.77–5.28)
RDW: 13.2 % (ref 11.7–15.4)
WBC: 11 10*3/uL — ABNORMAL HIGH (ref 3.4–10.8)

## 2019-02-05 LAB — MICROALBUMIN, URINE: Microalbumin, Urine: 13.1 ug/mL

## 2019-02-05 LAB — TSH: TSH: 2.75 u[IU]/mL (ref 0.450–4.500)

## 2019-02-11 ENCOUNTER — Other Ambulatory Visit: Payer: Self-pay | Admitting: Family Medicine

## 2019-02-11 ENCOUNTER — Encounter: Payer: Self-pay | Admitting: Family Medicine

## 2019-02-11 DIAGNOSIS — R87610 Atypical squamous cells of undetermined significance on cytologic smear of cervix (ASC-US): Secondary | ICD-10-CM

## 2019-02-11 LAB — PAP IG AND HPV HIGH-RISK: HPV, high-risk: POSITIVE — AB

## 2019-02-11 NOTE — Progress Notes (Signed)
Your labs are elevated due to not taking your medications. We will repeat everything at your next visit. Please remember to come fasting.

## 2019-02-11 NOTE — Progress Notes (Signed)
Referring to GYN for abnormal pap smear ASCUS +HPV

## 2019-02-13 ENCOUNTER — Ambulatory Visit (HOSPITAL_COMMUNITY)
Admission: RE | Admit: 2019-02-13 | Discharge: 2019-02-13 | Disposition: A | Discharge status: Home / Self Care | Payer: Medicare Other | Source: Ambulatory Visit | Attending: Family Medicine | Admitting: Family Medicine

## 2019-02-13 ENCOUNTER — Other Ambulatory Visit: Payer: Self-pay

## 2019-02-13 DIAGNOSIS — R221 Localized swelling, mass and lump, neck: Secondary | ICD-10-CM | POA: Insufficient documentation

## 2019-02-14 ENCOUNTER — Telehealth: Payer: Self-pay

## 2019-02-14 NOTE — Telephone Encounter (Signed)
Called, no answer. Left a message to call back. Thanks!  

## 2019-02-14 NOTE — Telephone Encounter (Signed)
-----   Message from Lanae Boast, [place] sent at 02/14/2019  8:43 AM EDT ----- Please let patient know that the ultrasound of her neck did not show any enlarged lymph nodes or glands. There is a small benign cyst on the left lobe of the thyroid. We will keep an eye on it. It should not cause any problems. The left carotid artery showed plaque. You need to make sure you are taking your cholesterol medication as this could lead to heart attack and stroke.

## 2019-02-15 NOTE — Telephone Encounter (Signed)
Called, no answer. Left a message to call back. Thanks!  

## 2019-02-18 NOTE — Telephone Encounter (Signed)
Patient called back. Was advised that ultrasound did not show any enlarged lymph nodes only a benign cyst on left lobe of thyroid. Advised we will keep an eye on this. Advised that left carotid artery showed plaque and she should make sure to eat a low fat/ low cholesterol diet and take cholesterol meds every day as directed and this could lead to heart attack or stroke. Patient verbalized understanding. Thanks!

## 2019-02-20 ENCOUNTER — Telehealth: Payer: Self-pay | Admitting: Family Medicine

## 2019-02-20 NOTE — Telephone Encounter (Signed)
Called and spoke with patient, advised that she had been sent to [facility] [place] and number to their office was provided. Thanks !

## 2019-02-26 ENCOUNTER — Telehealth: Payer: Self-pay

## 2019-02-26 NOTE — Telephone Encounter (Signed)
Bing Ree NP from [employer] Starling Manns, NP) called today just to report that she had a visit with patient and her A1c was 11.6 and she had +2 protein  in her urine.

## 2019-02-27 ENCOUNTER — Encounter (HOSPITAL_COMMUNITY): Payer: Self-pay | Admitting: *Deleted

## 2019-02-27 ENCOUNTER — Encounter (HOSPITAL_COMMUNITY): Payer: Self-pay

## 2019-02-27 ENCOUNTER — Ambulatory Visit: Payer: Medicare Other | Admitting: Orthopedic Surgery

## 2019-02-27 NOTE — Telephone Encounter (Signed)
Noted. That is an improvement from her last visit. Her A1C then was over 13. Please inform the nurse that this is an improvement.

## 2019-03-12 DIAGNOSIS — Z0000 Encounter for general adult medical examination without abnormal findings: Secondary | ICD-10-CM | POA: Diagnosis not present

## 2019-03-12 DIAGNOSIS — Z79899 Other long term (current) drug therapy: Secondary | ICD-10-CM | POA: Diagnosis not present

## 2019-03-18 ENCOUNTER — Telehealth: Payer: Self-pay | Admitting: Family Medicine

## 2019-03-18 MED ORDER — ALBUTEROL SULFATE HFA 108 (90 BASE) MCG/ACT IN AERS: 2.0000 | INHALATION_SPRAY | RESPIRATORY_TRACT | 2 refills | Status: AC | PRN

## 2019-03-18 NOTE — Telephone Encounter (Signed)
Refill has been sent into pharmacy. Thanks!  

## 2019-03-28 ENCOUNTER — Other Ambulatory Visit: Payer: Self-pay | Admitting: Family

## 2019-03-28 DIAGNOSIS — M79604 Pain in right leg: Secondary | ICD-10-CM

## 2019-03-28 DIAGNOSIS — M79605 Pain in left leg: Secondary | ICD-10-CM

## 2019-03-29 ENCOUNTER — Ambulatory Visit
Admission: RE | Admit: 2019-03-29 | Discharge: 2019-03-29 | Disposition: A | Discharge status: Home / Self Care | Payer: Medicare Other | Source: Ambulatory Visit | Attending: Family Medicine | Admitting: Family Medicine

## 2019-03-29 ENCOUNTER — Other Ambulatory Visit: Payer: Self-pay

## 2019-03-29 ENCOUNTER — Ambulatory Visit
Admission: RE | Admit: 2019-03-29 | Discharge: 2019-03-29 | Disposition: A | Discharge status: Home / Self Care | Payer: Medicare Other | Source: Ambulatory Visit | Attending: Family | Admitting: Family

## 2019-03-29 ENCOUNTER — Other Ambulatory Visit: Payer: Self-pay | Admitting: Family

## 2019-03-29 DIAGNOSIS — Z01419 Encounter for gynecological examination (general) (routine) without abnormal findings: Secondary | ICD-10-CM

## 2019-03-29 DIAGNOSIS — M25511 Pain in right shoulder: Secondary | ICD-10-CM

## 2019-04-02 ENCOUNTER — Other Ambulatory Visit: Payer: Self-pay | Admitting: Family Medicine

## 2019-04-02 DIAGNOSIS — R928 Other abnormal and inconclusive findings on diagnostic imaging of breast: Secondary | ICD-10-CM

## 2019-04-09 ENCOUNTER — Ambulatory Visit
Admission: RE | Admit: 2019-04-09 | Discharge: 2019-04-09 | Disposition: A | Discharge status: Home / Self Care | Payer: Medicare Other | Source: Ambulatory Visit | Attending: Family Medicine | Admitting: Family Medicine

## 2019-04-09 ENCOUNTER — Other Ambulatory Visit: Payer: Self-pay

## 2019-04-09 DIAGNOSIS — R928 Other abnormal and inconclusive findings on diagnostic imaging of breast: Secondary | ICD-10-CM

## 2019-04-25 ENCOUNTER — Other Ambulatory Visit: Payer: Self-pay

## 2019-04-25 ENCOUNTER — Other Ambulatory Visit (HOSPITAL_COMMUNITY)
Admission: RE | Admit: 2019-04-25 | Discharge: 2019-04-25 | Disposition: A | Discharge status: Home / Self Care | Payer: Medicare Other | Source: Ambulatory Visit | Attending: Obstetrics and Gynecology | Admitting: Obstetrics and Gynecology

## 2019-04-25 ENCOUNTER — Ambulatory Visit (INDEPENDENT_AMBULATORY_CARE_PROVIDER_SITE_OTHER): Payer: Medicare Other | Admitting: Obstetrics and Gynecology

## 2019-04-25 ENCOUNTER — Encounter: Payer: Self-pay | Admitting: Obstetrics and Gynecology

## 2019-04-25 VITALS — BP 148/83 | HR 90 | Wt 152.1 lb

## 2019-04-25 DIAGNOSIS — Z3202 Encounter for pregnancy test, result negative: Secondary | ICD-10-CM

## 2019-04-25 DIAGNOSIS — R87610 Atypical squamous cells of undetermined significance on cytologic smear of cervix (ASC-US): Secondary | ICD-10-CM | POA: Diagnosis not present

## 2019-04-25 DIAGNOSIS — R87619 Unspecified abnormal cytological findings in specimens from cervix uteri: Secondary | ICD-10-CM | POA: Diagnosis not present

## 2019-04-25 DIAGNOSIS — R87810 Cervical high risk human papillomavirus (HPV) DNA test positive: Secondary | ICD-10-CM | POA: Insufficient documentation

## 2019-04-25 LAB — POCT URINE PREGNANCY: Preg Test, Ur: NEGATIVE

## 2019-04-25 NOTE — Progress Notes (Signed)
Patient ID: Cindy Baker, female   DOB: 17-Feb-1966, 53 y.o.   MRN: [id number]    GYNECOLOGY CLINIC COLPOSCOPY PROCEDURE NOTE  53 y.o. [id number] here for colposcopy for ASCUS with POSITIVE high risk HPV pap smear on 01/2019. Discussed role for HPV in cervical dysplasia, need for surveillance.  Patient given informed consent, signed copy in the chart, time out was performed.  Placed in lithotomy position. Cervix viewed with speculum and colposcope after application of acetic acid.   Colposcopy adequate? Yes  acetowhite lesion(s) noted at 12 & 6 o'clock; corresponding biopsies obtained.  ECC specimen obtained. All specimens were labelled and sent to pathology.   Patient was given post procedure instructions.  Will follow up pathology and manage accordingly.  Routine preventative health maintenance measures emphasized.    Arlina Robes, MD, [place] Attending [place] for [place]

## 2019-04-25 NOTE — Progress Notes (Signed)
Pt is here for colposcopy for abnormal pap smear.

## 2019-04-25 NOTE — Patient Instructions (Signed)
Colposcopy, Care After This sheet gives you information about how to care for yourself after your procedure. Your doctor may also give you more specific instructions. If you have problems or questions, contact your doctor. What can I expect after the procedure? If you did not have a tissue sample removed (did not have a biopsy), you may only have some spotting for a few days. You can go back to your normal activities. If you had a tissue sample removed, it is common to have:  Soreness and pain. This may last for a few days.  Light-headedness.  Mild bleeding from your vagina or dark-colored, grainy discharge from your vagina. This may last for a few days. You may need to wear a sanitary pad.  Spotting for at least 48 hours after the procedure. Follow these instructions at home:   Take over-the-counter and prescription medicines only as told by your doctor. Ask your doctor what medicines you can start taking again. This is very important if you take blood-thinning medicine.  Do not drive or use heavy machinery while taking prescription pain medicine.  For 3 days, or as long as your doctor tells you, avoid: ? Douching. ? Using tampons. ? Having sex.  If you use birth control (contraception), keep using it.  Limit activity for the first day after the procedure. Ask your doctor what activities are safe for you.  It is up to you to get the results of your procedure. Ask your doctor when your results will be ready.  Keep all follow-up visits as told by your doctor. This is important. Contact a doctor if:  You get a skin rash. Get help right away if:  You are bleeding a lot from your vagina. It is a lot of bleeding if you are using more than one pad an hour for 2 hours in a row.  You have clumps of blood (blood clots) coming from your vagina.  You have a fever.  You have chills  You have pain in your lower belly (pelvic area).  You have signs of infection, such as vaginal  discharge that is: ? Different than usual. ? Yellow. ? Bad-smelling.  You have very pain or cramps in your lower belly that do not get better with medicine.  You feel light-headed.  You feel dizzy.  You pass out (faint). Summary  If you did not have a tissue sample removed (did not have a biopsy), you may only have some spotting for a few days. You can go back to your normal activities.  If you had a tissue sample removed, it is common to have mild pain and spotting for 48 hours.  For 3 days, or as long as your doctor tells you, avoid douching, using tampons and having sex.  Get help right away if you have bleeding, very bad pain, or signs of infection. This information is not intended to replace advice given to you by your health care provider. Make sure you discuss any questions you have with your health care provider. Document Released: 11/23/2007 Document Revised: 05/19/2017 Document Reviewed: 02/24/2016 Elsevier Patient Education  2020 Elsevier Inc.  

## 2019-04-29 LAB — SURGICAL PATHOLOGY

## 2019-06-08 ENCOUNTER — Other Ambulatory Visit: Payer: Self-pay | Admitting: Family Medicine

## 2019-06-08 DIAGNOSIS — F32A Depression, unspecified: Secondary | ICD-10-CM

## 2019-06-08 DIAGNOSIS — F329 Major depressive disorder, single episode, unspecified: Secondary | ICD-10-CM

## 2019-06-10 NOTE — Telephone Encounter (Signed)
Request Cymbalta

## 2019-07-08 ENCOUNTER — Other Ambulatory Visit: Payer: Self-pay | Admitting: Family Medicine

## 2019-07-08 DIAGNOSIS — F32A Depression, unspecified: Secondary | ICD-10-CM

## 2019-07-08 DIAGNOSIS — F329 Major depressive disorder, single episode, unspecified: Secondary | ICD-10-CM

## 2019-12-04 ENCOUNTER — Other Ambulatory Visit: Payer: Self-pay | Admitting: Family

## 2019-12-04 DIAGNOSIS — M5440 Lumbago with sciatica, unspecified side: Secondary | ICD-10-CM

## 2019-12-13 ENCOUNTER — Other Ambulatory Visit: Payer: Medicare Other

## 2019-12-27 ENCOUNTER — Ambulatory Visit
Admission: RE | Admit: 2019-12-27 | Discharge: 2019-12-27 | Disposition: A | Discharge status: Home / Self Care | Payer: Medicare Other | Source: Ambulatory Visit | Attending: Family | Admitting: Family

## 2019-12-27 ENCOUNTER — Other Ambulatory Visit: Payer: Self-pay

## 2019-12-27 DIAGNOSIS — M5440 Lumbago with sciatica, unspecified side: Secondary | ICD-10-CM

## 2020-01-07 ENCOUNTER — Other Ambulatory Visit: Payer: Medicare Other

## 2020-01-13 ENCOUNTER — Ambulatory Visit: Payer: Medicare Other | Attending: Internal Medicine

## 2020-01-13 DIAGNOSIS — Z23 Encounter for immunization: Secondary | ICD-10-CM

## 2020-01-13 NOTE — Progress Notes (Signed)
   Covid-19 Vaccination Clinic  Name:  Cindy Baker    MRN: [id number] DOB: 22-Jun-1965  01/13/2020  [name] was observed post Covid-19 immunization for 15 minutes without incident. She was provided with Vaccine Information Sheet and instruction to access the V-Safe system.   [name] was instructed to call 911 with any severe reactions post vaccine: Marland Kitchen Difficulty breathing  . Swelling of face and throat  . A fast heartbeat  . A bad rash all over body  . Dizziness and weakness   Immunizations Administered    Name Date Dose VIS Date Route   Pfizer COVID-19 Vaccine 01/13/2020  9:22 AM 0.3 mL 08/14/2018 Intramuscular   Manufacturer: [place]   Lot: [id number]   [place]: [id number]

## 2020-01-24 ENCOUNTER — Other Ambulatory Visit: Payer: Self-pay

## 2020-01-24 DIAGNOSIS — I70209 Unspecified atherosclerosis of native arteries of extremities, unspecified extremity: Secondary | ICD-10-CM

## 2020-01-27 ENCOUNTER — Other Ambulatory Visit: Payer: Self-pay | Admitting: Family Medicine

## 2020-01-27 DIAGNOSIS — E119 Type 2 diabetes mellitus without complications: Secondary | ICD-10-CM

## 2020-02-04 ENCOUNTER — Ambulatory Visit: Payer: Self-pay

## 2020-02-07 ENCOUNTER — Other Ambulatory Visit: Payer: Self-pay | Admitting: Family Medicine

## 2020-02-07 DIAGNOSIS — E119 Type 2 diabetes mellitus without complications: Secondary | ICD-10-CM

## 2020-02-11 ENCOUNTER — Ambulatory Visit (INDEPENDENT_AMBULATORY_CARE_PROVIDER_SITE_OTHER): Payer: Medicare Other | Admitting: Vascular Surgery

## 2020-02-11 ENCOUNTER — Other Ambulatory Visit: Payer: Self-pay

## 2020-02-11 ENCOUNTER — Encounter: Payer: Self-pay | Admitting: Vascular Surgery

## 2020-02-11 ENCOUNTER — Ambulatory Visit (HOSPITAL_COMMUNITY)
Admission: RE | Admit: 2020-02-11 | Discharge: 2020-02-11 | Disposition: A | Discharge status: Home / Self Care | Payer: Medicare Other | Source: Ambulatory Visit | Attending: Vascular Surgery | Admitting: Vascular Surgery

## 2020-02-11 DIAGNOSIS — I70209 Unspecified atherosclerosis of native arteries of extremities, unspecified extremity: Secondary | ICD-10-CM

## 2020-02-11 DIAGNOSIS — I70211 Atherosclerosis of native arteries of extremities with intermittent claudication, right leg: Secondary | ICD-10-CM | POA: Diagnosis not present

## 2020-02-11 DIAGNOSIS — I70219 Atherosclerosis of native arteries of extremities with intermittent claudication, unspecified extremity: Secondary | ICD-10-CM | POA: Insufficient documentation

## 2020-02-11 MED ORDER — CILOSTAZOL 100 MG PO TABS
100.0000 mg | ORAL_TABLET | Freq: Two times a day (BID) | ORAL | 11 refills | Status: DC
Start: 2020-02-11 — End: 2020-08-18

## 2020-02-11 NOTE — Progress Notes (Signed)
Patient name: Cindy Baker MRN: [id number] DOB: Mar 24, 1966 Sex: female  REASON FOR CONSULT: Evaluate intermittent claudication of the lower extremities  HPI: Cindy Baker is a 54 y.o. female, with history of hyperlipidemia, diabetes, fibromyalgia that presents for evaluation of lower extremity claudication. Patient states she has had a very complex history including chronic lower back pain and has required lumbar decompression at L5-S1 back in 2014 with Dr. Tonita Cong.  Now has evidence fracture at L5 on recent MRI. She feels some of her problems have been ongoing since her back surgery with numbness in her legs. Over the last year to year and a half she has had pain in the back of her right calf whenever she walks long distances. She states this usually starts after walking half a mile. She is smoking about half a pack every day. She denies any previous lower extremity vascular interventions.  No rest pain.  No active tissue loss.   Past Medical History:  Diagnosis Date  . Abnormal Pap smear of cervix   . Adrenal nodule ([place])   . Allergy   . Anemia   . Anxiety   . Arthritis    osteoarthritis  . Chronic gum disease   . Depression   . Diabetes mellitus without complication ([place])   . ENDOMETRIOSIS 08/17/2006   Qualifier: Diagnosis of  By: Samara Snide    . Fibromyalgia   . H. pylori infection    hx of 1 yr ago   . Hyperlipidemia   . Hypertension    on no meds   . IBS (irritable bowel syndrome)   . Shortness of breath    related to fibromyalgia  . UTI (lower urinary tract infection)     Past Surgical History:  Procedure Laterality Date  . ADENOIDECTOMY    . DENTAL SURGERY    . LUMBAR LAMINECTOMY/DECOMPRESSION MICRODISCECTOMY  07/25/2012   Procedure: LUMBAR LAMINECTOMY/DECOMPRESSION MICRODISCECTOMY 1 LEVEL;  Surgeon: Johnn Hai, MD;  Location: [facility];  Service: Orthopedics;  Laterality: N/A;  MICRODISCECTOMY L5-S1, FORAMINOTOMY L5-S1  . TUBAL LIGATION    . TYMPANOPLASTY       Family History  Problem Relation Age of Onset  . Cancer Paternal Aunt        cervical  . Cancer Maternal Grandmother        breast  . Cancer Paternal Grandfather        lung  . Lung cancer Father   . Esophageal cancer Other   . Colon cancer Neg Hx   . Colon polyps Neg Hx   . Rectal cancer Neg Hx   . Stomach cancer Neg Hx     SOCIAL HISTORY: Social History   Socioeconomic History  . Marital status: Divorced    Spouse name: Not on file  . Number of children: Not on file  . Years of education: Not on file  . Highest education level: Not on file  Occupational History  . Not on file  Tobacco Use  . Smoking status: Current Every Day Smoker    Packs/day: 0.50    Years: 13.00    Pack years: 6.50    Types: Cigarettes  . Smokeless tobacco: Never Used  Vaping Use  . Vaping Use: Former  Substance and Sexual Activity  . Alcohol use: Yes    Comment: occassionally beer  . Drug use: No    Comment: hx of pills and marijuana  . Sexual activity: Yes    Birth control/protection: Surgical  Other  Topics Concern  . Not on file  Social History Narrative  . Not on file   Social Determinants of Health   Financial Resource Strain:   . Difficulty of Paying Living Expenses: Not on file  Food Insecurity:   . Worried About Charity fundraiser in the Last Year: Not on file  . Ran Out of Food in the Last Year: Not on file  Transportation Needs:   . Lack of Transportation (Medical): Not on file  . Lack of Transportation (Non-Medical): Not on file  Physical Activity:   . Days of Exercise per Week: Not on file  . Minutes of Exercise per Session: Not on file  Stress:   . Feeling of Stress : Not on file  Social Connections:   . Frequency of Communication with Friends and Family: Not on file  . Frequency of Social Gatherings with Friends and Family: Not on file  . Attends Religious Services: Not on file  . Active Member of Clubs or Organizations: Not on file  . Attends Theatre manager Meetings: Not on file  . Marital Status: Not on file  Intimate Partner Violence:   . Fear of Current or Ex-Partner: Not on file  . Emotionally Abused: Not on file  . Physically Abused: Not on file  . Sexually Abused: Not on file    Allergies  Allergen Reactions  . Neomycin Other (See Comments)    Eyes swelling    Current Outpatient Medications  Medication Sig Dispense Refill  . albuterol (VENTOLIN HFA) 108 (90 Base) MCG/ACT inhaler Inhale 2 puffs into the lungs every 4 (four) hours as needed for wheezing or shortness of breath. 17 g 2  . B-D UF III MINI PEN NEEDLES 31G X 5 MM MISC U BID UTD    . Blood Glucose Monitoring Suppl (ACCU-CHEK AVIVA CONNECT) w/Device KIT 1 each by Does not apply route 4 (four) times daily -  before meals and at bedtime. 1 kit 0  . buPROPion (WELLBUTRIN SR) 150 MG 12 hr tablet Take 1 tablet (150 mg total) by mouth 2 (two) times daily. Take 150 mg for 3 days. Increase to 150 mg twice daily 60 tablet 5  . diclofenac sodium (VOLTAREN) 1 % GEL Apply 4 g topically 4 (four) times daily as needed (for pain). 100 g 0  . fluticasone (FLONASE) 50 MCG/ACT nasal spray Place 2 sprays into both nostrils daily. 16 g 6  . gabapentin (NEURONTIN) 300 MG capsule Take 1 capsule (300 mg total) by mouth 3 (three) times daily. 90 capsule 5  . gemfibrozil (LOPID) 600 MG tablet Take 1 tablet (600 mg total) by mouth 2 (two) times daily before a meal. 60 tablet 5  . glipiZIDE (GLUCOTROL) 10 MG tablet     . glucose blood (ACCU-CHEK AVIVA) test strip Three times per day prior to meals and at bedtime. Use as instructed 100 each 12  . hydrOXYzine (ATARAX/VISTARIL) 50 MG tablet Take 50-100 mg by mouth at bedtime.    . Lancets (ACCU-CHEK SOFT TOUCH) lancets Use as instructed 100 each 12  . levocetirizine (XYZAL) 5 MG tablet Take 1 tablet (5 mg total) by mouth every evening. 30 tablet 2  . methocarbamol (ROBAXIN) 500 MG tablet Take 1 tablet (500 mg total) by mouth 2 (two) times  daily as needed for muscle spasms. 20 tablet 0  . Multiple Vitamin (MULTIVITAMIN WITH MINERALS) TABS Take 1 tablet by mouth daily.    . Omega-3 Fatty Acids (FISH OIL) 1200  MG CAPS Take 1 capsule by mouth daily.    . sitaGLIPtin-metformin (JANUMET) 50-1000 MG tablet Take 1 tablet by mouth 2 (two) times daily with a meal. 60 tablet 5  . vitamin C (ASCORBIC ACID) 500 MG tablet Take 500 mg by mouth daily.    . B-D UF III MINI PEN NEEDLES 31G X 5 MM MISC USE TWICE DAILY AS DIRECTED 100 each 3  . cholecalciferol (VITAMIN D) 1000 units tablet Take 1,000 Units by mouth daily. (Patient not taking: Reported on 02/11/2020)    . DULoxetine (CYMBALTA) 30 MG capsule TAKE 1 CAPSULE(30 MG) BY MOUTH DAILY BEFORE BREAKFAST 30 capsule 5  . Exenatide ER (BYDUREON) 2 MG PEN Inject 2 mg into the skin once a week. 4 each 5  . omeprazole (PRILOSEC) 20 MG capsule TAKE 1 CAPSULE(20 MG) BY MOUTH TWICE DAILY BEFORE A MEAL FOR 14 DAYS (Patient not taking: Reported on 06/04/2018) 30 capsule 0   No current facility-administered medications for this visit.    REVIEW OF SYSTEMS:  _0  denotes positive finding, _1  denotes negative finding Cardiac  Comments:  Chest pain or chest pressure:    Shortness of breath upon exertion:    Short of breath when lying flat:    Irregular heart rhythm:        Vascular    Pain in calf, thigh, or hip brought on by ambulation: x Right worse  Pain in feet at night that wakes you up from your sleep:     Blood clot in your veins:    Leg swelling:         Pulmonary    Oxygen at home:    Productive cough:     Wheezing:         Neurologic    Sudden weakness in arms or legs:     Sudden numbness in arms or legs:     Sudden onset of difficulty speaking or slurred speech:    Temporary loss of vision in one eye:     Problems with dizziness:         Gastrointestinal    Blood in stool:     Vomited blood:         Genitourinary    Burning when urinating:     Blood in urine:         Psychiatric    Major depression:         Hematologic    Bleeding problems:    Problems with blood clotting too easily:        Skin    Rashes or ulcers:        Constitutional    Fever or chills:      PHYSICAL EXAM: Vitals:   02/11/20 1457  BP: 123/78  Pulse: 72  Resp: 16  Temp: 97.7 F (36.5 C)  TempSrc: Temporal  SpO2: 98%  Weight: 150 lb (68 kg)  Height: _2  (1.626 m)    GENERAL: The patient is a well-nourished female, in no acute distress. The vital signs are documented above. CARDIAC: There is a regular rate and rhythm.  VASCULAR:  Palpable femoral pulses both groins Left DP PT palpable Right PT palpable PULMONARY: There is good air exchange bilaterally without wheezing or rales. ABDOMEN: Soft and non-tender with normal pitched bowel sounds.  MUSCULOSKELETAL: There are no major deformities or cyanosis. NEUROLOGIC: No focal weakness or paresthesias are detected. SKIN: There are no ulcers or rashes noted. PSYCHIATRIC: The patient has a normal affect.  DATA:   ABIs today were 0.97 on the right monophasic and 0.96 on the left biphasic  Assessment/Plan:  54 year old female who presents with intermittent claudication worse in the right lower extremity. Discussed with her in detail that claudication is not critical limb ischemia and typically not considered a limb threatening situation. We discussed that if she is able to walk half a mile does not appear lifestyle limiting at this time. We discussed in the setting of nonlifestyle limiting claudication would favor medical management over any surgical or endovascular invention. I recommended that she start Pletal 100 mg twice daily.  Discussed walking therapies at least three times per week.  I want to see how she does with walking therapies and discussed walking at least 3 times a week. Some of her symptoms may be related to neurogenic issues which she understands given long issues with her back. I will plan to see her  back in 6 months with ABIs. Certainly no rest pain or tissue loss at this time to suggest critical limb ischemia or short distance claudication that would warrant intervention at this time.  She does have a palpable pulse at right ankle even through waveform abnormal.  She knows to call with questions or concerns.   Marty Heck, MD [facility] Office: [phone]

## 2020-02-13 ENCOUNTER — Other Ambulatory Visit: Payer: Self-pay | Admitting: Neurosurgery

## 2020-02-13 ENCOUNTER — Other Ambulatory Visit: Payer: Self-pay | Admitting: *Deleted

## 2020-02-13 DIAGNOSIS — I70209 Unspecified atherosclerosis of native arteries of extremities, unspecified extremity: Secondary | ICD-10-CM

## 2020-02-13 DIAGNOSIS — S32050A Wedge compression fracture of fifth lumbar vertebra, initial encounter for closed fracture: Secondary | ICD-10-CM

## 2020-02-17 ENCOUNTER — Other Ambulatory Visit: Payer: Self-pay | Admitting: Neurosurgery

## 2020-02-17 DIAGNOSIS — M4712 Other spondylosis with myelopathy, cervical region: Secondary | ICD-10-CM

## 2020-03-09 ENCOUNTER — Other Ambulatory Visit: Payer: Self-pay

## 2020-03-09 ENCOUNTER — Ambulatory Visit
Admission: RE | Admit: 2020-03-09 | Discharge: 2020-03-09 | Disposition: A | Discharge status: Home / Self Care | Payer: Medicare Other | Source: Ambulatory Visit | Attending: Neurosurgery | Admitting: Neurosurgery

## 2020-03-09 DIAGNOSIS — M4712 Other spondylosis with myelopathy, cervical region: Secondary | ICD-10-CM

## 2020-03-11 ENCOUNTER — Other Ambulatory Visit: Payer: Self-pay | Admitting: Family

## 2020-03-11 DIAGNOSIS — Z1231 Encounter for screening mammogram for malignant neoplasm of breast: Secondary | ICD-10-CM

## 2020-03-31 ENCOUNTER — Ambulatory Visit
Admission: RE | Admit: 2020-03-31 | Discharge: 2020-03-31 | Disposition: A | Discharge status: Home / Self Care | Payer: Medicare Other | Source: Ambulatory Visit | Attending: Family | Admitting: Family

## 2020-03-31 ENCOUNTER — Other Ambulatory Visit: Payer: Self-pay

## 2020-03-31 DIAGNOSIS — Z1231 Encounter for screening mammogram for malignant neoplasm of breast: Secondary | ICD-10-CM

## 2020-04-23 ENCOUNTER — Ambulatory Visit
Admission: RE | Admit: 2020-04-23 | Discharge: 2020-04-23 | Disposition: A | Discharge status: Home / Self Care | Payer: Medicare Other | Source: Ambulatory Visit | Attending: Neurosurgery | Admitting: Neurosurgery

## 2020-04-23 ENCOUNTER — Other Ambulatory Visit: Payer: Self-pay

## 2020-04-23 DIAGNOSIS — S32050A Wedge compression fracture of fifth lumbar vertebra, initial encounter for closed fracture: Secondary | ICD-10-CM

## 2020-04-28 ENCOUNTER — Other Ambulatory Visit: Payer: Self-pay | Admitting: Family Medicine

## 2020-04-28 DIAGNOSIS — E7800 Pure hypercholesterolemia, unspecified: Secondary | ICD-10-CM

## 2020-04-29 ENCOUNTER — Ambulatory Visit: Payer: Medicare Other | Admitting: Obstetrics and Gynecology

## 2020-05-05 ENCOUNTER — Ambulatory Visit: Payer: Medicare Other | Admitting: Advanced Practice Midwife

## 2020-06-02 ENCOUNTER — Ambulatory Visit (INDEPENDENT_AMBULATORY_CARE_PROVIDER_SITE_OTHER): Payer: Medicare Other | Admitting: Advanced Practice Midwife

## 2020-06-02 ENCOUNTER — Encounter: Payer: Self-pay | Admitting: Advanced Practice Midwife

## 2020-06-02 ENCOUNTER — Other Ambulatory Visit (HOSPITAL_COMMUNITY)
Admission: RE | Admit: 2020-06-02 | Discharge: 2020-06-02 | Disposition: A | Discharge status: Home / Self Care | Payer: Medicare Other | Source: Ambulatory Visit | Attending: Obstetrics and Gynecology | Admitting: Obstetrics and Gynecology

## 2020-06-02 ENCOUNTER — Other Ambulatory Visit: Payer: Self-pay

## 2020-06-02 VITALS — BP 131/81 | HR 88 | Ht 64.0 in | Wt 161.0 lb

## 2020-06-02 DIAGNOSIS — Z124 Encounter for screening for malignant neoplasm of cervix: Secondary | ICD-10-CM | POA: Insufficient documentation

## 2020-06-02 DIAGNOSIS — R87810 Cervical high risk human papillomavirus (HPV) DNA test positive: Secondary | ICD-10-CM | POA: Diagnosis not present

## 2020-06-02 DIAGNOSIS — N951 Menopausal and female climacteric states: Secondary | ICD-10-CM | POA: Diagnosis not present

## 2020-06-02 DIAGNOSIS — R87618 Other abnormal cytological findings on specimens from cervix uteri: Secondary | ICD-10-CM

## 2020-06-02 DIAGNOSIS — R87620 Atypical squamous cells of undetermined significance on cytologic smear of vagina (ASC-US): Secondary | ICD-10-CM | POA: Diagnosis not present

## 2020-06-02 DIAGNOSIS — Z1151 Encounter for screening for human papillomavirus (HPV): Secondary | ICD-10-CM | POA: Insufficient documentation

## 2020-06-02 MED ORDER — OXYBUTYNIN CHLORIDE ER 5 MG PO TB24
5.0000 mg | ORAL_TABLET | Freq: Every day | ORAL | 11 refills | Status: DC
Start: 2020-06-02 — End: 2021-02-14

## 2020-06-02 NOTE — Progress Notes (Signed)
Subjective:     Cindy Baker is a 54 y.o. female here at [facility] for a routine exam.  Current complaints: hot flashes.  Personal health questionnaire reviewed: yes.  Do you have a primary care provider? yes Do you feel safe at home? yes  [place] Visit from 02/04/2019 in [place]  PHQ-2 Total Score 1      Risk factors for chronic health problems: Smoking: yes Alchohol/how much:  Pt BMI: There is no height or weight on file to calculate BMI.   Gynecologic History No LMP recorded (lmp unknown). Patient is postmenopausal. Contraception: post menopausal status Last Pap: 2020. Results were: abnormal with ASCUS and positive hpv, colposcopy was benign. Last mammogram: 2021. Results were: normal  Obstetric History OB History  Gravida Para Term Preterm AB Living  3 2 2   1 2   SAB IAB Ectopic Multiple Live Births    1          # Outcome Date GA Lbr Len/2nd Weight Sex Delivery Anes PTL Lv  3 IAB           2 Term           1 Term              The following portions of the patient's history were reviewed and updated as appropriate: allergies, current medications, past family history, past medical history, past social history, past surgical history and problem list.  Review of Systems Pertinent items noted in HPI and remainder of comprehensive ROS otherwise negative.    Objective:   LMP  (LMP Unknown)  VS reviewed, nursing note reviewed,  Constitutional: well developed, well nourished, no distress HEENT: normocephalic CV: normal rate Pulm/chest wall: normal effort Breast Exam:  Deferred, performed by PCP in 2021 Abdomen: soft Neuro: alert and oriented x 3 Skin: warm, dry Psych: affect normal Pelvic exam: Performed: Cervix pink, visually closed, without lesion, scant white creamy discharge, vaginal walls and external genitalia normal Bimanual exam: Cervix 0/long/high, firm, anterior, neg CMT, uterus nontender, nonenlarged, adnexa  without tenderness, enlargement, or mass       Assessment/Plan:   1. Screening for cervical cancer  - Cytology - PAP( [place])  2. Vasomotor symptoms due to menopause --Pt with hot flashes waking her up and affecting her sleep, making her less functional during the day --Pt does not desire, nor is she a good candidate for HRT as a smoker with comorbidities. --Pt is taking Wellbutrin and gabapentin for other conditions, and neither are improving the hot flashes.  --Consider changing Wellbutrin (bupropion) to SNRI like Effexor (venlafaxine), pt will review this with her PCP --Rx for Ditropan XR 5-10 mg Q HS. Some evidence for efficacy for nonhormonal tx for hot flashes per up-to-date.  3. Abnormal Papanicolaou smear of cervix with positive human papilloma virus (HPV) test --Abnormal Pap with positive hpv in 2020 followed by colposcopy, 01/2019.  Colpo with benign findings.   --Repeat Pap with cotesting today.     Follow up in 1 year or PRN based on Pap results   Fatima Blank, CNM 8:31 AM

## 2020-06-02 NOTE — Patient Instructions (Signed)
Menopause Menopause is the normal time of life when menstrual periods stop completely. It is usually confirmed by 12 months without a menstrual period. The transition to menopause (perimenopause) most often happens between the ages of 45 and 55. During perimenopause, hormone levels change in your body, which can cause symptoms and affect your health. Menopause may increase your risk for:  Loss of bone (osteoporosis), which causes bone breaks (fractures).  Depression.  Hardening and narrowing of the arteries (atherosclerosis), which can cause heart attacks and strokes. What are the causes? This condition is usually caused by a natural change in hormone levels that happens as you get older. The condition may also be caused by surgery to remove both ovaries (bilateral oophorectomy). What increases the risk? This condition is more likely to start at an earlier age if you have certain medical conditions or treatments, including:  A tumor of the pituitary gland in the brain.  A disease that affects the ovaries and hormone production.  Radiation treatment for cancer.  Certain cancer treatments, such as chemotherapy or hormone (anti-estrogen) therapy.  Heavy smoking and excessive alcohol use.  Family history of early menopause. This condition is also more likely to develop earlier in women who are very thin. What are the signs or symptoms? Symptoms of this condition include:  Hot flashes.  Irregular menstrual periods.  Night sweats.  Changes in feelings about sex. This could be a decrease in sex drive or an increased comfort around your sexuality.  Vaginal dryness and thinning of the vaginal walls. This may cause painful intercourse.  Dryness of the skin and development of wrinkles.  Headaches.  Problems sleeping (insomnia).  Mood swings or irritability.  Memory problems.  Weight gain.  Hair growth on the face and chest.  Bladder infections or problems with urinating. How  is this diagnosed? This condition is diagnosed based on your medical history, a physical exam, your age, your menstrual history, and your symptoms. Hormone tests may also be done. How is this treated? In some cases, no treatment is needed. You and your health care provider should make a decision together about whether treatment is necessary. Treatment will be based on your individual condition and preferences. Treatment for this condition focuses on managing symptoms. Treatment may include:  Menopausal hormone therapy (MHT).  Medicines to treat specific symptoms or complications.  Acupuncture.  Vitamin or herbal supplements. Before starting treatment, make sure to let your health care provider know if you have a personal or family history of:  Heart disease.  Breast cancer.  Blood clots.  Diabetes.  Osteoporosis. Follow these instructions at home: Lifestyle  Do not use any products that contain nicotine or tobacco, such as cigarettes and e-cigarettes. If you need help quitting, ask your health care provider.  Get at least 30 minutes of physical activity on 5 or more days each week.  Avoid alcoholic and caffeinated beverages, as well as spicy foods. This may help prevent hot flashes.  Get 7-8 hours of sleep each night.  If you have hot flashes, try: ? Dressing in layers. ? Avoiding things that may trigger hot flashes, such as spicy food, warm places, or stress. ? Taking slow, deep breaths when a hot flash starts. ? Keeping a fan in your home and office.  Find ways to manage stress, such as deep breathing, meditation, or journaling.  Consider going to group therapy with other women who are having menopause symptoms. Ask your health care provider about recommended group therapy meetings. Eating and   drinking  Eat a healthy, balanced diet that contains whole grains, lean protein, low-fat dairy, and plenty of fruits and vegetables.  Your health care provider may recommend  adding more soy to your diet. Foods that contain soy include tofu, tempeh, and soy milk.  Eat plenty of foods that contain calcium and vitamin D for bone health. Items that are rich in calcium include low-fat milk, yogurt, beans, almonds, sardines, broccoli, and kale. Medicines  Take over-the-counter and prescription medicines only as told by your health care provider.  Talk with your health care provider before starting any herbal supplements. If prescribed, take vitamins and supplements as told by your health care provider. These may include: ? Calcium. Women age 51 and older should get 1,200 mg (milligrams) of calcium every day. ? Vitamin D. Women need 600-800 International Units of vitamin D each day. ? Vitamins B12 and B6. Aim for 50 micrograms of B12 and 1.5 mg of B6 each day. General instructions  Keep track of your menstrual periods, including: ? When they occur. ? How heavy they are and how long they last. ? How much time passes between periods.  Keep track of your symptoms, noting when they start, how often you have them, and how long they last.  Use vaginal lubricants or moisturizers to help with vaginal dryness and improve comfort during sex.  Keep all follow-up visits as told by your health care provider. This is important. This includes any group therapy or counseling. Contact a health care provider if:  You are still having menstrual periods after age 55.  You have pain during sex.  You have not had a period for 12 months and you develop vaginal bleeding. Get help right away if:  You have: ? Severe depression. ? Excessive vaginal bleeding. ? Pain when you urinate. ? A fast or irregular heart beat (palpitations). ? Severe headaches. ? Abdomen (abdominal) pain or severe indigestion.  You fell and you think you have a broken bone.  You develop leg or chest pain.  You develop vision problems.  You feel a lump in your breast. Summary  Menopause is the normal  time of life when menstrual periods stop completely. It is usually confirmed by 12 months without a menstrual period.  The transition to menopause (perimenopause) most often happens between the ages of 45 and 55.  Symptoms can be managed through medicines, lifestyle changes, and complementary therapies such as acupuncture.  Eat a balanced diet that is rich in nutrients to promote bone health and heart health and to manage symptoms during menopause. This information is not intended to replace advice given to you by your health care provider. Make sure you discuss any questions you have with your health care provider. Document Revised: 05/19/2017 Document Reviewed: 07/09/2016 Elsevier Patient Education  2020 [employer]  

## 2020-06-02 NOTE — Progress Notes (Signed)
Patient presents for Annual Exam  Mammogram:03/31/20 Repeat in 1 yr   Last pap: 02/04/19 ASCUS  + HPV   Colonoscopy : Done at age 54 1 polyp was removed.   Seeing [facility] for PT due to urinary incontinence.   CC: None

## 2020-06-05 LAB — CYTOLOGY - PAP
Comment: NEGATIVE
Comment: NEGATIVE
Diagnosis: UNDETERMINED — AB
HPV 16: NEGATIVE
HPV 18 / 45: NEGATIVE
High risk HPV: POSITIVE — AB

## 2020-06-15 ENCOUNTER — Other Ambulatory Visit: Payer: Self-pay | Admitting: Physician Assistant

## 2020-06-15 DIAGNOSIS — R1013 Epigastric pain: Secondary | ICD-10-CM

## 2020-07-07 ENCOUNTER — Other Ambulatory Visit: Payer: Medicare Other

## 2020-07-27 ENCOUNTER — Ambulatory Visit
Admission: RE | Admit: 2020-07-27 | Discharge: 2020-07-27 | Disposition: A | Discharge status: Home / Self Care | Payer: Medicare Other | Source: Ambulatory Visit | Attending: Physician Assistant | Admitting: Physician Assistant

## 2020-07-27 DIAGNOSIS — R1013 Epigastric pain: Secondary | ICD-10-CM

## 2020-07-31 ENCOUNTER — Other Ambulatory Visit (HOSPITAL_COMMUNITY): Payer: Self-pay | Admitting: Physician Assistant

## 2020-07-31 DIAGNOSIS — R112 Nausea with vomiting, unspecified: Secondary | ICD-10-CM

## 2020-08-17 ENCOUNTER — Encounter (HOSPITAL_COMMUNITY)
Admission: RE | Admit: 2020-08-17 | Discharge: 2020-08-17 | Disposition: A | Discharge status: Home / Self Care | Payer: Medicare Other | Source: Ambulatory Visit | Attending: Physician Assistant | Admitting: Physician Assistant

## 2020-08-17 ENCOUNTER — Other Ambulatory Visit: Payer: Self-pay

## 2020-08-17 DIAGNOSIS — R112 Nausea with vomiting, unspecified: Secondary | ICD-10-CM | POA: Diagnosis present

## 2020-08-17 MED ORDER — TECHNETIUM TC 99M MEBROFENIN IV KIT
5.2000 | PACK | Freq: Once | INTRAVENOUS | Status: AC | PRN
  Administered 2020-08-17: 5.2 via INTRAVENOUS

## 2020-08-18 ENCOUNTER — Ambulatory Visit (INDEPENDENT_AMBULATORY_CARE_PROVIDER_SITE_OTHER): Payer: Medicare Other | Admitting: Vascular Surgery

## 2020-08-18 ENCOUNTER — Encounter: Payer: Self-pay | Admitting: Vascular Surgery

## 2020-08-18 ENCOUNTER — Ambulatory Visit (HOSPITAL_COMMUNITY)
Admission: RE | Admit: 2020-08-18 | Discharge: 2020-08-18 | Disposition: A | Discharge status: Home / Self Care | Payer: Medicare Other | Source: Ambulatory Visit | Attending: Vascular Surgery | Admitting: Vascular Surgery

## 2020-08-18 ENCOUNTER — Other Ambulatory Visit: Payer: Self-pay

## 2020-08-18 VITALS — BP 111/73 | HR 90 | Temp 99.0°F | Resp 16 | Ht 64.0 in | Wt 160.0 lb

## 2020-08-18 DIAGNOSIS — I70211 Atherosclerosis of native arteries of extremities with intermittent claudication, right leg: Secondary | ICD-10-CM | POA: Diagnosis not present

## 2020-08-18 DIAGNOSIS — I70209 Unspecified atherosclerosis of native arteries of extremities, unspecified extremity: Secondary | ICD-10-CM | POA: Diagnosis not present

## 2020-08-18 MED ORDER — CILOSTAZOL 100 MG PO TABS: 100.0000 mg | ORAL_TABLET | Freq: Two times a day (BID) | ORAL | 11 refills | Status: DC

## 2020-08-18 NOTE — Progress Notes (Signed)
Patient name: Cindy Baker MRN: [id number] DOB: 04-Dec-1965 Sex: female  REASON FOR CONSULT: 6 month follow-up for intermittent claudication of the lower extremities  HPI: Cindy Baker is a 55 y.o. female, with history of hyperlipidemia, diabetes, fibromyalgia that presents for 6 month follow-up of lower extremity claudication. Patient states she has had a very complex history including chronic lower back pain and has required lumbar decompression at L5-S1 back in 2014 with Dr. Shelle Iron.  She feels some of her problems have been ongoing since her back surgery with numbness in her right leg. I initially saw her with right calf claudication after half a mile.  I did start her on cilostazol with recommendation for medical management.  She does feel that her right calf cramping is improved.  She is now able to walk around the store multiple laps without stopping.  Is smoking but has cut back to a pack every 3 days.  Past Medical History:  Diagnosis Date  . Abnormal Pap smear of cervix   . Adrenal nodule (HCC)   . Allergy   . Anemia   . Anxiety   . Arthritis    osteoarthritis  . Chronic gum disease   . Depression   . Diabetes mellitus without complication (HCC)   . ENDOMETRIOSIS 08/17/2006   Qualifier: Diagnosis of  By: Knox Royalty    . Fibromyalgia   . H. pylori infection    hx of 1 yr ago   . Hyperlipidemia   . Hypertension    on no meds   . IBS (irritable bowel syndrome)   . Shortness of breath    related to fibromyalgia  . UTI (lower urinary tract infection)     Past Surgical History:  Procedure Laterality Date  . ADENOIDECTOMY    . DENTAL SURGERY    . LUMBAR LAMINECTOMY/DECOMPRESSION MICRODISCECTOMY  07/25/2012   Procedure: LUMBAR LAMINECTOMY/DECOMPRESSION MICRODISCECTOMY 1 LEVEL;  Surgeon: Javier Docker, MD;  Location: [facility];  Service: Orthopedics;  Laterality: N/A;  MICRODISCECTOMY L5-S1, FORAMINOTOMY L5-S1  . TUBAL LIGATION    . TYMPANOPLASTY      Family  History  Problem Relation Age of Onset  . Cancer Paternal Aunt        cervical  . Cancer Maternal Grandmother        breast  . Cancer Paternal Grandfather        lung  . Lung cancer Father   . Esophageal cancer Other   . Colon cancer Neg Hx   . Colon polyps Neg Hx   . Rectal cancer Neg Hx   . Stomach cancer Neg Hx     SOCIAL HISTORY: Social History   Socioeconomic History  . Marital status: Divorced    Spouse name: Not on file  . Number of children: Not on file  . Years of education: Not on file  . Highest education level: Not on file  Occupational History  . Not on file  Tobacco Use  . Smoking status: Current Every Day Smoker    Packs/day: 0.50    Years: 13.00    Pack years: 6.50    Types: Cigarettes  . Smokeless tobacco: Never Used  Vaping Use  . Vaping Use: Former  Substance and Sexual Activity  . Alcohol use: Yes    Comment: occassionally beer  . Drug use: No    Comment: hx of pills and marijuana  . Sexual activity: Not Currently    Birth control/protection: Surgical  Other Topics Concern  .  Not on file  Social History Narrative  . Not on file   Social Determinants of Health   Financial Resource Strain: Not on file  Food Insecurity: Not on file  Transportation Needs: Not on file  Physical Activity: Not on file  Stress: Not on file  Social Connections: Not on file  Intimate Partner Violence: Not on file    Allergies  Allergen Reactions  . Neomycin Other (See Comments)    Eyes swelling  . Other Other (See Comments)    Current Outpatient Medications  Medication Sig Dispense Refill  . albuterol (VENTOLIN HFA) 108 (90 Base) MCG/ACT inhaler Inhale 2 puffs into the lungs every 4 (four) hours as needed for wheezing or shortness of breath. 17 g 2  . B-D UF III MINI PEN NEEDLES 31G X 5 MM MISC U BID UTD    . Blood Glucose Monitoring Suppl (ACCU-CHEK AVIVA CONNECT) w/Device KIT 1 each by Does not apply route 4 (four) times daily -  before meals and at  bedtime. 1 kit 0  . buPROPion (WELLBUTRIN SR) 150 MG 12 hr tablet Take 1 tablet (150 mg total) by mouth 2 (two) times daily. Take 150 mg for 3 days. Increase to 150 mg twice daily 60 tablet 5  . cholecalciferol (VITAMIN D) 1000 units tablet Take 1,000 Units by mouth daily.    . cilostazol (PLETAL) 100 MG tablet Take 1 tablet (100 mg total) by mouth 2 (two) times daily before a meal. 60 tablet 11  . diclofenac sodium (VOLTAREN) 1 % GEL Apply 4 g topically 4 (four) times daily as needed (for pain). 100 g 0  . fluticasone (FLONASE) 50 MCG/ACT nasal spray Place 2 sprays into both nostrils daily. 16 g 6  . gabapentin (NEURONTIN) 300 MG capsule Take 1 capsule (300 mg total) by mouth 3 (three) times daily. 90 capsule 5  . glipiZIDE (GLUCOTROL) 10 MG tablet     . glucose blood (ACCU-CHEK AVIVA) test strip Three times per day prior to meals and at bedtime. Use as instructed 100 each 12  . hydrOXYzine (ATARAX/VISTARIL) 50 MG tablet Take 50-100 mg by mouth at bedtime.    . Lancets (ACCU-CHEK SOFT TOUCH) lancets Use as instructed 100 each 12  . losartan (COZAAR) 25 MG tablet Take 25 mg by mouth daily.    . methocarbamol (ROBAXIN) 500 MG tablet Take 1 tablet (500 mg total) by mouth 2 (two) times daily as needed for muscle spasms. 20 tablet 0  . Multiple Vitamin (MULTIVITAMIN WITH MINERALS) TABS Take 1 tablet by mouth daily.    Marland Kitchen oxybutynin (DITROPAN XL) 5 MG 24 hr tablet Take 1-2 tablets (5-10 mg total) by mouth at bedtime. 30 tablet 11  . sitaGLIPtin-metformin (JANUMET) 50-1000 MG tablet Take 1 tablet by mouth 2 (two) times daily with a meal. 60 tablet 5  . vitamin C (ASCORBIC ACID) 500 MG tablet Take 500 mg by mouth daily.    . B-D UF III MINI PEN NEEDLES 31G X 5 MM MISC USE TWICE DAILY AS DIRECTED 100 each 3  . DULoxetine (CYMBALTA) 30 MG capsule TAKE 1 CAPSULE(30 MG) BY MOUTH DAILY BEFORE BREAKFAST 30 capsule 5  . Exenatide ER (BYDUREON) 2 MG PEN Inject 2 mg into the skin once a week. 4 each 5  .  gemfibrozil (LOPID) 600 MG tablet Take 1 tablet (600 mg total) by mouth 2 (two) times daily before a meal. (Patient not taking: No sig reported) 60 tablet 5  . levocetirizine (XYZAL) 5  MG tablet Take 1 tablet (5 mg total) by mouth every evening. (Patient not taking: No sig reported) 30 tablet 2  . Omega-3 Fatty Acids (FISH OIL) 1200 MG CAPS Take 1 capsule by mouth daily. (Patient not taking: No sig reported)    . omeprazole (PRILOSEC) 20 MG capsule TAKE 1 CAPSULE(20 MG) BY MOUTH TWICE DAILY BEFORE A MEAL FOR 14 DAYS (Patient not taking: No sig reported) 30 capsule 0   No current facility-administered medications for this visit.    REVIEW OF SYSTEMS:  $RemoveB'[X]'rmhgQBUy$  denotes positive finding, $RemoveBeforeDEI'[ ]'UdnMYjONWWNmeZEf$  denotes negative finding Cardiac  Comments:  Chest pain or chest pressure:    Shortness of breath upon exertion:    Short of breath when lying flat:    Irregular heart rhythm:        Vascular    Pain in calf, thigh, or hip brought on by ambulation:    Pain in feet at night that wakes you up from your sleep:     Blood clot in your veins:    Leg swelling:         Pulmonary    Oxygen at home:    Productive cough:     Wheezing:         Neurologic    Sudden weakness in arms or legs:     Sudden numbness in arms or legs:     Sudden onset of difficulty speaking or slurred speech:    Temporary loss of vision in one eye:     Problems with dizziness:         Gastrointestinal    Blood in stool:     Vomited blood:         Genitourinary    Burning when urinating:     Blood in urine:        Psychiatric    Major depression:         Hematologic    Bleeding problems:    Problems with blood clotting too easily:        Skin    Rashes or ulcers:        Constitutional    Fever or chills:      PHYSICAL EXAM: Vitals:   08/18/20 1145  BP: 111/73  Pulse: 90  Resp: 16  Temp: 99 F (37.2 C)  TempSrc: Temporal  SpO2: 94%  Weight: 160 lb (72.6 kg)  Height: $Remove'5\' 4"'LmcThEN$  (1.626 m)    GENERAL: The patient  is a well-nourished female, in no acute distress. The vital signs are documented above. CARDIAC: There is a regular rate and rhythm.  VASCULAR:  Palpable femoral pulses both groins Left  PT palpable Right PT palpable Feet are warm with no ulcerations PULMONARY: No respiratory distress. ABDOMEN: Soft and non-tender. MUSCULOSKELETAL: There are no major deformities or cyanosis. NEUROLOGIC: No focal weakness or paresthesias are detected. SKIN: There are no ulcers or rashes noted. PSYCHIATRIC: The patient has a normal affect.  DATA:   ABIs today were 0.99 on the right biphasic and 1.01 on the left biphasic  Assessment/Plan:  55 year old female who presents for 6 month follow-up of intermittent claudication in the right lower extremity.  Fortunately with medical management she has had significant improvement.  She denies any further calf cramping in the right leg when walking.  She is tolerating the Pletal and does feel that this has helped.  I again reiterated medical management with no indication for surgical intervention.  I did refill her Pletal today.  We will  see her back in 1 year with ABIs in the [facility] clinic for surveillance.  She knows to call with questions or concerns.  I did talk to her about the importance of smoking cessation.  Marty Heck, MD [facility] Office: [phone]

## 2020-10-06 ENCOUNTER — Ambulatory Visit: Payer: Self-pay | Admitting: Surgery

## 2020-10-07 ENCOUNTER — Other Ambulatory Visit: Payer: Self-pay | Admitting: Gastroenterology

## 2020-10-07 ENCOUNTER — Other Ambulatory Visit (HOSPITAL_COMMUNITY): Payer: Self-pay | Admitting: Gastroenterology

## 2020-10-07 DIAGNOSIS — R1013 Epigastric pain: Secondary | ICD-10-CM

## 2020-10-22 NOTE — Progress Notes (Addendum)
COVID Vaccine Completed: x2 Date COVID Vaccine completed:  01-13-20, 02-05-21 Has received booster:   COVID vaccine manufacturer: Pfizer    Date of COVID positive in last 90 days:  N/A  PCP - Dustin Folks, FNP Cardiologist - N/A  Chest x-ray - N/A EKG - 10-26-20 Epic Stress Test - N/A ECHO - N/A Cardiac Cath - N/A Pacemaker/ICD device last checked: Spinal Cord Stimulator:  Sleep Study - N/A CPAP -   Fasting Blood Sugar - 70 to 150 Checks Blood Sugar - 2  times a day  Blood Thinner Instructions:  Pletal 100 mg.  Pt to call and check on this today.  I called and spoke to [facility] at Dr. Gala Lewandowsky office to make aware that patient does not have instructions on when to stop Pletal. Aspirin Instructions: Last Dose:  Activity level:  Can go up a flight of stairs and perform activities of daily living without stopping and without symptoms of chest pain or shortness of breath.      Pt states that with her fibromyalgia she can have SOB with extreme exertional activity but able to perform all ADLs and climb stairs without SOB.  Anesthesia review: N/A  Patient denies shortness of breath, fever, cough and chest pain at PAT appointment   Patient verbalized understanding of instructions that were given to them at the PAT appointment. Patient was also instructed that they will need to review over the PAT instructions again at home before surgery.

## 2020-10-22 NOTE — Patient Instructions (Addendum)
DUE TO COVID-19 ONLY ONE VISITOR IS ALLOWED TO COME WITH YOU AND STAY IN THE WAITING ROOM ONLY DURING PRE OP AND PROCEDURE.   **NO VISITORS ARE ALLOWED IN THE SHORT STAY AREA OR RECOVERY ROOM!!**    COVID SWAB TESTING MUST BE COMPLETED ON:  Friday, 10-30-20 @ 8:25 AM  [address] [place], [place] [address]  (Must self quarantine after testing. Follow instructions on handout.)        Your procedure is scheduled on: Monday, 11-02-20   Report to [facility] Main  Entrance    Report to admitting at 7:30 AM   Call this number if you have problems the morning of surgery [phone]   Do not eat food :After Midnight.   May have liquids until 6:30 AM day of surgery  CLEAR LIQUID DIET  Foods Allowed                                                                     Foods Excluded  Water, Black Coffee and tea, regular and decaf               liquids that you cannot  Plain Jell-O in any flavor  (No red)                                      see through such as: Fruit ices (not with fruit pulp)                                      milk, soups, orange juice              Iced Popsicles (No red)                                      All solid food                                   Apple juices Sports drinks like Gatorade (No red) Lightly seasoned clear broth or consume(fat free) Sugar, honey syrup   Oral Hygiene is also important to reduce your risk of infection.                                    Remember - BRUSH YOUR TEETH THE MORNING OF SURGERY WITH YOUR REGULAR TOOTHPASTE   Do NOT smoke after Midnight   Take these medicines the morning of surgery with A SIP OF WATER: Wellbutrin, Pepcid, Gabapentin, Gemfibrozil, Hydroxyzine,  Omeprazole, Pantroprazole.  Okay to use Albuterol inhaler and bring with you day of surgery  How to Manage Your Diabetes Before and After Surgery  Why is it important to control my blood sugar before and after surgery? . Improving blood sugar levels  before and after surgery helps healing and can limit problems. . A way of improving blood sugar control is  eating a healthy diet by: o  Eating less sugar and carbohydrates o  Increasing activity/exercise o  Talking with your doctor about reaching your blood sugar goals . High blood sugars (greater than 180 mg/dL) can raise your risk of infections and slow your recovery, so you will need to focus on controlling your diabetes during the weeks before surgery. . Make sure that the doctor who takes care of your diabetes knows about your planned surgery including the date and location.  How do I manage my blood sugar before surgery? . Check your blood sugar at least 4 times a day, starting 2 days before surgery, to make sure that the level is not too high or low. o Check your blood sugar the morning of your surgery when you wake up and every 2 hours until you get to the Short Stay unit. . If your blood sugar is less than 70 mg/dL, you will need to treat for low blood sugar: o Do not take insulin. o Treat a low blood sugar (less than 70 mg/dL) with  cup of clear juice (cranberry or apple), 4 glucose tablets, OR glucose gel. o Recheck blood sugar in 15 minutes after treatment (to make sure it is greater than 70 mg/dL). If your blood sugar is not greater than 70 mg/dL on recheck, call [phone] for further instructions. . Report your blood sugar to the short stay nurse when you get to Short Stay.  . If you are admitted to the hospital after surgery: o Your blood sugar will be checked by the staff and you will probably be given insulin after surgery (instead of oral diabetes medicines) to make sure you have good blood sugar levels. o The goal for blood sugar control after surgery is 80-180 mg/dL.   WHAT DO I DO ABOUT MY DIABETES MEDICATION?  Marland Kitchen Do not take oral diabetes medicines (pills) the morning of surgery.  . THE DAY BEFORE SURGERY:  Take Glipizide in the morning, no evening dose.                     Take Janumet as prescribe.       . THE MORNING OF SURGERY:  Do not take Glipizide or Jamumet.   Reviewed and Endorsed by [facility] Patient Education Committee, August 2015                               You may not have any metal on your body including hair pins, jewelry, and body piercings             Do not wear make-up, lotions, powders, perfumes/cologne, or deodorant             Do not wear nail polish.  Do not shave  48 hours prior to surgery.     Do not bring valuables to the hospital. [place].   Contacts, dentures or bridgework may not be worn into surgery.   Patients discharged the day of surgery will not be allowed to drive home.              Please read over the following fact sheets you were given: IF YOU HAVE QUESTIONS ABOUT YOUR PRE OP INSTRUCTIONS PLEASE CALL  [place] - Preparing for Surgery Before surgery, you can play an important role.  Because skin is not sterile, your skin needs to  be as free of germs as possible.  You can reduce the number of germs on your skin by washing with CHG (chlorahexidine gluconate) soap before surgery.  CHG is an antiseptic cleaner which kills germs and bonds with the skin to continue killing germs even after washing. Please DO NOT use if you have an allergy to CHG or antibacterial soaps.  If your skin becomes reddened/irritated stop using the CHG and inform your nurse when you arrive at Short Stay. Do not shave (including legs and underarms) for at least 48 hours prior to the first CHG shower.  You may shave your face/neck.  Please follow these instructions carefully:  1.  Shower with CHG Soap the night before surgery and the  morning of surgery.  2.  If you choose to wash your hair, wash your hair first as usual with your normal  shampoo.  3.  After you shampoo, rinse your hair and body thoroughly to remove the shampoo.                             4.  Use CHG as you  would any other liquid soap.  You can apply chg directly to the skin and wash.  Gently with a scrungie or clean washcloth.  5.  Apply the CHG Soap to your body ONLY FROM THE NECK DOWN.   Do   not use on face/ open                           Wound or open sores. Avoid contact with eyes, ears mouth and   genitals (private parts).                       Wash face,  Genitals (private parts) with your normal soap.             6.  Wash thoroughly, paying special attention to the area where your    surgery  will be performed.  7.  Thoroughly rinse your body with warm water from the neck down.  8.  DO NOT shower/wash with your normal soap after using and rinsing off the CHG Soap.                9.  Pat yourself dry with a clean towel.            10.  Wear clean pajamas.            11.  Place clean sheets on your bed the night of your first shower and do not  sleep with pets. Day of Surgery : Do not apply any lotions/deodorants the morning of surgery.  Please wear clean clothes to the hospital/surgery center.  FAILURE TO FOLLOW THESE INSTRUCTIONS MAY RESULT IN THE CANCELLATION OF YOUR SURGERY  PATIENT SIGNATURE_________________________________  NURSE SIGNATURE__________________________________  ________________________________________________________________________

## 2020-10-25 ENCOUNTER — Encounter (HOSPITAL_COMMUNITY): Payer: Self-pay | Admitting: Surgery

## 2020-10-25 DIAGNOSIS — K828 Other specified diseases of gallbladder: Secondary | ICD-10-CM | POA: Diagnosis present

## 2020-10-25 DIAGNOSIS — K8010 Calculus of gallbladder with chronic cholecystitis without obstruction: Secondary | ICD-10-CM | POA: Diagnosis present

## 2020-10-25 NOTE — H&P (Addendum)
General Surgery [facility]  [name] DOB: 1966/02/26 Widowed / Language: Cleophus Molt / Race: White Female   History of Present Illness  The patient is a 55 year old female who presents for evaluation of gall stones.  CHIEF COMPLAINT: abdominal pain, chronic cholecystitis, cholelithiasis, biliary dyskinesia  Patient is referred by Vicie Mutters, PA, for surgical evaluation and management of symptomatic cholelithiasis, chronic cholecystitis, and biliary dyskinesia. Patient has had a history of 4-5 years of gastrointestinal issues. This includes irritable bowel syndrome, chronic diarrhea, and abdominal pain. Recent evaluation included an upper endoscopy which may demonstrate evidence of gastroparesis. Patient had an ultrasound on July 27, 2020 which demonstrated intraluminal calculi measuring up to 9 mm in size. Also underwent a nuclear medicine hepatobiliary scan on August 17, 2020 which showed a low gallbladder ejection fraction of 20%. Based on these studies, the patient is referred to surgery for consideration for cholecystectomy. Patient does have a family history of gallbladder disease in her mother. She denies any previous hepatobiliary disease. She denies hepatitis or pancreatitis. She has no history of jaundice. Previous abdominal surgery includes a bilateral tubal ligation. Patient is on disability due to chronic back problems with multiple prior procedures.   Diagnostic Studies History  Colonoscopy  1-5 years ago  Allergies  Neomycin Sulfate (Topical) *DERMATOLOGICALS*  Allergies Reconciled   Medication History  HYDROcodone-Acetaminophen (10-325MG  Tablet, Oral) Active. traMADol HCl (50MG  Tablet, Oral) Active. Accu-Chek Aviva Plus (In Vitro) Active. Accu-Chek Guide (w/Device Kit,) Active. Accu-Chek Softclix Lancets Active. Amoxicillin-Pot Clavulanate (875-125MG  Tablet, Oral) Active. BD Pen Needle Mini U/F (31G X 5 MM Misc,)  Active. buPROPion HCl ER (SR) (150MG  Tablet ER 12HR, Oral) Active. Cilostazol (100MG  Tablet, Oral) Active. Colestipol HCl (1GM Tablet, Oral) Active. DULoxetine HCl (30MG  Capsule DR Part, Oral) Active. Famotidine (20MG  Tablet, Oral) Active. Fluticasone Propionate (50MCG/ACT Suspension, Nasal) Active. Gabapentin (600MG  Tablet, Oral) Active. Gemfibrozil (600MG  Tablet, Oral) Active. glipiZIDE (10MG  Tablet, Oral) Active. hydrOXYzine HCl (50MG  Tablet, Oral) Active. Janumet (50-1000MG  Tablet, Oral) Active. Ketorolac Tromethamine (10MG  Tablet, Oral) Active. Loratadine (10MG  Tablet, Oral) Active. Losartan Potassium (25MG  Tablet, Oral) Active. Methocarbamol (500MG  Tablet, Oral) Active. Pantoprazole Sodium (40MG  Tablet DR, Oral) Active. predniSONE (5MG  (48) Tab Ther Pack, Oral) Active. predniSONE (5MG  (48) Tab Ther Pack, Oral) Active. ProAir HFA (108 (90 Base)MCG/ACT Aerosol Soln, Inhalation) Active. Promethazine HCl (12.5MG  Tablet, Oral) Active. Rosuvastatin Calcium (20MG  Tablet, Oral) Active. traZODone HCl (100MG  Tablet, Oral) Active. Medications Reconciled  Social History  Alcohol use  Remotely quit alcohol use. Caffeine use  Carbonated beverages, Tea. Illicit drug use  Prefer to discuss with provider.  Family History  Alcohol Abuse  Daughter, Family Members In Advance, Sister. Arthritis  Family Members In General, Father, Mother. Bleeding disorder  Family Members In General. Breast Cancer  Family Members In General. Colon Polyps  Family Members In General. Diabetes Mellitus  Daughter. Hypertension  Sister. Ischemic Bowel Disease  Family Members In General. Migraine Headache  Daughter, Family Members In General. Ovarian Cancer  Family Members In General. Respiratory Condition  Family Members In General. Thyroid problems  Sister.  Pregnancy / Birth History  Age at menarche  64 years. Age of menopause  [age] Irregular periods  Length  (months) of breastfeeding  7-12 Para  2  Other Problems  Anxiety Disorder  Bladder Problems  Cholelithiasis  Depression  Vascular Disease   Review of Systems  General Present- Night Sweats. Not Present- Appetite Loss, Chills, Fatigue, Fever, Weight Gain and Weight Loss. HEENT Present- Wears  glasses/contact lenses. Not Present- Earache, Hearing Loss, Hoarseness, Nose Bleed, Oral Ulcers, Ringing in the Ears, Seasonal Allergies, Sinus Pain, Sore Throat, Visual Disturbances and Yellow Eyes. Breast Not Present- Breast Mass, Breast Pain, Nipple Discharge and Skin Changes. Cardiovascular Present- Shortness of Breath. Not Present- Chest Pain, Difficulty Breathing Lying Down, Leg Cramps, Palpitations, Rapid Heart Rate and Swelling of Extremities. Gastrointestinal Present- Abdominal Pain, Change in Bowel Habits and Nausea. Not Present- Bloating, Bloody Stool, Chronic diarrhea, Constipation, Difficulty Swallowing, Excessive gas, Gets full quickly at meals, Hemorrhoids, Indigestion, Rectal Pain and Vomiting. Female Genitourinary Present- Frequency and Urgency. Not Present- Nocturia, Painful Urination and Pelvic Pain. Musculoskeletal Present- Joint Pain and Muscle Pain. Not Present- Back Pain, Joint Stiffness, Muscle Weakness and Swelling of Extremities. Neurological Present- Numbness, Tingling and Tremor. Not Present- Decreased Memory, Fainting, Headaches, Seizures, Trouble walking and Weakness.  Vitals  Weight: 165.25 lb Height: 64in Body Surface Area: 1.8 m Body Mass Index: 28.36 kg/m  Temp.: 97.107F  Pulse: 102 (Regular)  P.OX: 96% (Room air) BP: 120/70(Sitting, Left Arm, Standard)  Physical Exam   GENERAL APPEARANCE Development: normal Nutritional status: normal Gross deformities: none  SKIN Rash, lesions, ulcers: none Induration, erythema: none Nodules: none palpable  EYES Conjunctiva and lids: normal Pupils: equal and reactive Iris: normal bilaterally  EARS,  NOSE, MOUTH, THROAT External ears: no lesion or deformity External nose: no lesion or deformity Hearing: grossly normal Due to Covid-19 pandemic, patient is wearing a mask.  NECK Symmetric: yes Trachea: midline Thyroid: no palpable nodules in the thyroid bed  CHEST Respiratory effort: normal Retraction or accessory muscle use: no Breath sounds: normal bilaterally Rales, rhonchi, wheeze: none  CARDIOVASCULAR Auscultation: regular rhythm, normal rate Murmurs: none Pulses: radial pulse 2+ palpable Lower extremity edema: none  ABDOMEN Distension: none Masses: none palpable Tenderness: Mild tenderness to palpation right upper quadrant without palpable mass Hepatosplenomegaly: not present Hernia: not present  MUSCULOSKELETAL Station and gait: normal Digits and nails: no clubbing or cyanosis Muscle strength: grossly normal all extremities Range of motion: grossly normal all extremities Deformity: none  LYMPHATIC Cervical: none palpable Supraclavicular: none palpable  PSYCHIATRIC Oriented to person, place, and time: yes Mood and affect: normal for situation Judgment and insight: appropriate for situation    Assessment & Plan   CHOLELITHIASIS WITH CHRONIC CHOLECYSTITIS (K80.10) BILIARY DYSKINESIA (K82.8)  Patient is referred by gastroenterology for surgical evaluation and management of chronic cholecystitis, cholelithiasis, and biliary dyskinesia. Patient is provided with written literature on laparoscopic gallbladder surgery to review at home.  The patient and I discussed the indications for laparoscopic cholecystectomy with intraoperative cholangiography. We discussed the risk and benefits of the procedure including the potential for conversion to open surgery. We discussed the size and location of the surgical incisions. We discussed the hospital stay to be anticipated. We discussed the postoperative recovery and return to normal activities. She understands and  wishes to proceed with surgery in the near future.  The risks and benefits of the procedure have been discussed at length with the patient. The patient understands the proposed procedure, potential alternative treatments, and the course of recovery to be expected. All of the patient's questions have been answered at this time. The patient wishes to proceed with surgery.  Armandina Gemma, MD [facility] Office: [phone]

## 2020-10-26 ENCOUNTER — Other Ambulatory Visit: Payer: Self-pay

## 2020-10-26 ENCOUNTER — Encounter (HOSPITAL_COMMUNITY)
Admission: RE | Admit: 2020-10-26 | Discharge: 2020-10-26 | Disposition: A | Discharge status: Home / Self Care | Payer: Medicare Other | Source: Ambulatory Visit | Attending: Surgery | Admitting: Surgery

## 2020-10-26 ENCOUNTER — Encounter (HOSPITAL_COMMUNITY): Payer: Self-pay

## 2020-10-26 DIAGNOSIS — Z01818 Encounter for other preprocedural examination: Secondary | ICD-10-CM | POA: Insufficient documentation

## 2020-10-26 HISTORY — DX: Gastro-esophageal reflux disease without esophagitis: K21.9

## 2020-10-26 LAB — BASIC METABOLIC PANEL
Anion gap: 9 (ref 5–15)
BUN: 10 mg/dL (ref 6–20)
CO2: 26 mmol/L (ref 22–32)
Calcium: 9.4 mg/dL (ref 8.9–10.3)
Chloride: 104 mmol/L (ref 98–111)
Creatinine, Ser: 0.85 mg/dL (ref 0.44–1.00)
GFR, Estimated: >60 mL/min (ref >60)
Glucose, Bld: 177 mg/dL — ABNORMAL HIGH (ref 70–99)
Potassium: 4.2 mmol/L (ref 3.5–5.1)
Sodium: 139 mmol/L (ref 135–145)

## 2020-10-26 LAB — CBC
HCT: 39.9 % (ref 36.0–46.0)
Hemoglobin: 12.9 g/dL (ref 12.0–15.0)
MCH: 28 pg (ref 26.0–34.0)
MCHC: 32.3 g/dL (ref 30.0–36.0)
MCV: 86.6 fL (ref 80.0–100.0)
Platelets: 420 10*3/uL — ABNORMAL HIGH (ref 150–400)
RBC: 4.61 MIL/uL (ref 3.87–5.11)
RDW: 14.7 % (ref 11.5–15.5)
WBC: 7.8 10*3/uL (ref 4.0–10.5)
nRBC: 0 % (ref 0.0–0.2)

## 2020-10-26 LAB — GLUCOSE, CAPILLARY: Glucose-Capillary: 190 mg/dL — ABNORMAL HIGH (ref 70–99)

## 2020-10-26 LAB — HEMOGLOBIN A1C
Hgb A1c MFr Bld: 6.8 % — ABNORMAL HIGH (ref 4.8–5.6)
Mean Plasma Glucose: 148.46 mg/dL

## 2020-10-27 ENCOUNTER — Ambulatory Visit (HOSPITAL_COMMUNITY)
Admission: RE | Admit: 2020-10-27 | Discharge: 2020-10-27 | Disposition: A | Discharge status: Home / Self Care | Payer: Medicare Other | Source: Ambulatory Visit | Attending: Gastroenterology | Admitting: Gastroenterology

## 2020-10-27 DIAGNOSIS — R1013 Epigastric pain: Secondary | ICD-10-CM | POA: Diagnosis present

## 2020-10-27 MED ORDER — TECHNETIUM TC 99M SULFUR COLLOID
2.2000 | Freq: Once | INTRAVENOUS | Status: AC | PRN
  Administered 2020-10-27: 2.2 via INTRAVENOUS

## 2020-10-29 NOTE — Progress Notes (Signed)
Spoke to patient and she stated that she was instructed to stop Pletal three days preop.  She will take last dose on 10-29-20.

## 2020-10-30 ENCOUNTER — Other Ambulatory Visit (HOSPITAL_COMMUNITY)
Admission: RE | Admit: 2020-10-30 | Discharge: 2020-10-30 | Disposition: A | Discharge status: Home / Self Care | Payer: Medicare Other | Source: Ambulatory Visit | Attending: Surgery | Admitting: Surgery

## 2020-10-30 DIAGNOSIS — Z20822 Contact with and (suspected) exposure to covid-19: Secondary | ICD-10-CM | POA: Diagnosis not present

## 2020-10-30 DIAGNOSIS — Z01812 Encounter for preprocedural laboratory examination: Secondary | ICD-10-CM | POA: Insufficient documentation

## 2020-10-30 LAB — SARS CORONAVIRUS 2 (TAT 6-24 HRS): SARS Coronavirus 2: NEGATIVE

## 2020-10-31 ENCOUNTER — Encounter (HOSPITAL_COMMUNITY): Payer: Self-pay | Admitting: Surgery

## 2020-11-02 ENCOUNTER — Ambulatory Visit (HOSPITAL_COMMUNITY): Payer: Medicare Other | Admitting: Certified Registered Nurse Anesthetist

## 2020-11-02 ENCOUNTER — Ambulatory Visit (HOSPITAL_COMMUNITY)
Admission: RE | Disposition: A | Discharge status: Home / Self Care | Payer: Self-pay | Source: Home / Self Care | Attending: Surgery

## 2020-11-02 ENCOUNTER — Ambulatory Visit (HOSPITAL_COMMUNITY): Payer: Medicare Other

## 2020-11-02 ENCOUNTER — Other Ambulatory Visit: Payer: Self-pay

## 2020-11-02 ENCOUNTER — Encounter (HOSPITAL_COMMUNITY): Payer: Self-pay | Admitting: Surgery

## 2020-11-02 ENCOUNTER — Ambulatory Visit (HOSPITAL_COMMUNITY)
Admission: RE | Admit: 2020-11-02 | Discharge: 2020-11-03 | Disposition: A | Discharge status: Home / Self Care | Payer: Medicare Other | Attending: Surgery | Admitting: Surgery

## 2020-11-02 DIAGNOSIS — K828 Other specified diseases of gallbladder: Secondary | ICD-10-CM | POA: Diagnosis not present

## 2020-11-02 DIAGNOSIS — K8010 Calculus of gallbladder with chronic cholecystitis without obstruction: Secondary | ICD-10-CM | POA: Diagnosis not present

## 2020-11-02 DIAGNOSIS — Z888 Allergy status to other drugs, medicaments and biological substances status: Secondary | ICD-10-CM | POA: Insufficient documentation

## 2020-11-02 DIAGNOSIS — K8020 Calculus of gallbladder without cholecystitis without obstruction: Secondary | ICD-10-CM

## 2020-11-02 DIAGNOSIS — Z8379 Family history of other diseases of the digestive system: Secondary | ICD-10-CM | POA: Diagnosis not present

## 2020-11-02 HISTORY — PX: CHOLECYSTECTOMY: SHX55

## 2020-11-02 LAB — GLUCOSE, CAPILLARY
Glucose-Capillary: 172 mg/dL — ABNORMAL HIGH (ref 70–99)
Glucose-Capillary: 188 mg/dL — ABNORMAL HIGH (ref 70–99)
Glucose-Capillary: 324 mg/dL — ABNORMAL HIGH (ref 70–99)
Glucose-Capillary: 128 mg/dL — ABNORMAL HIGH (ref 70–99)

## 2020-11-02 SURGERY — LAPAROSCOPIC CHOLECYSTECTOMY WITH INTRAOPERATIVE CHOLANGIOGRAM
Anesthesia: General

## 2020-11-02 MED ORDER — ORAL CARE MOUTH RINSE: 15.0000 mL | Freq: Once | OROMUCOSAL | Status: AC

## 2020-11-02 MED ORDER — INSULIN ASPART 100 UNIT/ML IJ SOLN
0.0000 [IU] | Freq: Three times a day (TID) | INTRAMUSCULAR | Status: DC
  Administered 2020-11-02: 11 [IU] via SUBCUTANEOUS

## 2020-11-02 MED ORDER — PROPOFOL 10 MG/ML IV BOLUS
INTRAVENOUS | Status: DC | PRN
  Administered 2020-11-02: 100 mg via INTRAVENOUS

## 2020-11-02 MED ORDER — LACTATED RINGERS IV SOLN: INTRAVENOUS | Status: DC

## 2020-11-02 MED ORDER — PHENYLEPHRINE 40 MCG/ML (10ML) SYRINGE FOR IV PUSH (FOR BLOOD PRESSURE SUPPORT)
PREFILLED_SYRINGE | INTRAVENOUS | Status: AC
  Filled 2020-11-02: qty 10

## 2020-11-02 MED ORDER — CHLORHEXIDINE GLUCONATE CLOTH 2 % EX PADS: 6.0000 | MEDICATED_PAD | Freq: Once | CUTANEOUS | Status: DC

## 2020-11-02 MED ORDER — PROMETHAZINE HCL 25 MG/ML IJ SOLN
INTRAMUSCULAR | Status: AC
  Filled 2020-11-02: qty 1

## 2020-11-02 MED ORDER — ONDANSETRON 4 MG PO TBDP: 4.0000 mg | ORAL_TABLET | Freq: Four times a day (QID) | ORAL | Status: DC | PRN

## 2020-11-02 MED ORDER — LOSARTAN POTASSIUM 25 MG PO TABS
25.0000 mg | ORAL_TABLET | Freq: Every day | ORAL | Status: DC
  Administered 2020-11-02 – 2020-11-03 (×2): 25 mg via ORAL
  Filled 2020-11-02 (×3): qty 1

## 2020-11-02 MED ORDER — PROMETHAZINE HCL 25 MG/ML IJ SOLN
6.2500 mg | INTRAMUSCULAR | Status: DC | PRN
  Administered 2020-11-02: 12.5 mg via INTRAVENOUS

## 2020-11-02 MED ORDER — KETAMINE HCL 10 MG/ML IJ SOLN
INTRAMUSCULAR | Status: AC
  Filled 2020-11-02: qty 1

## 2020-11-02 MED ORDER — PHENYLEPHRINE HCL-NACL 10-0.9 MG/250ML-% IV SOLN
INTRAVENOUS | Status: DC | PRN
  Administered 2020-11-02: 60 ug/min via INTRAVENOUS

## 2020-11-02 MED ORDER — ROCURONIUM BROMIDE 10 MG/ML (PF) SYRINGE
PREFILLED_SYRINGE | INTRAVENOUS | Status: DC | PRN
  Administered 2020-11-02: 70 mg via INTRAVENOUS

## 2020-11-02 MED ORDER — ACETAMINOPHEN 325 MG PO TABS: 650.0000 mg | ORAL_TABLET | Freq: Four times a day (QID) | ORAL | Status: DC | PRN

## 2020-11-02 MED ORDER — FENTANYL CITRATE (PF) 250 MCG/5ML IJ SOLN
INTRAMUSCULAR | Status: DC | PRN
  Administered 2020-11-02 (×2): 50 ug via INTRAVENOUS

## 2020-11-02 MED ORDER — ROCURONIUM BROMIDE 10 MG/ML (PF) SYRINGE
PREFILLED_SYRINGE | INTRAVENOUS | Status: AC
  Filled 2020-11-02: qty 10

## 2020-11-02 MED ORDER — LINAGLIPTIN 5 MG PO TABS
5.0000 mg | ORAL_TABLET | Freq: Every day | ORAL | Status: DC
  Administered 2020-11-02 – 2020-11-03 (×2): 5 mg via ORAL
  Filled 2020-11-02 (×2): qty 1

## 2020-11-02 MED ORDER — MIDAZOLAM HCL 5 MG/5ML IJ SOLN
INTRAMUSCULAR | Status: DC | PRN
  Administered 2020-11-02: 2 mg via INTRAVENOUS

## 2020-11-02 MED ORDER — OXYCODONE HCL 5 MG PO TABS: 5.0000 mg | ORAL_TABLET | Freq: Once | ORAL | Status: DC | PRN

## 2020-11-02 MED ORDER — KETAMINE HCL 10 MG/ML IJ SOLN
INTRAMUSCULAR | Status: DC | PRN
  Administered 2020-11-02: 20 mg via INTRAVENOUS
  Administered 2020-11-02: 10 mg via INTRAVENOUS

## 2020-11-02 MED ORDER — EPHEDRINE SULFATE-NACL 50-0.9 MG/10ML-% IV SOSY
PREFILLED_SYRINGE | INTRAVENOUS | Status: DC | PRN
  Administered 2020-11-02: 5 mg via INTRAVENOUS

## 2020-11-02 MED ORDER — DEXAMETHASONE SODIUM PHOSPHATE 10 MG/ML IJ SOLN
INTRAMUSCULAR | Status: DC | PRN
  Administered 2020-11-02: 4 mg via INTRAVENOUS

## 2020-11-02 MED ORDER — TRAMADOL HCL 50 MG PO TABS: 50.0000 mg | ORAL_TABLET | Freq: Four times a day (QID) | ORAL | Status: DC | PRN

## 2020-11-02 MED ORDER — GABAPENTIN 300 MG PO CAPS
300.0000 mg | ORAL_CAPSULE | Freq: Three times a day (TID) | ORAL | Status: DC
  Administered 2020-11-02 – 2020-11-03 (×3): 300 mg via ORAL
  Filled 2020-11-02 (×3): qty 1

## 2020-11-02 MED ORDER — TRAZODONE HCL 100 MG PO TABS
100.0000 mg | ORAL_TABLET | Freq: Every day | ORAL | Status: DC
  Administered 2020-11-02: 100 mg via ORAL
  Filled 2020-11-02: qty 1

## 2020-11-02 MED ORDER — 0.9 % SODIUM CHLORIDE (POUR BTL) OPTIME
TOPICAL | Status: DC | PRN
  Administered 2020-11-02: 1000 mL

## 2020-11-02 MED ORDER — LIDOCAINE 2% (20 MG/ML) 5 ML SYRINGE
INTRAMUSCULAR | Status: AC
  Filled 2020-11-02: qty 5

## 2020-11-02 MED ORDER — PANTOPRAZOLE SODIUM 40 MG PO TBEC
40.0000 mg | DELAYED_RELEASE_TABLET | Freq: Every day | ORAL | Status: DC
  Administered 2020-11-02 – 2020-11-03 (×2): 40 mg via ORAL
  Filled 2020-11-02 (×2): qty 1

## 2020-11-02 MED ORDER — ACETAMINOPHEN 500 MG PO TABS
ORAL_TABLET | ORAL | Status: AC
  Filled 2020-11-02: qty 2

## 2020-11-02 MED ORDER — ALBUTEROL SULFATE HFA 108 (90 BASE) MCG/ACT IN AERS
2.0000 | INHALATION_SPRAY | RESPIRATORY_TRACT | Status: DC | PRN
  Filled 2020-11-02: qty 6.7

## 2020-11-02 MED ORDER — CILOSTAZOL 100 MG PO TABS
100.0000 mg | ORAL_TABLET | Freq: Two times a day (BID) | ORAL | Status: DC
  Administered 2020-11-02 – 2020-11-03 (×2): 100 mg via ORAL
  Filled 2020-11-02 (×3): qty 1

## 2020-11-02 MED ORDER — SUGAMMADEX SODIUM 200 MG/2ML IV SOLN
INTRAVENOUS | Status: DC | PRN
  Administered 2020-11-02: 200 mg via INTRAVENOUS

## 2020-11-02 MED ORDER — BUPROPION HCL ER (SR) 150 MG PO TB12
150.0000 mg | ORAL_TABLET | Freq: Two times a day (BID) | ORAL | Status: DC
  Administered 2020-11-02 – 2020-11-03 (×2): 150 mg via ORAL
  Filled 2020-11-02 (×2): qty 1

## 2020-11-02 MED ORDER — SCOPOLAMINE 1 MG/3DAYS TD PT72
MEDICATED_PATCH | TRANSDERMAL | Status: AC
  Administered 2020-11-02: 1.5 mg via TRANSDERMAL
  Filled 2020-11-02: qty 1

## 2020-11-02 MED ORDER — EPHEDRINE 5 MG/ML INJ
INTRAVENOUS | Status: AC
  Filled 2020-11-02: qty 10

## 2020-11-02 MED ORDER — HYDROMORPHONE HCL 1 MG/ML IJ SOLN: 0.2500 mg | INTRAMUSCULAR | Status: DC | PRN

## 2020-11-02 MED ORDER — PHENYLEPHRINE 40 MCG/ML (10ML) SYRINGE FOR IV PUSH (FOR BLOOD PRESSURE SUPPORT)
PREFILLED_SYRINGE | INTRAVENOUS | Status: DC | PRN
  Administered 2020-11-02: 40 ug via INTRAVENOUS
  Administered 2020-11-02: 80 ug via INTRAVENOUS

## 2020-11-02 MED ORDER — OXYCODONE HCL 5 MG/5ML PO SOLN: 5.0000 mg | Freq: Once | ORAL | Status: DC | PRN

## 2020-11-02 MED ORDER — MIDAZOLAM HCL 2 MG/2ML IJ SOLN
INTRAMUSCULAR | Status: AC
  Filled 2020-11-02: qty 2

## 2020-11-02 MED ORDER — FENTANYL CITRATE (PF) 250 MCG/5ML IJ SOLN
INTRAMUSCULAR | Status: AC
  Filled 2020-11-02: qty 5

## 2020-11-02 MED ORDER — LACTATED RINGERS IR SOLN
Status: DC | PRN
  Administered 2020-11-02: 1000

## 2020-11-02 MED ORDER — METFORMIN HCL 500 MG PO TABS
1000.0000 mg | ORAL_TABLET | Freq: Two times a day (BID) | ORAL | Status: DC
  Administered 2020-11-02 – 2020-11-03 (×2): 1000 mg via ORAL
  Filled 2020-11-02 (×2): qty 2

## 2020-11-02 MED ORDER — BUPIVACAINE-EPINEPHRINE (PF) 0.5% -1:200000 IJ SOLN
INTRAMUSCULAR | Status: AC
  Filled 2020-11-02: qty 30

## 2020-11-02 MED ORDER — LIDOCAINE 2% (20 MG/ML) 5 ML SYRINGE
INTRAMUSCULAR | Status: DC | PRN
  Administered 2020-11-02: 40 mg via INTRAVENOUS
  Administered 2020-11-02: 60 mg via INTRAVENOUS

## 2020-11-02 MED ORDER — DEXAMETHASONE SODIUM PHOSPHATE 10 MG/ML IJ SOLN
INTRAMUSCULAR | Status: AC
  Filled 2020-11-02: qty 1

## 2020-11-02 MED ORDER — MIDAZOLAM HCL 2 MG/2ML IJ SOLN: 0.5000 mg | Freq: Once | INTRAMUSCULAR | Status: DC | PRN

## 2020-11-02 MED ORDER — ONDANSETRON HCL 4 MG/2ML IJ SOLN
INTRAMUSCULAR | Status: AC
  Filled 2020-11-02: qty 2

## 2020-11-02 MED ORDER — SCOPOLAMINE 1 MG/3DAYS TD PT72: 1.0000 | MEDICATED_PATCH | TRANSDERMAL | Status: DC

## 2020-11-02 MED ORDER — SODIUM CHLORIDE 0.9 % IV SOLN
INTRAVENOUS | Status: DC | PRN
  Administered 2020-11-02: 6 mL

## 2020-11-02 MED ORDER — GLIPIZIDE 10 MG PO TABS
10.0000 mg | ORAL_TABLET | Freq: Two times a day (BID) | ORAL | Status: DC
  Administered 2020-11-02 – 2020-11-03 (×2): 10 mg via ORAL
  Filled 2020-11-02 (×2): qty 1

## 2020-11-02 MED ORDER — SITAGLIPTIN PHOS-METFORMIN HCL 50-1000 MG PO TABS: 1.0000 | ORAL_TABLET | Freq: Two times a day (BID) | ORAL | Status: DC

## 2020-11-02 MED ORDER — SODIUM CHLORIDE 0.45 % IV SOLN: INTRAVENOUS | Status: DC

## 2020-11-02 MED ORDER — ACETAMINOPHEN 650 MG RE SUPP: 650.0000 mg | Freq: Four times a day (QID) | RECTAL | Status: DC | PRN

## 2020-11-02 MED ORDER — CEFAZOLIN SODIUM-DEXTROSE 2-4 GM/100ML-% IV SOLN
2.0000 g | INTRAVENOUS | Status: AC
  Administered 2020-11-02: 2 g via INTRAVENOUS
  Filled 2020-11-02: qty 100

## 2020-11-02 MED ORDER — ONDANSETRON HCL 4 MG/2ML IJ SOLN
INTRAMUSCULAR | Status: DC | PRN
  Administered 2020-11-02: 4 mg via INTRAVENOUS

## 2020-11-02 MED ORDER — HYDROCODONE-ACETAMINOPHEN 5-325 MG PO TABS
1.0000 | ORAL_TABLET | ORAL | Status: DC | PRN
  Administered 2020-11-02 (×2): 1 via ORAL
  Administered 2020-11-03: 2 via ORAL
  Administered 2020-11-03: 1 via ORAL
  Administered 2020-11-03: 2 via ORAL
  Filled 2020-11-02: qty 1
  Filled 2020-11-02: qty 2
  Filled 2020-11-02 (×2): qty 1
  Filled 2020-11-02: qty 2

## 2020-11-02 MED ORDER — CHLORHEXIDINE GLUCONATE 0.12 % MT SOLN
15.0000 mL | Freq: Once | OROMUCOSAL | Status: AC
  Administered 2020-11-02: 15 mL via OROMUCOSAL

## 2020-11-02 MED ORDER — ACETAMINOPHEN 500 MG PO TABS
1000.0000 mg | ORAL_TABLET | Freq: Once | ORAL | Status: AC
  Administered 2020-11-02: 1000 mg via ORAL

## 2020-11-02 MED ORDER — MEPERIDINE HCL 50 MG/ML IJ SOLN: 6.2500 mg | INTRAMUSCULAR | Status: DC | PRN

## 2020-11-02 MED ORDER — HYDROMORPHONE HCL 1 MG/ML IJ SOLN: 1.0000 mg | INTRAMUSCULAR | Status: DC | PRN

## 2020-11-02 MED ORDER — ONDANSETRON HCL 4 MG/2ML IJ SOLN: 4.0000 mg | Freq: Four times a day (QID) | INTRAMUSCULAR | Status: DC | PRN

## 2020-11-02 MED ORDER — PROPOFOL 10 MG/ML IV BOLUS
INTRAVENOUS | Status: AC
  Filled 2020-11-02: qty 20

## 2020-11-02 SURGICAL SUPPLY — 36 items
APPLIER CLIP ROT 10 11.4 M/L (STAPLE) ×2
CABLE HIGH FREQUENCY MONO STRZ (ELECTRODE) ×2 IMPLANT
CHLORAPREP W/TINT 26 (MISCELLANEOUS) ×4 IMPLANT
CLIP APPLIE ROT 10 11.4 M/L (STAPLE) ×1 IMPLANT
COVER MAYO STAND STRL (DRAPES) ×2 IMPLANT
COVER SURGICAL LIGHT HANDLE (MISCELLANEOUS) ×2 IMPLANT
COVER WAND RF STERILE (DRAPES) IMPLANT
DECANTER SPIKE VIAL GLASS SM (MISCELLANEOUS) ×2 IMPLANT
DERMABOND ADVANCED (GAUZE/BANDAGES/DRESSINGS) ×1
DERMABOND ADVANCED .7 DNX12 (GAUZE/BANDAGES/DRESSINGS) ×1 IMPLANT
DRAPE C-ARM 42X120 X-RAY (DRAPES) ×2 IMPLANT
ELECT REM PT RETURN 15FT ADLT (MISCELLANEOUS) ×2 IMPLANT
GAUZE SPONGE 2X2 8PLY STRL LF (GAUZE/BANDAGES/DRESSINGS) ×1 IMPLANT
GLOVE SURG ENC MOIS LTX SZ6.5 (GLOVE) ×2 IMPLANT
GLOVE SURG ORTHO LTX SZ8 (GLOVE) ×2 IMPLANT
GLOVE SURG UNDER POLY LF SZ7 (GLOVE) ×4 IMPLANT
GOWN STRL REUS W/TWL XL LVL3 (GOWN DISPOSABLE) ×4 IMPLANT
HEMOSTAT SURGICEL 4X8 (HEMOSTASIS) IMPLANT
KIT BASIN OR (CUSTOM PROCEDURE TRAY) ×2 IMPLANT
KIT TURNOVER KIT A (KITS) ×2 IMPLANT
PENCIL SMOKE EVACUATOR (MISCELLANEOUS) IMPLANT
POUCH SPECIMEN RETRIEVAL 10MM (ENDOMECHANICALS) ×2 IMPLANT
SCISSORS LAP 5X35 DISP (ENDOMECHANICALS) ×2 IMPLANT
SET CHOLANGIOGRAPH MIX (MISCELLANEOUS) ×2 IMPLANT
SET IRRIG TUBING LAPAROSCOPIC (IRRIGATION / IRRIGATOR) ×2 IMPLANT
SET TUBE SMOKE EVAC HIGH FLOW (TUBING) ×2 IMPLANT
SLEEVE XCEL OPT CAN 5 100 (ENDOMECHANICALS) ×2 IMPLANT
SPONGE GAUZE 2X2 STER 10/PKG (GAUZE/BANDAGES/DRESSINGS) ×1
STRIP CLOSURE SKIN 1/2X4 (GAUZE/BANDAGES/DRESSINGS) IMPLANT
SUT MNCRL AB 4-0 PS2 18 (SUTURE) ×2 IMPLANT
TOWEL OR 17X26 10 PK STRL BLUE (TOWEL DISPOSABLE) ×2 IMPLANT
TOWEL OR NON WOVEN STRL DISP B (DISPOSABLE) ×2 IMPLANT
TRAY LAPAROSCOPIC (CUSTOM PROCEDURE TRAY) ×2 IMPLANT
TROCAR BLADELESS OPT 5 100 (ENDOMECHANICALS) ×2 IMPLANT
TROCAR XCEL BLUNT TIP 100MML (ENDOMECHANICALS) ×2 IMPLANT
TROCAR XCEL NON-BLD 11X100MML (ENDOMECHANICALS) ×2 IMPLANT

## 2020-11-02 NOTE — Transfer of Care (Signed)
Immediate Anesthesia Transfer of Care Note  Patient: Cindy Baker  Procedure(s) Performed: LAPAROSCOPIC CHOLECYSTECTOMY WITH INTRAOPERATIVE CHOLANGIOGRAM (N/A )  Patient Location: PACU  Anesthesia Type:General  Level of Consciousness: drowsy  Airway & Oxygen Therapy: Patient Spontanous Breathing and Patient connected to face mask oxygen  Post-op Assessment: Report given to RN and Post -op Vital signs reviewed and stable  Post vital signs: Reviewed and stable  Last Vitals:  Vitals Value Taken Time  BP 147/76 11/02/20 1112  Temp    Pulse 85 11/02/20 1115  Resp 20 11/02/20 1115  SpO2 98 % 11/02/20 1115  Vitals shown include unvalidated device data.  Last Pain:  Vitals:   11/02/20 0834  TempSrc:   PainSc: 5          Complications: No complications documented.

## 2020-11-02 NOTE — Anesthesia Preprocedure Evaluation (Addendum)
Anesthesia Evaluation  Patient identified by MRN, date of birth, ID band Patient awake    Reviewed: Allergy & Precautions, NPO status , Patient's Chart, lab work & pertinent test results  History of Anesthesia Complications Negative for: history of anesthetic complications  Airway Mallampati: II  TM Distance: >3 FB Neck ROM: Full    Dental  (+) Edentulous Upper, Edentulous Lower   Pulmonary COPD,  COPD inhaler, Current SmokerPatient did not abstain from smoking.,  10/30/2020 SARS coronavirus NEG   breath sounds clear to auscultation       Cardiovascular hypertension, Pt. on medications (-) angina Rhythm:Regular Rate:Normal     Neuro/Psych Anxiety Depression negative neurological ROS     GI/Hepatic Neg liver ROS, GERD  Controlled and Medicated,  Endo/Other  diabetes (glu 172), Oral Hypoglycemic Agents  Renal/GU negative Renal ROS     Musculoskeletal  (+) Arthritis , Fibromyalgia -, narcotic dependent  Abdominal   Peds  Hematology pletal   Anesthesia Other Findings   Reproductive/Obstetrics                            Anesthesia Physical Anesthesia Plan  ASA: III  Anesthesia Plan: General   Post-op Pain Management:    Induction: Intravenous  PONV Risk Score and Plan: 2 and Ondansetron, Dexamethasone and Scopolamine patch - Pre-op  Airway Management Planned: Oral ETT  Additional Equipment: None  Intra-op Plan:   Post-operative Plan: Extubation in OR  Informed Consent: I have reviewed the patients History and Physical, chart, labs and discussed the procedure including the risks, benefits and alternatives for the proposed anesthesia with the patient or authorized representative who has indicated his/her understanding and acceptance.     Dental advisory given  Plan Discussed with: CRNA and Surgeon  Anesthesia Plan Comments:        Anesthesia Quick Evaluation

## 2020-11-02 NOTE — Op Note (Signed)
Procedure Note  Pre-operative Diagnosis:  Chronic cholecystitis, cholelithiasis, biliary dyskinesia  Post-operative Diagnosis:  same  Surgeon:  Armandina Gemma, MD  Assistant:  Carlena Hurl, PA-C   Procedure:  Laparoscopic cholecystectomy with intra-operative cholangiography  Anesthesia:  General  Estimated Blood Loss:  minimal  Drains: none         Specimen: gallbladder to pathology  Indications:  Patient is referred by Vicie Mutters, PA, for surgical evaluation and management of symptomatic cholelithiasis, chronic cholecystitis, and biliary dyskinesia. Patient has had a history of 4-5 years of gastrointestinal issues. This includes irritable bowel syndrome, chronic diarrhea, and abdominal pain. Recent evaluation included an upper endoscopy which may demonstrate evidence of gastroparesis. Patient had an ultrasound on July 27, 2020 which demonstrated intraluminal calculi measuring up to 9 mm in size. Also underwent a nuclear medicine hepatobiliary scan on August 17, 2020 which showed a low gallbladder ejection fraction of 20%. Based on these studies, the patient is referred to surgery for consideration for cholecystectomy.   Procedure Details:  The patient was seen in the pre-op holding area. The risks, benefits, complications, treatment options, and expected outcomes were previously discussed with the patient. The patient agreed with the proposed plan and has signed the informed consent form.  The patient was transported to operating room #3 at the [facility]. The patient was placed in the supine position on the operating room table. Following induction of general anesthesia, the abdomen was prepped and draped in the usual aseptic fashion.  An incision was made in the skin near the umbilicus. The midline fascia was incised and the peritoneal cavity was entered and a Hasson cannula was introduced under direct vision. The cannula was secured with a 0-Vicryl pursestring  suture. Pneumoperitoneum was established with carbon dioxide. Additional cannulae were introduced under direct vision along the right costal margin in the midline, mid-clavicular line, and anterior axillary line.   The gallbladder was identified and the fundus grasped and retracted cephalad. Adhesions were taken down bluntly and the electrocautery was utilized as needed, taking care not to involve any adjacent structures. The infundibulum was grasped and retracted laterally, exposing the peritoneum overlying the triangle of Calot. The peritoneum was incised and structures exposed with blunt dissection. The cystic duct was clearly identified, bluntly dissected circumferentially, and clipped at the neck of the gallbladder.  An incision was made in the cystic duct and the cholangiogram catheter introduced. The catheter was secured using an ligaclip.  Real-time cholangiography was performed using C-arm fluoroscopy.  There was rapid filling of a normal caliber common bile duct.  There was reflux of contrast into the left and right hepatic ductal systems.  There was free flow distally into the duodenum without filling defect or obstruction.  The catheter was removed from the peritoneal cavity.  The cystic duct was then ligated with ligaclips and divided. The cystic artery was identified, dissected circumferentially, ligated with ligaclips, and divided.  The gallbladder was dissected away from the gallbladder bed using the electrocautery for hemostasis. The gallbladder was completely removed from the liver and placed into an endocatch bag. The gallbladder was removed in the endocatch bag through the umbilical port site and submitted to pathology for review.  The right upper quadrant was irrigated and the gallbladder bed was inspected. Hemostasis was achieved with the electrocautery.  Cannulae were removed under direct vision and good hemostasis was noted. Pneumoperitoneum was released and the majority of the  carbon dioxide evacuated. The umbilical wound was irrigated and the fascia  was then closed with the pursestring suture.  Local anesthetic was infiltrated at all port sites. Skin incisions were closed with 4-0 Monocril subcuticular sutures and Dermabond was applied.  Instrument, sponge, and needle counts were correct at the conclusion of the case.  The patient was awakened from anesthesia and brought to the recovery room in stable condition.  The patient tolerated the procedure well.   Armandina Gemma, MD [facility] Office: [phone]

## 2020-11-02 NOTE — Anesthesia Procedure Notes (Signed)
Procedure Name: Intubation Performed by: Milford Cage, CRNA Pre-anesthesia Checklist: Patient identified, Emergency Drugs available, Suction available and Patient being monitored Patient Re-evaluated:Patient Re-evaluated prior to induction Oxygen Delivery Method: Circle System Utilized Preoxygenation: Pre-oxygenation with 100% oxygen Induction Type: IV induction Ventilation: Mask ventilation without difficulty Laryngoscope Size: Mac and 3 Grade View: Grade I Tube type: Oral Tube size: 7.0 mm Number of attempts: 1 Airway Equipment and Method: Stylet and Oral airway Placement Confirmation: ETT inserted through vocal cords under direct vision,  positive ETCO2 and breath sounds checked- equal and bilateral Secured at: 21 cm Tube secured with: Tape Dental Injury: Teeth and Oropharynx as per pre-operative assessment

## 2020-11-02 NOTE — Interval H&P Note (Signed)
History and Physical Interval Note:  11/02/2020 9:27 AM  Cindy Baker  has presented today for surgery, with the diagnosis of CHRONIC CHOLECYSTITIS, CHOLELITHIASIS, BILIARY DYSKINESIA.  The various methods of treatment have been discussed with the patient and family. After consideration of risks, benefits and other options for treatment, the patient has consented to    Procedure(s): LAPAROSCOPIC CHOLECYSTECTOMY WITH INTRAOPERATIVE CHOLANGIOGRAM (N/A) as a surgical intervention.    The patient's history has been reviewed, patient examined, no change in status, stable for surgery.  I have reviewed the patient's chart and labs.  Questions were answered to the patient's satisfaction.    Armandina Gemma, MD [facility] Office: [place]

## 2020-11-02 NOTE — Anesthesia Postprocedure Evaluation (Signed)
Anesthesia Post Note  Patient: Cindy Baker  Procedure(s) Performed: LAPAROSCOPIC CHOLECYSTECTOMY WITH INTRAOPERATIVE CHOLANGIOGRAM (N/A )     Patient location during evaluation: PACU Anesthesia Type: General Level of consciousness: awake and alert, patient cooperative and oriented Pain management: pain level controlled Vital Signs Assessment: post-procedure vital signs reviewed and stable Respiratory status: spontaneous breathing, nonlabored ventilation and respiratory function stable Cardiovascular status: blood pressure returned to baseline and stable Postop Assessment: no apparent nausea or vomiting Anesthetic complications: no   No complications documented.  Last Vitals:  Vitals:   11/02/20 1311 11/02/20 1411  BP: 125/64 106/67  Pulse:  78  Resp: 18 16  Temp: 36.6 C 36.4 C  SpO2: 100% 100%    Last Pain:  Vitals:   11/02/20 1411  TempSrc: Oral  PainSc:                  Cumi Sanagustin,E. Leeta Grimme

## 2020-11-02 NOTE — Discharge Instructions (Signed)
[facility]  LAPAROSCOPIC SURGERY:  POST-OP INSTRUCTIONS  Always review your discharge instruction sheet given to you by the facility where your surgery was performed.  A prescription for pain medication may be given to you upon discharge.  Take your pain medication as prescribed.  If narcotic pain medicine is not needed, then you may take acetaminophen (Tylenol) or ibuprofen (Advil) as needed.  Take your usually prescribed medications unless otherwise directed.  If you need a refill on your pain medication, please contact your pharmacy.  They will contact our office to request authorization. Prescriptions will not be filled after 5 P.M. or on weekends.  You should follow a light diet the first few days after arrival home, such as soup and crackers or toast.  Be sure to include plenty of fluids daily.  Most patients will experience some swelling and bruising in the area of the incisions.  Ice packs will help.  Swelling and bruising can take several days to resolve.   It is common to experience some constipation after surgery.  Increasing fluid intake and taking a stool softener (such as Colace) will usually help or prevent this problem from occurring.  A mild laxative (Milk of Magnesia or Miralax) should be taken according to package instructions if there has been no bowel movement after 48 hours.  You will likely have Dermabond (topical glue) over your incisions.  This seals the incisions and allows you to bathe and shower at any time after your surgery.  Glue should remain in place for up to 10 days.  It may be removed after 10 days by pealing off the Dermabond material or using Vaseline or naval jelly to remove.  If you have steri-strips over your incisions, you may remove the gauze bandage on the second day after surgery, and you may shower at that time.  Leave your steri-strips (small skin tapes) in place directly over the incision.  These strips should remain on the  skin for 5-7 days and then be removed.  You may get them wet in the shower and pat them dry.  Any sutures or staples will be removed at the office during your follow-up visit.  ACTIVITIES:  You may resume regular (light) daily activities beginning the next day - such as daily self-care, walking, climbing stairs - gradually increasing activities as tolerated.  You may have sexual intercourse when it is comfortable.  Refrain from any heavy lifting or straining until approved by your doctor.  You may drive when you are no longer taking prescription pain medication, when you can comfortably wear a seatbelt, and when you can safely maneuver your car and apply brakes.  You should see your doctor in the office for a follow-up appointment approximately 2-3 weeks after your surgery.  Make sure that you call for this appointment within a day or two after you arrive home to insure a convenient appointment time.  WHEN TO CALL YOUR DOCTOR: 1. Fever over 101.0 2. Inability to urinate 3. Continued bleeding from incision 4. Increased pain, redness, or drainage from the incision 5. Increasing abdominal pain  The clinic staff is available to answer your questions during regular business hours.  Please don't hesitate to call and ask to speak to one of the nurses for clinical concerns.  If you have a medical emergency, go to the nearest emergency room or call 911.  A surgeon from [facility] is always on call for the hospital.  Armandina Gemma, [facility] Office: [phone] Toll Free:  [place] [phone]  Website: [email or web address]

## 2020-11-03 ENCOUNTER — Encounter (HOSPITAL_COMMUNITY): Payer: Self-pay | Admitting: Surgery

## 2020-11-03 DIAGNOSIS — K8010 Calculus of gallbladder with chronic cholecystitis without obstruction: Secondary | ICD-10-CM | POA: Diagnosis not present

## 2020-11-03 LAB — SURGICAL PATHOLOGY

## 2020-11-03 LAB — GLUCOSE, CAPILLARY
Glucose-Capillary: 100 mg/dL — ABNORMAL HIGH (ref 70–99)
Glucose-Capillary: 117 mg/dL — ABNORMAL HIGH (ref 70–99)

## 2020-11-03 NOTE — Progress Notes (Signed)
Discharge instructions given to patient and all questions were answered.  

## 2020-11-03 NOTE — Progress Notes (Signed)
Nutrition Education Note  RD consulted for nutrition education regarding gastroparesis    RD provided "Gastroparesis Nutrition Therapy" handout from the Academy of [employer] and Dietetics. Reviewed patient's dietary recall. Advised patient to eat multiple small meals, avoid high fat foods, and avoid high fiber foods. Advised patient to chew foods well before swallowing, drink fluids throughout meals, and to sit up or walk after meals if possible. Gave patient examples of meals and menu planning.   Teach back method used.  Expect fair compliance. Pt with daughter at bedside, was able to provide support. Pt became overwhelmed by diet information and feels like she has no food options. Pt concerned about how to manage diabetes with a low fiber diet. Reviewed strategies with pt and provided active listening.   Body mass index is 28.63 kg/m. Pt meets criteria for overweight based on current BMI.  Current diet order is CHO modified, patient is consuming approximately 75% of meals at this time. Labs and medications reviewed. No further nutrition interventions warranted at this time.  If additional nutrition issues arise, please re-consult RD.  Clayton Bibles, MS, RD, LDN Inpatient Clinical Dietitian Contact information available via Amion

## 2020-11-03 NOTE — Discharge Summary (Signed)
Physician Discharge Summary [facility]  Patient ID: Cindy Baker MRN: [id number] DOB/AGE: Mar 12, 1966 55 y.o.  Admit date: 11/02/2020  Discharge date: 11/03/2020  Discharge Diagnoses:  Principal Problem:   Cholelithiasis with chronic cholecystitis Active Problems:   Biliary dyskinesia   Discharged Condition: good  Hospital Course: Patient was admitted for observation following gallbladder surgery.  Post op course was uncomplicated.  Pain was well controlled.  Tolerated diet.  Patient was prepared for discharge home on POD#1.  Consults: None  Treatments: surgery: lap chole with IOC  Discharge Exam: Blood pressure (!) 125/57, pulse 88, temperature 97.9 F (36.6 C), temperature source Oral, resp. rate 18, height 5\' 4"  (1.626 m), weight 75.7 kg, SpO2 99 %. HEENT - clear Neck - soft Chest - clear bilaterally Cor - RRR Abd - soft without distension; wounds dry and intact  Disposition: Home  Discharge Instructions    Diet - low sodium heart healthy   Complete by: As directed    Discharge instructions   Complete by: As directed    [facility]  LAPAROSCOPIC SURGERY:  POST-OP INSTRUCTIONS  Always review your discharge instruction sheet given to you by the facility where your surgery was performed.  A prescription for pain medication may be given to you upon discharge.  Take your pain medication as prescribed.  If narcotic pain medicine is not needed, then you may take acetaminophen (Tylenol) or ibuprofen (Advil) as needed.  Take your usually prescribed medications unless otherwise directed.  If you need a refill on your pain medication, please contact your pharmacy.  They will contact our office to request authorization. Prescriptions will not be filled after 5 P.M. or on weekends.  You should follow a light diet the first few days after arrival home, such as soup and crackers or toast.  Be sure to include plenty of fluids  daily.  Most patients will experience some swelling and bruising in the area of the incisions.  Ice packs will help.  Swelling and bruising can take several days to resolve.   It is common to experience some constipation after surgery.  Increasing fluid intake and taking a stool softener (such as Colace) will usually help or prevent this problem from occurring.  A mild laxative (Milk of Magnesia or Miralax) should be taken according to package instructions if there has been no bowel movement after 48 hours.  You will likely have Dermabond (topical glue) over your incisions.  This seals the incisions and allows you to bathe and shower at any time after your surgery.  Glue should remain in place for up to 10 days.  It may be removed after 10 days by pealing off the Dermabond material or using Vaseline or naval jelly to remove.  If you have steri-strips over your incisions, you may remove the gauze bandage on the second day after surgery, and you may shower at that time.  Leave your steri-strips (small skin tapes) in place directly over the incision.  These strips should remain on the skin for 5-7 days and then be removed.  You may get them wet in the shower and pat them dry.  Any sutures or staples will be removed at the office during your follow-up visit.  ACTIVITIES:  You may resume regular (light) daily activities beginning the next day - such as daily self-care, walking, climbing stairs - gradually increasing activities as tolerated.  You may have sexual intercourse when it is comfortable.  Refrain from  any heavy lifting or straining until approved by your doctor.  You may drive when you are no longer taking prescription pain medication, when you can comfortably wear a seatbelt, and when you can safely maneuver your car and apply brakes.  You should see your doctor in the office for a follow-up appointment approximately 2-3 weeks after your surgery.  Make sure that you call for this appointment  within a day or two after you arrive home to insure a convenient appointment time.  WHEN TO CALL YOUR DOCTOR: Fever over 101.0 Inability to urinate Continued bleeding from incision Increased pain, redness, or drainage from the incision Increasing abdominal pain  The clinic staff is available to answer your questions during regular business hours.  Please don't hesitate to call and ask to speak to one of the nurses for clinical concerns.  If you have a medical emergency, go to the nearest emergency room or call 911.  A surgeon from [facility] is always on call for the hospital.  Armandina Gemma, [facility] Office: [place] Free:  [place] [phone]  Website: [email or web address]   Increase activity slowly   Complete by: As directed    No dressing needed   Complete by: As directed      Allergies as of 11/03/2020      Reactions   Neomycin Other (See Comments)   Eyes swelling      Medication List    TAKE these medications   Accu-Chek Aviva Connect w/Device Kit 1 each by Does not apply route 4 (four) times daily -  before meals and at bedtime.   accu-chek soft touch lancets Use as instructed   albuterol 108 (90 Base) MCG/ACT inhaler Commonly known as: VENTOLIN HFA Inhale 2 puffs into the lungs every 4 (four) hours as needed for wheezing or shortness of breath.   B-D UF III MINI PEN NEEDLES 31G X 5 MM Misc Generic drug: Insulin Pen Needle U BID UTD   B-D UF III MINI PEN NEEDLES 31G X 5 MM Misc Generic drug: Insulin Pen Needle USE TWICE DAILY AS DIRECTED   buPROPion 150 MG 12 hr tablet Commonly known as: WELLBUTRIN SR Take 1 tablet (150 mg total) by mouth 2 (two) times daily. Take 150 mg for 3 days. Increase to 150 mg twice daily   Bydureon 2 MG Pen Generic drug: Exenatide ER Inject 2 mg into the skin once a week.   cilostazol 100 MG tablet Commonly known as: PLETAL Take 1 tablet (100 mg total) by  mouth 2 (two) times daily before a meal.   diclofenac sodium 1 % Gel Commonly known as: VOLTAREN Apply 4 g topically 4 (four) times daily as needed (for pain).   DULoxetine 30 MG capsule Commonly known as: CYMBALTA TAKE 1 CAPSULE(30 MG) BY MOUTH DAILY BEFORE BREAKFAST   famotidine 20 MG tablet Commonly known as: PEPCID Take 20 mg by mouth 2 (two) times daily.   fluticasone 50 MCG/ACT nasal spray Commonly known as: FLONASE Place 2 sprays into both nostrils daily. What changed:   when to take this  reasons to take this   gabapentin 300 MG capsule Commonly known as: NEURONTIN Take 1 capsule (300 mg total) by mouth 3 (three) times daily.   gemfibrozil 600 MG tablet Commonly known as: LOPID Take 1 tablet (600 mg total) by mouth 2 (two) times daily before a meal.   glipiZIDE 10 MG tablet Commonly known as: GLUCOTROL Take 10 mg by mouth  2 (two) times daily before a meal.   glucose blood test strip Commonly known as: Accu-Chek Aviva Three times per day prior to meals and at bedtime. Use as instructed   HYDROcodone-acetaminophen 10-325 MG tablet Commonly known as: NORCO Take 1 tablet by mouth in the morning, at noon, and at bedtime.   hydrOXYzine 50 MG tablet Commonly known as: ATARAX/VISTARIL Take 50-100 mg by mouth in the morning and at bedtime.   Janumet 50-1000 MG tablet Generic drug: sitaGLIPtin-metformin Take 1 tablet by mouth 2 (two) times daily with a meal.   levocetirizine 5 MG tablet Commonly known as: Xyzal Take 1 tablet (5 mg total) by mouth every evening.   loratadine 10 MG tablet Commonly known as: CLARITIN Take 10 mg by mouth daily.   losartan 25 MG tablet Commonly known as: COZAAR Take 25 mg by mouth daily.   methocarbamol 500 MG tablet Commonly known as: ROBAXIN Take 1 tablet (500 mg total) by mouth 2 (two) times daily as needed for muscle spasms.   omeprazole 20 MG capsule Commonly known as: PRILOSEC TAKE 1 CAPSULE(20 MG) BY MOUTH TWICE  DAILY BEFORE A MEAL FOR 14 DAYS   oxybutynin 5 MG 24 hr tablet Commonly known as: Ditropan XL Take 1-2 tablets (5-10 mg total) by mouth at bedtime.   pantoprazole 40 MG tablet Commonly known as: PROTONIX Take 40 mg by mouth 2 (two) times daily.   rosuvastatin 20 MG tablet Commonly known as: CRESTOR Take 20 mg by mouth at bedtime.   traZODone 100 MG tablet Commonly known as: DESYREL Take 100 mg by mouth at bedtime.            Discharge Care Instructions  (From admission, onward)         Start     Ordered   11/03/20 0000  No dressing needed        11/03/20 1223          Follow-up Information    Armandina Gemma, MD. Schedule an appointment as soon as possible for a visit in 3 weeks.   Specialty: General Surgery Why: For wound re-check Contact information: [place] [address] [phone]               Armandina Gemma, [facility] Office: [phone]   Signed: Armandina Gemma 11/03/2020, 12:23 PM

## 2020-11-11 MED FILL — Lactated Ringer's for Irrigation: Qty: 1000 | Status: AC

## 2020-12-08 ENCOUNTER — Ambulatory Visit
Admission: RE | Admit: 2020-12-08 | Discharge: 2020-12-08 | Disposition: A | Discharge status: Home / Self Care | Payer: Medicare Other | Source: Ambulatory Visit | Attending: Physician Assistant | Admitting: Physician Assistant

## 2020-12-08 ENCOUNTER — Other Ambulatory Visit: Payer: Self-pay | Admitting: Physician Assistant

## 2020-12-08 DIAGNOSIS — R197 Diarrhea, unspecified: Secondary | ICD-10-CM

## 2020-12-10 ENCOUNTER — Other Ambulatory Visit: Payer: Self-pay | Admitting: Foot & Ankle Surgery

## 2020-12-10 ENCOUNTER — Ambulatory Visit
Admission: RE | Admit: 2020-12-10 | Discharge: 2020-12-10 | Disposition: A | Discharge status: Home / Self Care | Payer: Medicare Other | Source: Ambulatory Visit | Attending: Foot & Ankle Surgery | Admitting: Foot & Ankle Surgery

## 2020-12-10 DIAGNOSIS — M79674 Pain in right toe(s): Secondary | ICD-10-CM

## 2020-12-10 DIAGNOSIS — M79675 Pain in left toe(s): Secondary | ICD-10-CM

## 2021-02-04 ENCOUNTER — Encounter: Payer: Medicare Other | Attending: Family | Admitting: Dietician

## 2021-02-04 ENCOUNTER — Other Ambulatory Visit: Payer: Self-pay

## 2021-02-04 DIAGNOSIS — E1165 Type 2 diabetes mellitus with hyperglycemia: Secondary | ICD-10-CM | POA: Insufficient documentation

## 2021-02-04 DIAGNOSIS — K3184 Gastroparesis: Secondary | ICD-10-CM | POA: Insufficient documentation

## 2021-02-04 DIAGNOSIS — E118 Type 2 diabetes mellitus with unspecified complications: Secondary | ICD-10-CM | POA: Insufficient documentation

## 2021-02-04 DIAGNOSIS — IMO0002 Reserved for concepts with insufficient information to code with codable children: Secondary | ICD-10-CM

## 2021-02-04 NOTE — Progress Notes (Signed)
Medical Nutrition Therapy  Appointment Start time:  [phone]  Appointment End time:  [id number]  Primary concerns today: diet for gastroparesis.  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 Pylori, chronic inactive gastritis Medications: see list to include Janumet and Glipizide Labs: A1C 6.8% 10/26/2020 and increased per patient to 7.5% 12/2020, hydrogen breath test and pancreatic tests have been WNL per patient. Notable Signs/Symptoms: nausea, bloating, early satiety  Lifestyle & Dietary Hx Patient lives with her daughter and grandson.  She is on disability.  Her husband died of liver cancer Oct 03, 2019. She enjoys gardening, small wood projects, yard work. She is trying to follow a gastroparesis diet. She has not teeth and is unable to wear dentures.  She reports that she is able to [facility] the food well with her gums.  Concerned that she may not be able to chew foods well enough which is also contributing to her digestion issues.  Estimated daily fluid intake: >64 oz Supplements: Calcium and vitamin D, vitamin B-12, ALA Sleep: no more than 6 (feels good with this and usually has not slept more) Stress / self-care: coping well Current average weekly physical activity: gardening and yard work about 3 hours daily  24-Hr Dietary Recall First Meal: coffee with artificial sweetener and sugar free creamer, yogurt or eggs, fruit Snack: protein bar OR Glucerna or smoothie Second Meal: wrap (lunch meat, cheese,  shredded lettuce, green pepper and tomato paste, italian or balsamic dressing) Snack: cheese and crackers or fruit Third Meal: soft taco and 1/2 chicken burrito OR half hamburger on bun Snack: low carb fudgesickle Beverages: water, coffee, lightly sweetened tea (24 oz), occasional regular soda, diet green tea  Estimated Energy Needs Calories: 1500 Carbohydrate: 170g Protein: 94g Fat: 40-50g   NUTRITION DIAGNOSIS  NB-1.1 Food and nutrition-related knowledge deficit As related to balance of carbohydrates, protein, and fat related to diabetes and gastroparesis.  As evidenced by diet hx and patient report.   NUTRITION INTERVENTION  Nutrition education (E-1) on the following topics:  Carbohydrate content of foods and portion sizes and recommendation to spread throughout the day Protein sources and recommendation to include with each meal and snack Mindfulness Gastroparesis guidelines, need to have low fiber (even though this contradicts the diabetic diet), chewing food well, pureed or liquids when symptoms worsen, low fat overall.  Handouts Provided Include  Meal plan card Gastroparesis nutrition therapy from [place] [facility] [employer] Teaching method utilized: Visual & Auditory  Demonstrated degree of understanding via: Teach Back  Barriers to learning/adherence to lifestyle change: no teeth or dentures  Goals Breakfast, lunch, and dinner daily.  Avoid skipping meals. (Balanced meals) Always include a protein choice with each  meal (greek yogurt, Glucera, soft meat, milk, eggs) Eat a snack if you are hungry or feel your meal intake has been insufficient (too small). Avoid drinking with meals.  Drink 30 minutes before or 1 hour after eating.  Choose soft foods that you can mash with a fork. When you are more symptomatic, change to pureed foods  and liquids.   Consider reading:  Living well!  With gastroparesis by [employer] Keto, [place] Dietary intake, weekly physical activity, and label reading prn.  Next Steps  Patient is to call for questions.

## 2021-02-04 NOTE — Patient Instructions (Addendum)
Breakfast, lunch, and dinner daily.  Avoid skipping meals. (Balanced meals) Always include a protein choice with each  meal (greek yogurt, Glucera, soft meat, milk, eggs) Eat a snack if you are hungry or feel your meal intake has been insufficient (too small). Avoid drinking with meals.  Drink 30 minutes before or 1 hour after eating.  Choose soft foods that you can mash with a fork. When you are more symptomatic, change to pureed foods and liquids.   Consider reading:  Living well!  With gastroparesis by Lambert Keto, [facility]

## 2021-02-14 ENCOUNTER — Encounter (HOSPITAL_COMMUNITY): Payer: Self-pay

## 2021-02-14 ENCOUNTER — Ambulatory Visit (INDEPENDENT_AMBULATORY_CARE_PROVIDER_SITE_OTHER): Payer: Medicare Other

## 2021-02-14 ENCOUNTER — Ambulatory Visit (HOSPITAL_COMMUNITY)
Admission: EM | Admit: 2021-02-14 | Discharge: 2021-02-14 | Disposition: A | Discharge status: Home / Self Care | Payer: Medicare Other | Attending: Physician Assistant | Admitting: Physician Assistant

## 2021-02-14 DIAGNOSIS — M79671 Pain in right foot: Secondary | ICD-10-CM

## 2021-02-14 DIAGNOSIS — S9031XA Contusion of right foot, initial encounter: Secondary | ICD-10-CM | POA: Diagnosis not present

## 2021-02-14 MED ORDER — NAPROXEN 375 MG PO TABS: 375.0000 mg | ORAL_TABLET | Freq: Two times a day (BID) | ORAL | 0 refills | Status: DC

## 2021-02-14 NOTE — Discharge Instructions (Addendum)
Your x-ray did not show any fracture which is great news.  Please use postop shoe to help provide some support and allow this to heal.  I have called in Naprosyn to help with swelling and some pain.  This is an NSAIDs he should not take additional NSAIDs (aspirin, ibuprofen/Advil, naproxen/Aleve) with this medication as it can cause stomach bleeding.  You can continue Tylenol as well as your prescribed pain medication for additional symptom relief.  Keep your foot elevated and use ice for additional symptom relief.  If you have any worsening symptoms please return for reevaluation.  If symptoms or not improving within a few days of conservative treatment like we discussed, I recommend you follow-up with podiatry.

## 2021-02-14 NOTE — ED Triage Notes (Signed)
Pt reports pain in the right 5th and 4th toe since lats night after her laptop fell over. Hydrocodone gives no relief.

## 2021-02-14 NOTE — ED Provider Notes (Signed)
[place]    CSN: [id number] Arrival date & time: 02/14/21  1225      History   Chief Complaint Chief Complaint  Patient presents with   Foot Pain    HPI Cindy Baker is a 55 y.o. female.   Patient presents today with a 1 day history of right foot pain following injury.  Patient reports that yesterday she had a laptop that fell onto her right foot.  Reports that the laptop fell with the corner and heavy as part landing in a small area at the base of her fourth and fifth toe.  She reports yesterday she was experiencing throbbing pain but that it improved overnight and she is now only experiencing pain with palpation or attempted ambulation/bearing weight.  During these episodes pain is rated 9 on a 0-10 pain scale, localized to MCP joint of the fourth and fifth digit on right foot, described as sharp, no alleviating factors identified.  She does have hydrocodone available and took this at home which allowed her to sleep but did not completely resolve pain.  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   Past Medical History:  Diagnosis Date   Abnormal Pap smear of cervix    Adrenal nodule (HCC)    Allergy    Anemia    Anxiety    Arthritis    osteoarthritis   Chronic gum disease    Depression    Diabetes mellitus without complication ([place])    ENDOMETRIOSIS 08/17/2006   Qualifier: Diagnosis of  By: Samara Snide  .  States no longer has   Fibromyalgia    GERD (gastroesophageal reflux disease)    H. pylori infection    hx of 1 yr ago    Hyperlipidemia    Hypertension    on no meds    IBS (irritable bowel syndrome)    Shortness of breath    related to fibromyalgia   UTI (lower urinary tract infection)     Patient Active Problem List   Diagnosis Date Noted   Cholelithiasis with chronic cholecystitis 10/25/2020   Biliary dyskinesia 10/25/2020   Atherosclerosis of native arteries of extremity with  intermittent claudication ([place]) 02/11/2020   ASCUS with positive high risk HPV cervical 80/88/1103   Helicobacter pylori antibody positive 10/06/2017   Fatty infiltration of liver 10/06/2017   HNP (herniated nucleus pulposus), lumbar 07/25/2012   Hypothyroidism 06/15/2010   OBESITY 06/08/2010   KNEE PAIN 06/08/2010   DYSURIA 06/08/2010   INSOMNIA 02/05/2010   HYPERGLYCEMIA 06/17/2009   ANA POSITIVE 06/17/2009   UNSPECIFIED VITAMIN D DEFICIENCY 06/04/2009   HYPERCHOLESTEROLEMIA 06/04/2009   INCONTINENCE, URGE 06/01/2009   ELEVATED BLOOD PRESSURE 04/29/2009   GENERALIZED ANXIETY DISORDER 04/07/2008   GERD 04/07/2008   TREMOR 04/07/2008   Major depressive disorder, single episode 04/07/2008   RHINITIS, ALLERGIC NOS 03/22/2007   FIBROMYALGIA 11/20/2006   FATIGUE 11/20/2006   TOBACCO DEPENDENCE 08/17/2006   IRRITABLE BOWEL SYNDROME 08/17/2006    Past Surgical History:  Procedure Laterality Date   ADENOIDECTOMY     CHOLECYSTECTOMY N/A 11/02/2020   Procedure: LAPAROSCOPIC CHOLECYSTECTOMY WITH INTRAOPERATIVE CHOLANGIOGRAM;  Surgeon: Armandina Gemma, MD;  Location: [facility];  Service: General;  Laterality: N/A;   DENTAL SURGERY     LUMBAR LAMINECTOMY/DECOMPRESSION MICRODISCECTOMY  07/25/2012   Procedure: LUMBAR LAMINECTOMY/DECOMPRESSION MICRODISCECTOMY 1 LEVEL;  Surgeon: Johnn Hai, MD;  Location: [facility];  Service: Orthopedics;  Laterality: N/A;  MICRODISCECTOMY L5-S1, FORAMINOTOMY L5-S1  TUBAL LIGATION     TYMPANOPLASTY      OB History     Gravida  3   Para  2   Term  2   Preterm      AB  1   Living  2      SAB      IAB  1   Ectopic      Multiple      Live Births               Home Medications    Prior to Admission medications   Medication Sig Start Date End Date Taking? Authorizing Provider  naproxen (NAPROSYN) 375 MG tablet Take 1 tablet (375 mg total) by mouth 2 (two) times daily. 02/14/21  Yes Deby Adger K, PA-C  albuterol (VENTOLIN HFA) 108 (90  Base) MCG/ACT inhaler Inhale 2 puffs into the lungs every 4 (four) hours as needed for wheezing or shortness of breath. 03/18/19   Tresa Garter, MD  Alpha-Lipoic Acid 100 MG CAPS Take by mouth.    [provider]  B-D UF III MINI PEN NEEDLES 31G X 5 MM MISC U BID UTD 08/07/18   [provider]  B-D UF III MINI PEN NEEDLES 31G X 5 MM MISC USE TWICE DAILY AS DIRECTED 02/05/19   Lanae Boast, FNP  Blood Glucose Monitoring Suppl (ACCU-CHEK AVIVA CONNECT) w/Device KIT 1 each by Does not apply route 4 (four) times daily -  before meals and at bedtime. 06/04/18   Lanae Boast, FNP  buPROPion (WELLBUTRIN SR) 150 MG 12 hr tablet Take 1 tablet (150 mg total) by mouth 2 (two) times daily. Take 150 mg for 3 days. Increase to 150 mg twice daily 02/04/19   Lanae Boast, FNP  calcium-vitamin D (OSCAL WITH D) 250-125 MG-UNIT tablet Take 1 tablet by mouth daily.    [provider]  cilostazol (PLETAL) 100 MG tablet Take 1 tablet (100 mg total) by mouth 2 (two) times daily before a meal. 08/18/20   Marty Heck, MD  famotidine (PEPCID) 20 MG tablet Take 20 mg by mouth 2 (two) times daily. 09/01/20   [provider]  fluticasone (FLONASE) 50 MCG/ACT nasal spray Place 2 sprays into both nostrils daily. Patient taking differently: Place 2 sprays into both nostrils daily as needed for allergies. 01/22/18   Lanae Boast, FNP  gabapentin (NEURONTIN) 300 MG capsule Take 1 capsule (300 mg total) by mouth 3 (three) times daily. Patient not taking: Reported on 02/04/2021 02/04/19   Lanae Boast, FNP  glipiZIDE (GLUCOTROL) 10 MG tablet Take 10 mg by mouth 2 (two) times daily before a meal. 03/25/19   [provider]  glucose blood (ACCU-CHEK AVIVA) test strip Three times per day prior to meals and at bedtime. Use as instructed 02/04/19   Lanae Boast, FNP  HYDROcodone-acetaminophen [facility]) 10-325 MG tablet Take 1 tablet by mouth in the morning, at noon, and at bedtime.  10/05/20   [provider]  hydrOXYzine (ATARAX/VISTARIL) 50 MG tablet Take 50-100 mg by mouth in the morning and at bedtime. 12/02/19   [provider]  Lancets (ACCU-CHEK SOFT TOUCH) lancets Use as instructed 02/04/19   Lanae Boast, FNP  levocetirizine (XYZAL) 5 MG tablet Take 1 tablet (5 mg total) by mouth every evening. 01/22/18   Lanae Boast, FNP  loratadine (CLARITIN) 10 MG tablet Take 10 mg by mouth daily. 08/24/20   [provider]  losartan (COZAAR) 25 MG tablet Take 25 mg  by mouth daily. 04/27/20   [provider]  metoCLOPramide (REGLAN) 5 MG tablet Take 5 mg by mouth 4 (four) times daily.    [provider]  omeprazole (PRILOSEC) 20 MG capsule TAKE 1 CAPSULE(20 MG) BY MOUTH TWICE DAILY BEFORE A MEAL FOR 14 DAYS Patient not taking: Reported on 02/04/2021 11/20/17   Dorena Dew, FNP  pantoprazole (PROTONIX) 40 MG tablet Take 40 mg by mouth 2 (two) times daily. 09/01/20   [provider]  rosuvastatin (CRESTOR) 20 MG tablet Take 20 mg by mouth at bedtime. 07/28/20   [provider]  sitaGLIPtin-metformin (JANUMET) 50-1000 MG tablet Take 1 tablet by mouth 2 (two) times daily with a meal. 02/04/19   Lanae Boast, FNP  vitamin B-12 (CYANOCOBALAMIN) 100 MCG tablet Take 100 mcg by mouth daily.    [provider]    Family History Family History  Problem Relation Age of Onset   Cancer Paternal Aunt        cervical   Cancer Maternal Grandmother        breast   Cancer Paternal Grandfather        lung   Lung cancer Father    Esophageal cancer Other    Colon cancer Neg Hx    Colon polyps Neg Hx    Rectal cancer Neg Hx    Stomach cancer Neg Hx     Social History Social History   Tobacco Use   Smoking status: Every Day    Packs/day: 0.50    Years: 13.00    Pack years: 6.50    Types: Cigarettes   Smokeless tobacco: Never  Vaping Use   Vaping Use: Former  Substance Use Topics   Alcohol use: Yes    Comment:  occassionally beer   Drug use: Not Currently    Comment: Not currently using marijuana, last used 2 years ago     Allergies   Neomycin   Review of Systems Review of Systems  Constitutional:  Positive for activity change. Negative for appetite change, fatigue and fever.  Respiratory:  Negative for cough and shortness of breath.   Cardiovascular:  Negative for chest pain.  Gastrointestinal:  Negative for abdominal pain, diarrhea, nausea and vomiting.  Musculoskeletal:  Positive for arthralgias, gait problem and joint swelling. Negative for myalgias.  Neurological:  Positive for numbness (chronic and at baseline). Negative for dizziness, weakness, light-headedness and headaches.    Physical Exam Triage Vital Signs ED Triage Vitals  Enc Vitals Group     BP 02/14/21 1344 (!) 158/76     Pulse Rate 02/14/21 1344 (!) 103     Resp 02/14/21 1344 18     Temp 02/14/21 1344 98.6 F (37 C)     Temp Source 02/14/21 1344 Oral     SpO2 02/14/21 1344 96 %     Weight --      Height --      Head Circumference --      Peak Flow --      Pain Score 02/14/21 1342 9     Pain Loc --      Pain Edu? --      Excl. in [place]? --    No data found.  Updated Vital Signs BP (!) 158/76 (BP Location: Left Arm)   Pulse (!) 103   Temp 98.6 F (37 C) (Oral)   Resp 18   LMP  (LMP Unknown)   SpO2 96%   Visual Acuity Right Eye Distance:  Left Eye Distance:   Bilateral Distance:    Right Eye Near:   Left Eye Near:    Bilateral Near:     Physical Exam   UC Treatments / Results  Labs (all labs ordered are listed, but only abnormal results are displayed) Labs Reviewed - No data to display  EKG   Radiology DG Foot Complete Right  Result Date: 02/14/2021 CLINICAL DATA:  Pain, decreased range of motion following injury. EXAM: RIGHT FOOT COMPLETE - 3+ VIEW COMPARISON:  None. FINDINGS: Osseous alignment is normal. No fracture line or displaced fracture fragment is seen. Incidental note made of  chronic spurring at the plantar surface of the posterior calcaneus, presumably sequela of chronic plantar fasciitis. Soft tissues about the RIGHT foot are unremarkable. IMPRESSION: No acute findings. No fracture or dislocation. Electronically Signed   By: Franki Cabot M.D.   On: 02/14/2021 15:07    Procedures Procedures (including critical care time)  Medications Ordered in UC Medications - No data to display  Initial Impression / Assessment and Plan / UC Course  I have reviewed the triage vital signs and the nursing notes.  Pertinent labs & imaging results that were available during my care of the patient were reviewed by me and considered in my medical decision making (see chart for details).      X-ray showed no acute findings.  Discussed symptoms are likely related to contusion following injury.  Encouraged her to use conservative treatment measures including RICE protocol.  She was placed in a postop shoe for comfort.  Recommended she use Naprosyn twice daily for pain and swelling.  Discussed that she is not to take NSAIDs with this medication due to risk of GI bleeding.  She can use Tylenol as well as her prescribed pain medication for additional pain relief.  Discussed alarm symptoms that warrant emergent evaluation.  Strict return precautions given to which patient expressed understanding.  Discussed if symptoms are not improving with several days of conservative treatment measures she should follow-up with podiatrist and was given contact information for local group should she require follow-up.  Final Clinical Impressions(s) / UC Diagnoses   Final diagnoses:  Right foot pain  Contusion of right foot, initial encounter     Discharge Instructions      Your x-ray did not show any fracture which is great news.  Please use postop shoe to help provide some support and allow this to heal.  I have called in Naprosyn to help with swelling and some pain.  This is an NSAIDs he should not  take additional NSAIDs (aspirin, ibuprofen/Advil, naproxen/Aleve) with this medication as it can cause stomach bleeding.  You can continue Tylenol as well as your prescribed pain medication for additional symptom relief.  Keep your foot elevated and use ice for additional symptom relief.  If you have any worsening symptoms please return for reevaluation.  If symptoms or not improving within a few days of conservative treatment like we discussed, I recommend you follow-up with podiatry.     ED Prescriptions     Medication Sig Dispense Auth. Provider   naproxen (NAPROSYN) 375 MG tablet Take 1 tablet (375 mg total) by mouth 2 (two) times daily. 20 tablet Doneisha Ivey, Derry Skill, PA-C      PDMP not reviewed this encounter.   Terrilee Croak, PA-C 02/14/21 1515

## 2021-03-09 ENCOUNTER — Other Ambulatory Visit: Payer: Self-pay | Admitting: Family

## 2021-03-09 DIAGNOSIS — Z1231 Encounter for screening mammogram for malignant neoplasm of breast: Secondary | ICD-10-CM

## 2021-04-06 ENCOUNTER — Ambulatory Visit
Admission: RE | Admit: 2021-04-06 | Discharge: 2021-04-06 | Disposition: A | Discharge status: Home / Self Care | Payer: Medicare Other | Source: Ambulatory Visit | Attending: Family | Admitting: Family

## 2021-04-06 ENCOUNTER — Other Ambulatory Visit: Payer: Self-pay

## 2021-04-06 DIAGNOSIS — Z1231 Encounter for screening mammogram for malignant neoplasm of breast: Secondary | ICD-10-CM

## 2021-05-05 ENCOUNTER — Other Ambulatory Visit (HOSPITAL_COMMUNITY): Payer: Self-pay | Admitting: Orthopedic Surgery

## 2021-06-29 ENCOUNTER — Encounter (HOSPITAL_BASED_OUTPATIENT_CLINIC_OR_DEPARTMENT_OTHER): Payer: Self-pay | Admitting: Orthopedic Surgery

## 2021-06-29 ENCOUNTER — Other Ambulatory Visit: Payer: Self-pay

## 2021-07-05 ENCOUNTER — Encounter (HOSPITAL_BASED_OUTPATIENT_CLINIC_OR_DEPARTMENT_OTHER)
Admission: RE | Admit: 2021-07-05 | Discharge: 2021-07-05 | Disposition: A | Discharge status: Home / Self Care | Payer: Medicare Other | Source: Ambulatory Visit | Attending: Orthopedic Surgery | Admitting: Orthopedic Surgery

## 2021-07-05 DIAGNOSIS — E039 Hypothyroidism, unspecified: Secondary | ICD-10-CM | POA: Diagnosis not present

## 2021-07-05 DIAGNOSIS — Z01812 Encounter for preprocedural laboratory examination: Secondary | ICD-10-CM | POA: Insufficient documentation

## 2021-07-05 DIAGNOSIS — F418 Other specified anxiety disorders: Secondary | ICD-10-CM | POA: Diagnosis not present

## 2021-07-05 DIAGNOSIS — M1990 Unspecified osteoarthritis, unspecified site: Secondary | ICD-10-CM | POA: Diagnosis not present

## 2021-07-05 DIAGNOSIS — M797 Fibromyalgia: Secondary | ICD-10-CM | POA: Diagnosis not present

## 2021-07-05 DIAGNOSIS — R739 Hyperglycemia, unspecified: Secondary | ICD-10-CM | POA: Diagnosis not present

## 2021-07-05 DIAGNOSIS — G8929 Other chronic pain: Secondary | ICD-10-CM | POA: Diagnosis not present

## 2021-07-05 DIAGNOSIS — Z7984 Long term (current) use of oral hypoglycemic drugs: Secondary | ICD-10-CM | POA: Diagnosis not present

## 2021-07-05 DIAGNOSIS — R0602 Shortness of breath: Secondary | ICD-10-CM | POA: Diagnosis not present

## 2021-07-05 DIAGNOSIS — I10 Essential (primary) hypertension: Secondary | ICD-10-CM | POA: Insufficient documentation

## 2021-07-05 DIAGNOSIS — D649 Anemia, unspecified: Secondary | ICD-10-CM | POA: Diagnosis not present

## 2021-07-05 DIAGNOSIS — K219 Gastro-esophageal reflux disease without esophagitis: Secondary | ICD-10-CM | POA: Diagnosis not present

## 2021-07-05 DIAGNOSIS — F17210 Nicotine dependence, cigarettes, uncomplicated: Secondary | ICD-10-CM | POA: Diagnosis not present

## 2021-07-05 DIAGNOSIS — M2022 Hallux rigidus, left foot: Secondary | ICD-10-CM | POA: Diagnosis not present

## 2021-07-05 DIAGNOSIS — M79672 Pain in left foot: Secondary | ICD-10-CM | POA: Diagnosis present

## 2021-07-05 DIAGNOSIS — E1151 Type 2 diabetes mellitus with diabetic peripheral angiopathy without gangrene: Secondary | ICD-10-CM | POA: Diagnosis not present

## 2021-07-05 LAB — BASIC METABOLIC PANEL
Anion gap: 11 (ref 5–15)
BUN: 12 mg/dL (ref 6–20)
CO2: 22 mmol/L (ref 22–32)
Calcium: 9.7 mg/dL (ref 8.9–10.3)
Chloride: 103 mmol/L (ref 98–111)
Creatinine, Ser: 0.91 mg/dL (ref 0.44–1.00)
GFR, Estimated: >60 mL/min (ref >60)
Glucose, Bld: 229 mg/dL — ABNORMAL HIGH (ref 70–99)
Potassium: 4.1 mmol/L (ref 3.5–5.1)
Sodium: 136 mmol/L (ref 135–145)

## 2021-07-05 NOTE — Progress Notes (Signed)

## 2021-07-08 ENCOUNTER — Ambulatory Visit (HOSPITAL_BASED_OUTPATIENT_CLINIC_OR_DEPARTMENT_OTHER)
Admission: RE | Admit: 2021-07-08 | Discharge: 2021-07-08 | Disposition: A | Discharge status: Home / Self Care | Payer: Medicare Other | Attending: Orthopedic Surgery | Admitting: Orthopedic Surgery

## 2021-07-08 ENCOUNTER — Encounter (HOSPITAL_BASED_OUTPATIENT_CLINIC_OR_DEPARTMENT_OTHER): Payer: Self-pay | Admitting: Orthopedic Surgery

## 2021-07-08 ENCOUNTER — Ambulatory Visit (HOSPITAL_BASED_OUTPATIENT_CLINIC_OR_DEPARTMENT_OTHER): Payer: Medicare Other | Admitting: Anesthesiology

## 2021-07-08 ENCOUNTER — Encounter (HOSPITAL_BASED_OUTPATIENT_CLINIC_OR_DEPARTMENT_OTHER)
Admission: RE | Disposition: A | Discharge status: Home / Self Care | Payer: Self-pay | Source: Home / Self Care | Attending: Orthopedic Surgery

## 2021-07-08 ENCOUNTER — Other Ambulatory Visit: Payer: Self-pay

## 2021-07-08 DIAGNOSIS — G8929 Other chronic pain: Secondary | ICD-10-CM | POA: Insufficient documentation

## 2021-07-08 DIAGNOSIS — M797 Fibromyalgia: Secondary | ICD-10-CM | POA: Insufficient documentation

## 2021-07-08 DIAGNOSIS — R0602 Shortness of breath: Secondary | ICD-10-CM | POA: Diagnosis not present

## 2021-07-08 DIAGNOSIS — F17210 Nicotine dependence, cigarettes, uncomplicated: Secondary | ICD-10-CM | POA: Insufficient documentation

## 2021-07-08 DIAGNOSIS — E1151 Type 2 diabetes mellitus with diabetic peripheral angiopathy without gangrene: Secondary | ICD-10-CM | POA: Insufficient documentation

## 2021-07-08 DIAGNOSIS — E039 Hypothyroidism, unspecified: Secondary | ICD-10-CM | POA: Insufficient documentation

## 2021-07-08 DIAGNOSIS — D649 Anemia, unspecified: Secondary | ICD-10-CM | POA: Insufficient documentation

## 2021-07-08 DIAGNOSIS — I10 Essential (primary) hypertension: Secondary | ICD-10-CM | POA: Insufficient documentation

## 2021-07-08 DIAGNOSIS — M2022 Hallux rigidus, left foot: Secondary | ICD-10-CM | POA: Insufficient documentation

## 2021-07-08 DIAGNOSIS — M1990 Unspecified osteoarthritis, unspecified site: Secondary | ICD-10-CM | POA: Insufficient documentation

## 2021-07-08 DIAGNOSIS — R7309 Other abnormal glucose: Secondary | ICD-10-CM

## 2021-07-08 DIAGNOSIS — F418 Other specified anxiety disorders: Secondary | ICD-10-CM | POA: Insufficient documentation

## 2021-07-08 DIAGNOSIS — Z7984 Long term (current) use of oral hypoglycemic drugs: Secondary | ICD-10-CM | POA: Insufficient documentation

## 2021-07-08 DIAGNOSIS — R030 Elevated blood-pressure reading, without diagnosis of hypertension: Secondary | ICD-10-CM

## 2021-07-08 DIAGNOSIS — K219 Gastro-esophageal reflux disease without esophagitis: Secondary | ICD-10-CM | POA: Insufficient documentation

## 2021-07-08 HISTORY — DX: Gastroparesis: K31.84

## 2021-07-08 HISTORY — DX: Type 2 diabetes mellitus with diabetic neuropathy, unspecified: E11.40

## 2021-07-08 HISTORY — DX: Other specified disorders of bone density and structure, unspecified site: M85.80

## 2021-07-08 LAB — GLUCOSE, CAPILLARY
Glucose-Capillary: 192 mg/dL — ABNORMAL HIGH (ref 70–99)
Glucose-Capillary: 189 mg/dL — ABNORMAL HIGH (ref 70–99)

## 2021-07-08 SURGERY — CHEILECTOMY, GREAT TOE, WITH IMPLANT INSERTION
Anesthesia: General | Site: Foot | Laterality: Left

## 2021-07-08 MED ORDER — FENTANYL CITRATE (PF) 100 MCG/2ML IJ SOLN
100.0000 ug | Freq: Once | INTRAMUSCULAR | Status: AC
  Administered 2021-07-08: 100 ug via INTRAVENOUS

## 2021-07-08 MED ORDER — PROPOFOL 10 MG/ML IV BOLUS
INTRAVENOUS | Status: DC | PRN
Start: 2021-07-08 — End: 2021-07-08
  Administered 2021-07-08: 150 mg via INTRAVENOUS

## 2021-07-08 MED ORDER — PROPOFOL 500 MG/50ML IV EMUL
INTRAVENOUS | Status: DC | PRN
  Administered 2021-07-08: 25 ug/kg/min via INTRAVENOUS

## 2021-07-08 MED ORDER — DOCUSATE SODIUM 100 MG PO CAPS: 100.0000 mg | ORAL_CAPSULE | Freq: Two times a day (BID) | ORAL | 0 refills | Status: DC

## 2021-07-08 MED ORDER — BUPIVACAINE-EPINEPHRINE (PF) 0.5% -1:200000 IJ SOLN
INTRAMUSCULAR | Status: DC | PRN
  Administered 2021-07-08: 30 mL via PERINEURAL

## 2021-07-08 MED ORDER — HYDROMORPHONE HCL 1 MG/ML IJ SOLN: 0.2500 mg | INTRAMUSCULAR | Status: DC | PRN

## 2021-07-08 MED ORDER — OXYCODONE HCL 5 MG PO TABS
5.0000 mg | ORAL_TABLET | ORAL | 0 refills | Status: AC | PRN
Start: 2021-07-08 — End: 2021-07-11

## 2021-07-08 MED ORDER — MIDAZOLAM HCL 2 MG/2ML IJ SOLN
2.0000 mg | Freq: Once | INTRAMUSCULAR | Status: AC
  Administered 2021-07-08: 2 mg via INTRAVENOUS

## 2021-07-08 MED ORDER — VANCOMYCIN HCL 500 MG IV SOLR
INTRAVENOUS | Status: AC
  Filled 2021-07-08: qty 50

## 2021-07-08 MED ORDER — VANCOMYCIN HCL 500 MG IV SOLR
INTRAVENOUS | Status: DC | PRN
  Administered 2021-07-08: 500 mg via TOPICAL

## 2021-07-08 MED ORDER — CEFAZOLIN SODIUM-DEXTROSE 2-4 GM/100ML-% IV SOLN
INTRAVENOUS | Status: AC
  Filled 2021-07-08: qty 100

## 2021-07-08 MED ORDER — 0.9 % SODIUM CHLORIDE (POUR BTL) OPTIME
TOPICAL | Status: DC | PRN
  Administered 2021-07-08: 150 mL

## 2021-07-08 MED ORDER — SODIUM CHLORIDE 0.9 % IV SOLN: INTRAVENOUS | Status: DC

## 2021-07-08 MED ORDER — SENNA 8.6 MG PO TABS: 2.0000 | ORAL_TABLET | Freq: Two times a day (BID) | ORAL | 0 refills | Status: AC

## 2021-07-08 MED ORDER — LIDOCAINE 2% (20 MG/ML) 5 ML SYRINGE
INTRAMUSCULAR | Status: DC | PRN
  Administered 2021-07-08: 30 mg via INTRAVENOUS

## 2021-07-08 MED ORDER — ACETAMINOPHEN 500 MG PO TABS
ORAL_TABLET | ORAL | Status: AC
  Filled 2021-07-08: qty 2

## 2021-07-08 MED ORDER — MIDAZOLAM HCL 2 MG/2ML IJ SOLN
INTRAMUSCULAR | Status: AC
  Filled 2021-07-08: qty 2

## 2021-07-08 MED ORDER — FENTANYL CITRATE (PF) 100 MCG/2ML IJ SOLN
INTRAMUSCULAR | Status: AC
  Filled 2021-07-08: qty 2

## 2021-07-08 MED ORDER — ACETAMINOPHEN 500 MG PO TABS
1000.0000 mg | ORAL_TABLET | Freq: Once | ORAL | Status: AC
  Administered 2021-07-08: 1000 mg via ORAL

## 2021-07-08 MED ORDER — ONDANSETRON HCL 4 MG/2ML IJ SOLN
INTRAMUSCULAR | Status: DC | PRN
  Administered 2021-07-08: 4 mg via INTRAVENOUS

## 2021-07-08 MED ORDER — CEFAZOLIN SODIUM-DEXTROSE 2-4 GM/100ML-% IV SOLN
2.0000 g | INTRAVENOUS | Status: AC
  Administered 2021-07-08: 2 g via INTRAVENOUS

## 2021-07-08 MED ORDER — LACTATED RINGERS IV SOLN: INTRAVENOUS | Status: DC

## 2021-07-08 SURGICAL SUPPLY — 61 items
APL PRP STRL LF DISP 70% ISPRP (MISCELLANEOUS) ×1
BANDAGE ESMARK 6X9 LF (GAUZE/BANDAGES/DRESSINGS) IMPLANT
BLADE SURG 15 STRL LF DISP TIS (BLADE) ×2 IMPLANT
BLADE SURG 15 STRL SS (BLADE) ×4
BNDG CMPR 9X4 STRL LF SNTH (GAUZE/BANDAGES/DRESSINGS)
BNDG CMPR 9X6 STRL LF SNTH (GAUZE/BANDAGES/DRESSINGS)
BNDG COHESIVE 4X5 TAN ST LF (GAUZE/BANDAGES/DRESSINGS) ×1 IMPLANT
BNDG CONFORM 2 STRL LF (GAUZE/BANDAGES/DRESSINGS) IMPLANT
BNDG CONFORM 3 STRL LF (GAUZE/BANDAGES/DRESSINGS) ×2 IMPLANT
BNDG ELASTIC 4X5.8 VLCR STR LF (GAUZE/BANDAGES/DRESSINGS) ×1 IMPLANT
BNDG ESMARK 4X9 LF (GAUZE/BANDAGES/DRESSINGS) IMPLANT
BNDG ESMARK 6X9 LF (GAUZE/BANDAGES/DRESSINGS)
CHLORAPREP W/TINT 26 (MISCELLANEOUS) ×2 IMPLANT
COVER BACK TABLE 60X90IN (DRAPES) ×2 IMPLANT
CUFF TOURN SGL QUICK 34 (TOURNIQUET CUFF)
CUFF TRNQT CYL 34X4.125X (TOURNIQUET CUFF) IMPLANT
DRAPE EXTREMITY T 121X128X90 (DISPOSABLE) ×2 IMPLANT
DRAPE OEC MINIVIEW 54X84 (DRAPES) IMPLANT
DRAPE SURG 17X23 STRL (DRAPES) IMPLANT
DRAPE U-SHAPE 47X51 STRL (DRAPES) ×2 IMPLANT
DRSG MEPITEL 4X7.2 (GAUZE/BANDAGES/DRESSINGS) ×2 IMPLANT
DRSG PAD ABDOMINAL 8X10 ST (GAUZE/BANDAGES/DRESSINGS) ×2 IMPLANT
ELECT REM PT RETURN 9FT ADLT (ELECTROSURGICAL) ×2
ELECTRODE REM PT RTRN 9FT ADLT (ELECTROSURGICAL) ×1 IMPLANT
GAUZE SPONGE 4X4 12PLY STRL (GAUZE/BANDAGES/DRESSINGS) ×2 IMPLANT
GLOVE SRG 8 PF TXTR STRL LF DI (GLOVE) ×2 IMPLANT
GLOVE SURG ENC MOIS LTX SZ8 (GLOVE) ×2 IMPLANT
GLOVE SURG LTX SZ8 (GLOVE) ×2 IMPLANT
GLOVE SURG POLYISO LF SZ7 (GLOVE) ×1 IMPLANT
GLOVE SURG UNDER POLY LF SZ7 (GLOVE) ×2 IMPLANT
GLOVE SURG UNDER POLY LF SZ8 (GLOVE) ×4
GOWN STRL REUS W/ TWL LRG LVL3 (GOWN DISPOSABLE) ×1 IMPLANT
GOWN STRL REUS W/ TWL XL LVL3 (GOWN DISPOSABLE) ×2 IMPLANT
GOWN STRL REUS W/TWL LRG LVL3 (GOWN DISPOSABLE) ×2
GOWN STRL REUS W/TWL XL LVL3 (GOWN DISPOSABLE) ×4
IMPL MTP CARTIVA 8MM (Orthopedic Implant) IMPLANT
IMPLANT MTP CARTIVA 8MM (Orthopedic Implant) ×2 IMPLANT
NDL HYPO 25X1 1.5 SAFETY (NEEDLE) IMPLANT
NEEDLE HYPO 25X1 1.5 SAFETY (NEEDLE) IMPLANT
NS IRRIG 1000ML POUR BTL (IV SOLUTION) ×2 IMPLANT
PACK BASIN DAY SURGERY FS (CUSTOM PROCEDURE TRAY) ×2 IMPLANT
PAD CAST 4YDX4 CTTN HI CHSV (CAST SUPPLIES) ×1 IMPLANT
PADDING CAST COTTON 4X4 STRL (CAST SUPPLIES) ×2
PENCIL SMOKE EVACUATOR (MISCELLANEOUS) ×2 IMPLANT
SANITIZER HAND PURELL 535ML FO (MISCELLANEOUS) ×2 IMPLANT
SHEET MEDIUM DRAPE 40X70 STRL (DRAPES) ×2 IMPLANT
SLEEVE SCD COMPRESS KNEE MED (STOCKING) ×2 IMPLANT
SPONGE T-LAP 18X18 ~~LOC~~+RFID (SPONGE) ×2 IMPLANT
STOCKINETTE 6  STRL (DRAPES) ×2
STOCKINETTE 6 STRL (DRAPES) ×1 IMPLANT
SUCTION FRAZIER HANDLE 10FR (MISCELLANEOUS) ×2
SUCTION TUBE FRAZIER 10FR DISP (MISCELLANEOUS) ×1 IMPLANT
SUT ETHILON 3 0 PS 1 (SUTURE) ×2 IMPLANT
SUT MNCRL AB 3-0 PS2 18 (SUTURE) ×2 IMPLANT
SUT VIC AB 2-0 SH 27 (SUTURE) ×2
SUT VIC AB 2-0 SH 27XBRD (SUTURE) ×1 IMPLANT
SYR BULB EAR ULCER 3OZ GRN STR (SYRINGE) ×2 IMPLANT
SYR CONTROL 10ML LL (SYRINGE) IMPLANT
TOWEL GREEN STERILE FF (TOWEL DISPOSABLE) ×2 IMPLANT
TUBE CONNECTING 20X1/4 (TUBING) ×2 IMPLANT
UNDERPAD 30X36 HEAVY ABSORB (UNDERPADS AND DIAPERS) ×2 IMPLANT

## 2021-07-08 NOTE — Op Note (Signed)
07/08/2021  8:18 AM  PATIENT:  Cindy Baker  56 y.o. female  PRE-OPERATIVE DIAGNOSIS:  Left foot hallux rigidus  POST-OPERATIVE DIAGNOSIS:  Left foot hallux rigidus  Procedure(s):  Left foot hallux metatarsophalangeal joint cheilectomy with joint resurfacing (8 mm Cartiva)  SURGEON:  Wylene Simmer, MD  ASSISTANT: Mechele Claude, PA-C  ANESTHESIA:   General, regional  EBL:  minimal   TOURNIQUET:   Total Tourniquet Time Documented: Thigh (Left) - 16 minutes Total: Thigh (Left) - 16 minutes   COMPLICATIONS:  None apparent  DISPOSITION:  Extubated, awake and stable to recovery.  INDICATION FOR PROCEDURE: The patient is a 56 year old female with a past medical history significant for diabetes and chronic pain.  She has a long history of left forefoot pain due to hallux rigidus.  She has failed nonoperative treatment and presents today for surgical correction of this painful left forefoot condition.  The risks and benefits of the alternative treatment options have been discussed in detail.  The patient wishes to proceed with surgery and specifically understands risks of bleeding, infection, nerve damage, blood clots, need for additional surgery, amputation and death.   PROCEDURE IN DETAIL:  After pre operative consent was obtained, and the correct operative site was identified, the patient was brought to the operating room and placed supine on the OR table.  Anesthesia was administered.  Pre-operative antibiotics were administered.  A surgical timeout was taken.  The left lower extremity was prepped and draped in standard sterile fashion with a tourniquet around the thigh.  The extremity was elevated and the tourniquet was inflated to 250 mmHg.  A longitudinal incision was made over the left hallux MP joint.  Dissection was carried sharply down through the subcutaneous tissues.  The EHB and EHL tendons were retracted and protected throughout the case.  The collateral ligaments were released  and the metatarsal head exposed.  There was full-thickness cartilage loss over the dorsal lateral one third and significant degenerative changes of the remaining articular cartilage.  All unstable cartilage was removed with a rondure.  A guidepin was then inserted into the metatarsal head just dorsal and lateral to the midpoint.  An 8 mm reamer was then advanced over the guidewire.  The wound was irrigated copiously and all bone debris removed.  The 8 mm Cartiva implant was inserted into the socket and was noted to fit appropriately.  The joint was reduced.  Dorsiflexion was approximately 75 degrees and plantar flexion was 60 degrees.  The wound was irrigated copiously.  The dorsal joint capsule was repaired with 2-0 Vicryl.  Subcutaneous tissues were approximated with 3-0 Monocryl.  The skin incision was closed with 3-0 nylon.  Sterile dressings were applied followed by a compression wrap.  The tourniquet was released after application of the dressings.  The patient was awakened from anesthesia and transported to the recovery room in stable condition.   FOLLOW UP PLAN: Weightbearing as tolerated in a flat postop shoe.  Follow-up in the office in 2 weeks for suture removal and early range of motion with active dorsiflexion and plantarflexion.  Plan 6 weeks postoperative immobilization.     Mechele Claude PA-C was present and scrubbed for the duration of the operative case. His assistance was essential in positioning the patient, prepping and draping, gaining and maintaining exposure, performing the operation, closing and dressing the wounds and applying the splint.

## 2021-07-08 NOTE — Anesthesia Preprocedure Evaluation (Addendum)
Anesthesia Evaluation  Patient identified by MRN, date of birth, ID band Patient awake    Reviewed: Allergy & Precautions, H&P , NPO status , Patient's Chart, lab work & pertinent test results  Airway Mallampati: III  TM Distance: >3 FB Neck ROM: Full    Dental no notable dental hx. (+) Edentulous Upper, Edentulous Lower, Dental Advisory Given   Pulmonary shortness of breath, Current Smoker and Patient abstained from smoking.,    Pulmonary exam normal breath sounds clear to auscultation       Cardiovascular hypertension, Pt. on medications + Peripheral Vascular Disease   Rhythm:Regular Rate:Normal     Neuro/Psych Anxiety Depression negative neurological ROS     GI/Hepatic Neg liver ROS, GERD  Medicated,  Endo/Other  diabetes, Type 2, Oral Hypoglycemic AgentsHypothyroidism   Renal/GU negative Renal ROS  negative genitourinary   Musculoskeletal  (+) Arthritis , Osteoarthritis,  Fibromyalgia -  Abdominal   Peds  Hematology  (+) Blood dyscrasia, anemia ,   Anesthesia Other Findings   Reproductive/Obstetrics negative OB ROS                            Anesthesia Physical Anesthesia Plan  ASA: 3  Anesthesia Plan: General   Post-op Pain Management: Regional block and Tylenol PO (pre-op)   Induction: Intravenous  PONV Risk Score and Plan: 3 and Ondansetron, Dexamethasone and Midazolam  Airway Management Planned: LMA  Additional Equipment:   Intra-op Plan:   Post-operative Plan: Extubation in OR  Informed Consent: I have reviewed the patients History and Physical, chart, labs and discussed the procedure including the risks, benefits and alternatives for the proposed anesthesia with the patient or authorized representative who has indicated his/her understanding and acceptance.     Dental advisory given  Plan Discussed with: CRNA  Anesthesia Plan Comments:          Anesthesia Quick Evaluation

## 2021-07-08 NOTE — Transfer of Care (Signed)
Immediate Anesthesia Transfer of Care Note  Patient: Cindy Baker  Procedure(s) Performed: Left foot hallux metatarsophalangeal joint cheilectomy with joint resurfacing (Left: Foot)  Patient Location: PACU  Anesthesia Type:GA combined with regional for post-op pain  Level of Consciousness: awake, alert , oriented and patient cooperative  Airway & Oxygen Therapy: Patient Spontanous Breathing and Patient connected to face mask oxygen  Post-op Assessment: Report given to RN and Post -op Vital signs reviewed and stable  Post vital signs: Reviewed and stable  Last Vitals:  Vitals Value Taken Time  BP    Temp    Pulse    Resp    SpO2      Last Pain:  Vitals:   07/08/21 0629  TempSrc: Oral  PainSc: 5       Patients Stated Pain Goal: 5 (29/09/03 0149)  Complications: No notable events documented.

## 2021-07-08 NOTE — Anesthesia Procedure Notes (Signed)
Anesthesia Regional Block: Popliteal block   Pre-Anesthetic Checklist: , timeout performed,  Correct Patient, Correct Site, Correct Laterality,  Correct Procedure, Correct Position, site marked,  Risks and benefits discussed,  Pre-op evaluation,  At surgeon's request and post-op pain management  Laterality: Left  Prep: Maximum Sterile Barrier Precautions used, chloraprep       Needles:  Injection technique: Single-shot  Needle Type: Echogenic Stimulator Needle     Needle Length: 9cm  Needle Gauge: 21     Additional Needles:   Procedures:,,,, ultrasound used (permanent image in chart),,    Narrative:  Start time: 07/08/2021 6:56 AM End time: 07/08/2021 7:06 AM Injection made incrementally with aspirations every 5 mL. Anesthesiologist: Roderic Palau, MD

## 2021-07-08 NOTE — H&P (Signed)
Cindy Baker is an 56 y.o. female.   Chief Complaint: Left foot pain HPI: 56 year old female has a long history of left forefoot pain due to hallux rigidus.  Her past medical history is significant for diabetes.  She has failed nonoperative treatment to date including activity modification, oral anti-inflammatories and shoewear modification.  She presents today for surgical treatment of this painful left forefoot condition.  Past Medical History:  Diagnosis Date   Abnormal Pap smear of cervix    Adrenal nodule (HCC)    Allergy    Anemia    Anxiety    Arthritis    osteoarthritis   Chronic gum disease    Depression    Diabetes mellitus without complication ([place])    Diabetic neuropathy ([place])    ENDOMETRIOSIS 08/17/2006   Qualifier: Diagnosis of  By: Samara Snide  .  States no longer has   Fibromyalgia    Gastroparesis    GERD (gastroesophageal reflux disease)    H. pylori infection    hx of 1 yr ago    Hyperlipidemia    Hypertension    on no meds    IBS (irritable bowel syndrome)    Osteopenia    Shortness of breath    related to fibromyalgia   UTI (lower urinary tract infection)     Past Surgical History:  Procedure Laterality Date   ADENOIDECTOMY     CHOLECYSTECTOMY N/A 11/02/2020   Procedure: LAPAROSCOPIC CHOLECYSTECTOMY WITH INTRAOPERATIVE CHOLANGIOGRAM;  Surgeon: Armandina Gemma, MD;  Location: [facility];  Service: General;  Laterality: N/A;   DENTAL SURGERY     LUMBAR LAMINECTOMY/DECOMPRESSION MICRODISCECTOMY  07/25/2012   Procedure: LUMBAR LAMINECTOMY/DECOMPRESSION MICRODISCECTOMY 1 LEVEL;  Surgeon: Johnn Hai, MD;  Location: [facility];  Service: Orthopedics;  Laterality: N/A;  MICRODISCECTOMY L5-S1, FORAMINOTOMY L5-S1   TUBAL LIGATION     TYMPANOPLASTY      Family History  Problem Relation Age of Onset   Lung cancer Father    Cancer Paternal Aunt        cervical   Breast cancer Maternal Grandmother        34s   Cancer Maternal Grandmother        breast    Cancer Paternal Grandfather        lung   Esophageal cancer Other    Colon cancer Neg Hx    Colon polyps Neg Hx    Rectal cancer Neg Hx    Stomach cancer Neg Hx    Social History:  reports that she has been smoking cigarettes. She has a 6.50 pack-year smoking history. She has never used smokeless tobacco. She reports current alcohol use. She reports that she does not currently use drugs.  Allergies:  Allergies  Allergen Reactions   Neomycin Other (See Comments)    Eyes swelling    Medications Prior to Admission  Medication Sig Dispense Refill   albuterol (VENTOLIN HFA) 108 (90 Base) MCG/ACT inhaler Inhale 2 puffs into the lungs every 4 (four) hours as needed for wheezing or shortness of breath. 17 g 2   Alpha-Lipoic Acid 100 MG CAPS Take by mouth.     B-D UF III MINI PEN NEEDLES 31G X 5 MM MISC U BID UTD     B-D UF III MINI PEN NEEDLES 31G X 5 MM MISC USE TWICE DAILY AS DIRECTED 100 each 3   Blood Glucose Monitoring Suppl (ACCU-CHEK AVIVA CONNECT) w/Device KIT 1 each by Does not apply route 4 (four) times daily -  before meals and at bedtime. 1 kit 0   calcium-vitamin D (OSCAL WITH D) 250-125 MG-UNIT tablet Take 1 tablet by mouth daily.     cilostazol (PLETAL) 100 MG tablet Take 1 tablet (100 mg total) by mouth 2 (two) times daily before a meal. 60 tablet 11   famotidine (PEPCID) 20 MG tablet Take 20 mg by mouth 2 (two) times daily.     glipiZIDE (GLUCOTROL) 10 MG tablet Take 10 mg by mouth 2 (two) times daily before a meal.     glucose blood (ACCU-CHEK AVIVA) test strip Three times per day prior to meals and at bedtime. Use as instructed 100 each 12   HYDROcodone-acetaminophen (NORCO) 10-325 MG tablet Take 1 tablet by mouth in the morning, at noon, and at bedtime.     hydrOXYzine (ATARAX/VISTARIL) 50 MG tablet Take 50-100 mg by mouth in the morning and at bedtime.     Lancets (ACCU-CHEK SOFT TOUCH) lancets Use as instructed 100 each 12   losartan (COZAAR) 25 MG tablet Take 25 mg by  mouth daily.     metoCLOPramide (REGLAN) 5 MG tablet Take 5 mg by mouth 3 (three) times daily before meals.     Multiple Vitamin (MULTIVITAMIN ADULT PO) Take by mouth.     pantoprazole (PROTONIX) 40 MG tablet Take 40 mg by mouth 2 (two) times daily.     rosuvastatin (CRESTOR) 20 MG tablet Take 20 mg by mouth at bedtime.     sitaGLIPtin-metformin (JANUMET) 50-1000 MG tablet Take 1 tablet by mouth 2 (two) times daily with a meal. 60 tablet 5   traZODone (DESYREL) 50 MG tablet Take 50 mg by mouth at bedtime.     vitamin B-12 (CYANOCOBALAMIN) 100 MCG tablet Take 100 mcg by mouth daily.     fluticasone (FLONASE) 50 MCG/ACT nasal spray Place 2 sprays into both nostrils daily. (Patient taking differently: Place 2 sprays into both nostrils daily as needed for allergies.) 16 g 6   levocetirizine (XYZAL) 5 MG tablet Take 1 tablet (5 mg total) by mouth every evening. 30 tablet 2   loratadine (CLARITIN) 10 MG tablet Take 10 mg by mouth daily.      Results for orders placed or performed during the hospital encounter of 07/08/21 (from the past 48 hour(s))  Glucose, capillary     Status: Abnormal   Collection Time: 07/08/21  6:30 AM  Result Value Ref Range   Glucose-Capillary 192 (H) 70 - 99 mg/dL    Comment: Glucose reference range applies only to samples taken after fasting for at least 8 hours.   No results found.  Review of Systems no recent fever, chills, nausea, vomiting or changes in her appetite  Blood pressure 93/65, pulse 73, temperature (!) 97.1 F (36.2 C), temperature source Oral, resp. rate 16, height $RemoveBe'5\' 4"'savCmjVRn$  (1.626 m), weight 80.8 kg, SpO2 98 %. Physical Exam  Well-nourished well-developed woman in no apparent distress.  Alert and oriented x4.  Normal mood and affect.  Gait is antalgic to the left.  Left foot has decreased range of motion of the hallux MP joint.  Skin is healthy and intact.  Pulses are palpable.  No lymphadenopathy.  5 out of 5 strength in plantarflexion and dorsiflexion of  the ankle and toes.   Assessment/Plan Left hallux rigidus -to the operating room today for hallux MP joint cheilectomy and resurfacing.  The risks and benefits of the alternative treatment options have been discussed in detail.  The patient wishes to proceed with  surgery and specifically understands risks of bleeding, infection, nerve damage, blood clots, need for additional surgery, amputation and death.   Wylene Simmer, MD Jul 24, 2021, 7:22 AM

## 2021-07-08 NOTE — Anesthesia Procedure Notes (Signed)
Procedure Name: LMA Insertion Date/Time: 07/08/2021 7:37 AM Performed by: Signe Colt, CRNA Pre-anesthesia Checklist: Patient identified, Emergency Drugs available, Suction available and Patient being monitored Patient Re-evaluated:Patient Re-evaluated prior to induction Oxygen Delivery Method: Circle System Utilized Preoxygenation: Pre-oxygenation with 100% oxygen Induction Type: IV induction Ventilation: Mask ventilation without difficulty LMA: LMA inserted LMA Size: 4.0 Number of attempts: 1 Airway Equipment and Method: bite block Placement Confirmation: positive ETCO2 Tube secured with: Tape Dental Injury: Teeth and Oropharynx as per pre-operative assessment

## 2021-07-08 NOTE — Anesthesia Postprocedure Evaluation (Signed)
Anesthesia Post Note  Patient: Cindy Baker  Procedure(s) Performed: Left foot hallux metatarsophalangeal joint cheilectomy with joint resurfacing (Left: Foot)     Patient location during evaluation: PACU Anesthesia Type: General Level of consciousness: awake and alert Pain management: pain level controlled Vital Signs Assessment: post-procedure vital signs reviewed and stable Respiratory status: spontaneous breathing, nonlabored ventilation and respiratory function stable Cardiovascular status: blood pressure returned to baseline and stable Postop Assessment: no apparent nausea or vomiting Anesthetic complications: no   No notable events documented.  Last Vitals:  Vitals:   07/08/21 0815 07/08/21 0830  BP: 116/68 132/67  Pulse: 89 82  Resp: 15 13  Temp:    SpO2: 100% 100%    Last Pain:  Vitals:   07/08/21 0852  TempSrc:   PainSc: 0-No pain                 Tulip Meharg,W. EDMOND

## 2021-07-08 NOTE — Progress Notes (Signed)
Assisted Dr. Oren Bracket with left, ultrasound guided, popliteal block. Side rails up, monitors on throughout procedure. See vital signs in flow sheet. Tolerated Procedure well.

## 2021-07-08 NOTE — Discharge Instructions (Addendum)
Cindy Simmer, MD [facility]  Please read the following information regarding your care after surgery.  Medications  You only need a prescription for the narcotic pain medicine (ex. oxycodone, Percocet, Norco).  All of the other medicines listed below are available over the counter. ? Aleve 2 pills twice a day for the first 3 days after surgery. ? acetominophen (Tylenol) 650 mg every 4-6 hours as you need for minor to moderate pain ? oxycodone as prescribed for severe pain  Narcotic pain medicine (ex. oxycodone, Percocet, Vicodin) will cause constipation.  To prevent this problem, take the following medicines while you are taking any pain medicine. ? docusate sodium (Colace) 100 mg twice a day ? senna (Senokot) 2 tablets twice a day  Weight Bearing ? Bear weight only on your operated foot in the post-op shoe.   Cast / Splint / Dressing ? Keep your splint, cast or dressing clean and dry.  Dont put anything (coat hanger, pencil, etc) down inside of it.  If it gets damp, use a hair dryer on the cool setting to dry it.  If it gets soaked, call the office to schedule an appointment for a cast change.    After your dressing, cast or splint is removed; you may shower, but do not soak or scrub the wound.  Allow the water to run over it, and then gently pat it dry.  Swelling It is normal for you to have swelling where you had surgery.  To reduce swelling and pain, keep your toes above your nose for at least 3 days after surgery.  It may be necessary to keep your foot or leg elevated for several weeks.  If it hurts, it should be elevated.  Follow Up Call my office at [phone] when you are discharged from the hospital or surgery center to schedule an appointment to be seen two weeks after surgery.  Call my office at [phone] if you develop a fever >101.5 F, nausea, vomiting, bleeding from the surgical site or severe pain.    No Tylenol until after 1pm today if needed  Post Anesthesia  Home Care Instructions  Activity: Get plenty of rest for the remainder of the day. A responsible individual must stay with you for 24 hours following the procedure.  For the next 24 hours, DO NOT: -Drive a car -Paediatric nurse -Drink alcoholic beverages -Take any medication unless instructed by your physician -Make any legal decisions or sign important papers.  Meals: Start with liquid foods such as gelatin or soup. Progress to regular foods as tolerated. Avoid greasy, spicy, heavy foods. If nausea and/or vomiting occur, drink only clear liquids until the nausea and/or vomiting subsides. Call your physician if vomiting continues.  Special Instructions/Symptoms: Your throat may feel dry or sore from the anesthesia or the breathing tube placed in your throat during surgery. If this causes discomfort, gargle with warm salt water. The discomfort should disappear within 24 hours.  If you had a scopolamine patch placed behind your ear for the management of post- operative nausea and/or vomiting:  1. The medication in the patch is effective for 72 hours, after which it should be removed.  Wrap patch in a tissue and discard in the trash. Wash hands thoroughly with soap and water. 2. You may remove the patch earlier than 72 hours if you experience unpleasant side effects which may include dry mouth, dizziness or visual disturbances. 3. Avoid touching the patch. Wash your hands with soap and water after contact with the  patch.     Regional Anesthesia Blocks  1. Numbness or the inability to move the "blocked" extremity may last from 3-48 hours after placement. The length of time depends on the medication injected and your individual response to the medication. If the numbness is not going away after 48 hours, call your surgeon.  2. The extremity that is blocked will need to be protected until the numbness is gone and the  Strength has returned. Because you cannot feel it, you will need to take  extra care to avoid injury. Because it may be weak, you may have difficulty moving it or using it. You may not know what position it is in without looking at it while the block is in effect.  3. For blocks in the legs and feet, returning to weight bearing and walking needs to be done carefully. You will need to wait until the numbness is entirely gone and the strength has returned. You should be able to move your leg and foot normally before you try and bear weight or walk. You will need someone to be with you when you first try to ensure you do not fall and possibly risk injury.  4. Bruising and tenderness at the needle site are common side effects and will resolve in a few days.  5. Persistent numbness or new problems with movement should be communicated to the surgeon or the [facility] [phone] [place] [phone]).

## 2021-07-09 NOTE — Progress Notes (Signed)
Left message stating courtesy call and if any questions or concerns please call the doctors office.  

## 2021-08-30 ENCOUNTER — Other Ambulatory Visit: Payer: Self-pay | Admitting: *Deleted

## 2021-08-30 DIAGNOSIS — I70211 Atherosclerosis of native arteries of extremities with intermittent claudication, right leg: Secondary | ICD-10-CM

## 2021-09-14 ENCOUNTER — Ambulatory Visit: Payer: Medicare Other

## 2021-09-14 ENCOUNTER — Encounter (HOSPITAL_COMMUNITY): Payer: Medicare Other

## 2021-09-19 ENCOUNTER — Other Ambulatory Visit: Payer: Self-pay | Admitting: Vascular Surgery

## 2021-10-12 ENCOUNTER — Ambulatory Visit: Payer: Medicare Other | Admitting: Vascular Surgery

## 2021-10-12 ENCOUNTER — Encounter (HOSPITAL_COMMUNITY): Payer: Medicare Other

## 2021-11-02 ENCOUNTER — Encounter (HOSPITAL_COMMUNITY): Payer: Medicare Other

## 2021-11-02 ENCOUNTER — Ambulatory Visit: Payer: Medicare Other | Admitting: Vascular Surgery

## 2021-11-16 ENCOUNTER — Ambulatory Visit (HOSPITAL_COMMUNITY)
Admission: RE | Admit: 2021-11-16 | Discharge: 2021-11-16 | Disposition: A | Discharge status: Home / Self Care | Payer: Medicare Other | Source: Ambulatory Visit | Attending: Surgery | Admitting: Surgery

## 2021-11-16 ENCOUNTER — Ambulatory Visit (INDEPENDENT_AMBULATORY_CARE_PROVIDER_SITE_OTHER): Payer: Medicare Other | Admitting: Vascular Surgery

## 2021-11-16 ENCOUNTER — Encounter: Payer: Self-pay | Admitting: Vascular Surgery

## 2021-11-16 VITALS — BP 143/86 | HR 103 | Temp 98.0°F | Resp 16 | Ht 64.0 in | Wt 168.0 lb

## 2021-11-16 DIAGNOSIS — I70211 Atherosclerosis of native arteries of extremities with intermittent claudication, right leg: Secondary | ICD-10-CM | POA: Insufficient documentation

## 2021-11-16 NOTE — Progress Notes (Signed)
Patient name: Cindy Baker MRN: [id number] DOB: [birth year] Sex: female  REASON FOR CONSULT: 1 year follow-up for intermittent claudication of the lower extremities  HPI: Cindy Baker is a 56 y.o. female, with history of hyperlipidemia, diabetes, fibromyalgia that presents for 1 year follow-up of lower extremity claudication. Patient has had a very complex history including chronic lower back pain and has required lumbar decompression at L5-S1 back in 2014 with Dr. Tonita Cong.  She feels some of her problems have been ongoing since her back surgery with numbness in her right leg.  I initially saw her with right calf claudication after half a mile.  She had an abnormal monophasic waveform at right ankle.  I did start her on cilostazol years ago with recommendation for medical management.  Calf cramping ultimately resolved.  She has no limitation at this time.  Still smoking a pack every 3 to 4 days.  No new complaints.  Past Medical History:  Diagnosis Date   Abnormal Pap smear of cervix    Adrenal nodule (HCC)    Allergy    Anemia    Anxiety    Arthritis    osteoarthritis   Chronic gum disease    Depression    Diabetes mellitus without complication ([place])    Diabetic neuropathy ([place])    ENDOMETRIOSIS 08/17/2006   Qualifier: Diagnosis of  By: Samara Snide  .  States no longer has   Fibromyalgia    Gastroparesis    GERD (gastroesophageal reflux disease)    H. pylori infection    hx of 1 yr ago    Hyperlipidemia    Hypertension    on no meds    IBS (irritable bowel syndrome)    Osteopenia    Shortness of breath    related to fibromyalgia   UTI (lower urinary tract infection)     Past Surgical History:  Procedure Laterality Date   ADENOIDECTOMY     CHOLECYSTECTOMY N/A 11/02/2020   Procedure: LAPAROSCOPIC CHOLECYSTECTOMY WITH INTRAOPERATIVE CHOLANGIOGRAM;  Surgeon: Armandina Gemma, MD;  Location: [facility];  Service: General;  Laterality: N/A;   DENTAL SURGERY     LUMBAR  LAMINECTOMY/DECOMPRESSION MICRODISCECTOMY  07/25/2012   Procedure: LUMBAR LAMINECTOMY/DECOMPRESSION MICRODISCECTOMY 1 LEVEL;  Surgeon: Johnn Hai, MD;  Location: [facility];  Service: Orthopedics;  Laterality: N/A;  MICRODISCECTOMY L5-S1, FORAMINOTOMY L5-S1   TUBAL LIGATION     TYMPANOPLASTY      Family History  Problem Relation Age of Onset   Lung cancer Father    Cancer Paternal Aunt        cervical   Breast cancer Maternal Grandmother        44s   Cancer Maternal Grandmother        breast   Cancer Paternal Grandfather        lung   Esophageal cancer Other    Colon cancer Neg Hx    Colon polyps Neg Hx    Rectal cancer Neg Hx    Stomach cancer Neg Hx     SOCIAL HISTORY: Social History   Socioeconomic History   Marital status: Divorced    Spouse name: Not on file   Number of children: Not on file   Years of education: Not on file   Highest education level: Not on file  Occupational History   Not on file  Tobacco Use   Smoking status: Every Day    Packs/day: 0.50    Years: 13.00    Pack years:  6.50    Types: Cigarettes   Smokeless tobacco: Never  Vaping Use   Vaping Use: Former  Substance and Sexual Activity   Alcohol use: Yes    Comment: occassionally beer   Drug use: Not Currently    Comment: Not currently using marijuana, last used 2 years ago   Sexual activity: Not Currently    Birth control/protection: Surgical  Other Topics Concern   Not on file  Social History Narrative   Not on file   Social Determinants of Health   Financial Resource Strain: Not on file  Food Insecurity: Not on file  Transportation Needs: Not on file  Physical Activity: Not on file  Stress: Not on file  Social Connections: Not on file  Intimate Partner Violence: Not on file    Allergies  Allergen Reactions   Neomycin Other (See Comments)    Eyes swelling    Current Outpatient Medications  Medication Sig Dispense Refill   albuterol (VENTOLIN HFA) 108 (90 Base) MCG/ACT  inhaler Inhale 2 puffs into the lungs every 4 (four) hours as needed for wheezing or shortness of breath. 17 g 2   Alpha-Lipoic Acid 100 MG CAPS Take by mouth.     B-D UF III MINI PEN NEEDLES 31G X 5 MM MISC U BID UTD     B-D UF III MINI PEN NEEDLES 31G X 5 MM MISC USE TWICE DAILY AS DIRECTED 100 each 3   Blood Glucose Monitoring Suppl (ACCU-CHEK AVIVA CONNECT) w/Device KIT 1 each by Does not apply route 4 (four) times daily -  before meals and at bedtime. 1 kit 0   calcium-vitamin D (OSCAL WITH D) 250-125 MG-UNIT tablet Take 1 tablet by mouth daily.     cilostazol (PLETAL) 100 MG tablet TAKE 1 TABLET(100 MG) BY MOUTH TWICE DAILY BEFORE A MEAL 60 tablet 11   famotidine (PEPCID) 20 MG tablet Take 20 mg by mouth 2 (two) times daily.     fluticasone (FLONASE) 50 MCG/ACT nasal spray Place 2 sprays into both nostrils daily. (Patient taking differently: Place 2 sprays into both nostrils daily as needed for allergies.) 16 g 6   glipiZIDE (GLUCOTROL) 10 MG tablet Take 10 mg by mouth 2 (two) times daily before a meal.     glucose blood (ACCU-CHEK AVIVA) test strip Three times per day prior to meals and at bedtime. Use as instructed 100 each 12   hydrOXYzine (ATARAX/VISTARIL) 50 MG tablet Take 50-100 mg by mouth in the morning and at bedtime.     Lancets (ACCU-CHEK SOFT TOUCH) lancets Use as instructed 100 each 12   loratadine (CLARITIN) 10 MG tablet Take 10 mg by mouth daily.     losartan (COZAAR) 25 MG tablet Take 25 mg by mouth daily.     metoCLOPramide (REGLAN) 5 MG tablet Take 5 mg by mouth 3 (three) times daily before meals.     Multiple Vitamin (MULTIVITAMIN ADULT PO) Take by mouth.     pantoprazole (PROTONIX) 40 MG tablet Take 40 mg by mouth 2 (two) times daily.     rosuvastatin (CRESTOR) 20 MG tablet Take 20 mg by mouth at bedtime.     senna (SENOKOT) 8.6 MG TABS tablet Take 2 tablets (17.2 mg total) by mouth 2 (two) times daily. 30 tablet 0   sitaGLIPtin-metformin (JANUMET) 50-1000 MG tablet Take  1 tablet by mouth 2 (two) times daily with a meal. 60 tablet 5   traZODone (DESYREL) 50 MG tablet Take 50 mg by mouth at  bedtime.     vitamin B-12 (CYANOCOBALAMIN) 100 MCG tablet Take 100 mcg by mouth daily.     docusate sodium (COLACE) 100 MG capsule Take 1 capsule (100 mg total) by mouth 2 (two) times daily. While taking narcotic pain medicine. (Patient not taking: Reported on 11/16/2021) 30 capsule 0   levocetirizine (XYZAL) 5 MG tablet Take 1 tablet (5 mg total) by mouth every evening. (Patient not taking: Reported on 11/16/2021) 30 tablet 2   No current facility-administered medications for this visit.    REVIEW OF SYSTEMS:  [X] denotes positive finding, [ ] denotes negative finding Cardiac  Comments:  Chest pain or chest pressure:    Shortness of breath upon exertion:    Short of breath when lying flat:    Irregular heart rhythm:        Vascular    Pain in calf, thigh, or hip brought on by ambulation:    Pain in feet at night that wakes you up from your sleep:     Blood clot in your veins:    Leg swelling:         Pulmonary    Oxygen at home:    Productive cough:     Wheezing:         Neurologic    Sudden weakness in arms or legs:     Sudden numbness in arms or legs:     Sudden onset of difficulty speaking or slurred speech:    Temporary loss of vision in one eye:     Problems with dizziness:         Gastrointestinal    Blood in stool:     Vomited blood:         Genitourinary    Burning when urinating:     Blood in urine:        Psychiatric    Major depression:         Hematologic    Bleeding problems:    Problems with blood clotting too easily:        Skin    Rashes or ulcers:        Constitutional    Fever or chills:      PHYSICAL EXAM: Vitals:   11/16/21 1333  BP: (!) 143/86  Pulse: (!) 103  Resp: 16  Temp: 98 F (36.7 C)  TempSrc: Temporal  SpO2: 96%  Weight: 168 lb (76.2 kg)  Height: 5' 4" (1.626 m)    GENERAL: The patient is a  well-nourished female, in no acute distress. The vital signs are documented above. CARDIAC: There is a regular rate and rhythm.  VASCULAR:  Palpable femoral pulses both groins Bilateral PT pulses palpable  No tissue loss PULMONARY: No respiratory distress. ABDOMEN: Soft and non-tender. MUSCULOSKELETAL: There are no major deformities or cyanosis. NEUROLOGIC: No focal weakness or paresthesias are detected. PSYCHIATRIC: The patient has a normal affect.  DATA:   ABIs today are 1.14 right triphasic and 1.10 left triphasic   Assessment/Plan:  56 year old female who presents for 1 year follow-up of her right lower extremity calf claudication.  Initially she had cramping after about 1/2 mile.  I put her on Pletal several years ago with significant improvement.  She really has no complaints today.  Her ABIs are normal.  Discussed she can follow-up with me in 2 years with ABIs.  She will let me know if she has any other problems.  The Pletal seems to work really well for her and again  discussed smoking cessation.  Marty Heck, MD [facility] Office: [phone]

## 2022-03-09 ENCOUNTER — Other Ambulatory Visit: Payer: Self-pay

## 2022-03-09 DIAGNOSIS — M25512 Pain in left shoulder: Secondary | ICD-10-CM

## 2022-04-11 ENCOUNTER — Other Ambulatory Visit (HOSPITAL_COMMUNITY): Payer: Self-pay

## 2022-04-12 ENCOUNTER — Other Ambulatory Visit (HOSPITAL_COMMUNITY): Payer: Self-pay

## 2022-04-12 MED ORDER — HYDROCODONE-ACETAMINOPHEN 10-325 MG PO TABS
1.0000 | ORAL_TABLET | ORAL | 0 refills | Status: DC
  Filled 2022-04-12: qty 150, 30d supply, fill #0

## 2022-04-18 ENCOUNTER — Ambulatory Visit
Admission: RE | Admit: 2022-04-18 | Discharge: 2022-04-18 | Disposition: A | Discharge status: Home / Self Care | Payer: Medicare Other | Source: Ambulatory Visit | Attending: Family | Admitting: Family

## 2022-04-18 DIAGNOSIS — M25512 Pain in left shoulder: Secondary | ICD-10-CM

## 2022-05-09 ENCOUNTER — Encounter: Payer: Self-pay | Admitting: Family

## 2022-05-09 ENCOUNTER — Other Ambulatory Visit: Payer: Self-pay | Admitting: Family

## 2022-05-09 DIAGNOSIS — R911 Solitary pulmonary nodule: Secondary | ICD-10-CM

## 2022-05-23 ENCOUNTER — Other Ambulatory Visit: Payer: Self-pay | Admitting: Family

## 2022-05-23 ENCOUNTER — Other Ambulatory Visit: Payer: Medicare Other

## 2022-05-23 DIAGNOSIS — Z1231 Encounter for screening mammogram for malignant neoplasm of breast: Secondary | ICD-10-CM

## 2022-06-02 ENCOUNTER — Other Ambulatory Visit (HOSPITAL_COMMUNITY): Payer: Self-pay

## 2022-06-02 MED ORDER — HYDROCODONE-ACETAMINOPHEN 10-325 MG PO TABS
1.0000 | ORAL_TABLET | Freq: Every day | ORAL | 0 refills | Status: AC
  Filled 2022-06-02 – 2022-06-10 (×2): qty 150, 30d supply, fill #0

## 2022-06-03 ENCOUNTER — Other Ambulatory Visit (HOSPITAL_COMMUNITY): Payer: Self-pay

## 2022-06-10 ENCOUNTER — Other Ambulatory Visit (HOSPITAL_COMMUNITY): Payer: Self-pay

## 2022-07-11 ENCOUNTER — Other Ambulatory Visit (HOSPITAL_COMMUNITY): Payer: Self-pay

## 2022-07-11 ENCOUNTER — Other Ambulatory Visit: Payer: Self-pay

## 2022-07-11 MED ORDER — HYDROCODONE-ACETAMINOPHEN 10-325 MG PO TABS
1.0000 | ORAL_TABLET | Freq: Every day | ORAL | 0 refills | Status: AC | PRN
  Filled 2022-07-11 – 2022-07-18 (×2): qty 150, 30d supply, fill #0

## 2022-07-15 ENCOUNTER — Other Ambulatory Visit (HOSPITAL_COMMUNITY): Payer: Self-pay

## 2022-07-18 ENCOUNTER — Other Ambulatory Visit: Payer: Self-pay

## 2022-07-18 ENCOUNTER — Other Ambulatory Visit (HOSPITAL_COMMUNITY): Payer: Self-pay

## 2022-07-18 ENCOUNTER — Ambulatory Visit
Admission: RE | Admit: 2022-07-18 | Discharge: 2022-07-18 | Disposition: A | Discharge status: Home / Self Care | Payer: 59 | Source: Ambulatory Visit | Attending: Family | Admitting: Family

## 2022-07-18 DIAGNOSIS — Z1231 Encounter for screening mammogram for malignant neoplasm of breast: Secondary | ICD-10-CM

## 2022-08-09 ENCOUNTER — Other Ambulatory Visit (HOSPITAL_COMMUNITY): Payer: Self-pay

## 2022-08-09 MED ORDER — HYDROCODONE-ACETAMINOPHEN 10-325 MG PO TABS
1.0000 | ORAL_TABLET | Freq: Every day | ORAL | 0 refills | Status: DC
  Filled 2022-08-16 – 2022-08-17 (×2): qty 150, 30d supply, fill #0

## 2022-08-16 ENCOUNTER — Other Ambulatory Visit (HOSPITAL_COMMUNITY): Payer: Self-pay

## 2022-08-17 ENCOUNTER — Other Ambulatory Visit (HOSPITAL_COMMUNITY): Payer: Self-pay

## 2022-09-12 ENCOUNTER — Other Ambulatory Visit (HOSPITAL_COMMUNITY): Payer: Self-pay

## 2022-09-12 MED ORDER — HYDROCODONE-ACETAMINOPHEN 10-325 MG PO TABS
1.0000 | ORAL_TABLET | Freq: Every day | ORAL | 0 refills | Status: AC
  Filled 2022-09-12 – 2022-09-16 (×2): qty 150, 30d supply, fill #0

## 2022-09-13 ENCOUNTER — Other Ambulatory Visit (HOSPITAL_COMMUNITY): Payer: Self-pay

## 2022-09-16 ENCOUNTER — Other Ambulatory Visit (HOSPITAL_COMMUNITY): Payer: Self-pay

## 2022-10-09 ENCOUNTER — Other Ambulatory Visit: Payer: Self-pay | Admitting: Vascular Surgery

## 2022-10-11 ENCOUNTER — Other Ambulatory Visit (HOSPITAL_COMMUNITY): Payer: Self-pay

## 2022-10-11 MED ORDER — HYDROCODONE-ACETAMINOPHEN 10-325 MG PO TABS
1.0000 | ORAL_TABLET | Freq: Every day | ORAL | 0 refills | Status: DC
  Filled 2022-10-17: qty 150, 30d supply, fill #0

## 2022-10-17 ENCOUNTER — Other Ambulatory Visit (HOSPITAL_COMMUNITY): Payer: Self-pay

## 2022-11-15 ENCOUNTER — Other Ambulatory Visit (HOSPITAL_COMMUNITY): Payer: Self-pay

## 2022-11-15 MED ORDER — HYDROCODONE-ACETAMINOPHEN 10-325 MG PO TABS
1.0000 | ORAL_TABLET | Freq: Every day | ORAL | 0 refills | Status: DC
  Filled 2022-11-15 – 2022-11-16 (×2): qty 150, 30d supply, fill #0

## 2022-11-16 ENCOUNTER — Other Ambulatory Visit (HOSPITAL_COMMUNITY): Payer: Self-pay

## 2023-01-16 ENCOUNTER — Other Ambulatory Visit (HOSPITAL_COMMUNITY): Payer: Self-pay

## 2023-01-16 MED ORDER — HYDROCODONE-ACETAMINOPHEN 10-325 MG PO TABS
1.0000 | ORAL_TABLET | Freq: Every day | ORAL | 0 refills | Status: AC
  Filled 2023-01-16: qty 150, 30d supply, fill #0

## 2023-02-13 ENCOUNTER — Other Ambulatory Visit (HOSPITAL_COMMUNITY): Payer: Self-pay

## 2023-02-13 MED ORDER — HYDROCODONE-ACETAMINOPHEN 10-325 MG PO TABS
1.0000 | ORAL_TABLET | Freq: Every day | ORAL | 0 refills | Status: AC
  Filled 2023-02-13 – 2023-02-16 (×2): qty 150, 30d supply, fill #0

## 2023-02-16 ENCOUNTER — Other Ambulatory Visit (HOSPITAL_COMMUNITY): Payer: Self-pay

## 2023-03-16 ENCOUNTER — Other Ambulatory Visit: Payer: Self-pay | Admitting: Family

## 2023-03-16 DIAGNOSIS — Z0000 Encounter for general adult medical examination without abnormal findings: Secondary | ICD-10-CM

## 2023-03-20 ENCOUNTER — Other Ambulatory Visit (HOSPITAL_COMMUNITY): Payer: Self-pay

## 2023-03-20 MED ORDER — HYDROCODONE-ACETAMINOPHEN 10-325 MG PO TABS
1.0000 | ORAL_TABLET | Freq: Every day | ORAL | 0 refills | Status: AC
  Filled 2023-03-20: qty 150, 30d supply, fill #0

## 2023-07-24 ENCOUNTER — Ambulatory Visit
Admission: RE | Admit: 2023-07-24 | Discharge: 2023-07-24 | Disposition: A | Discharge status: Home / Self Care | Payer: 59 | Source: Ambulatory Visit | Attending: Family

## 2023-07-24 DIAGNOSIS — Z0000 Encounter for general adult medical examination without abnormal findings: Secondary | ICD-10-CM

## 2024-04-12 ENCOUNTER — Other Ambulatory Visit: Payer: Self-pay | Admitting: Vascular Surgery

## 2024-04-12 ENCOUNTER — Other Ambulatory Visit: Payer: Self-pay

## 2024-04-12 MED ORDER — CILOSTAZOL 100 MG PO TABS: 100.0000 mg | ORAL_TABLET | Freq: Two times a day (BID) | ORAL | 1 refills | Status: DC

## 2024-04-23 ENCOUNTER — Other Ambulatory Visit: Payer: Self-pay | Admitting: Vascular Surgery

## 2024-04-23 DIAGNOSIS — I739 Peripheral vascular disease, unspecified: Secondary | ICD-10-CM

## 2024-05-04 ENCOUNTER — Other Ambulatory Visit: Payer: Self-pay | Admitting: Vascular Surgery

## 2024-06-11 ENCOUNTER — Ambulatory Visit (INDEPENDENT_AMBULATORY_CARE_PROVIDER_SITE_OTHER): Admitting: Vascular Surgery

## 2024-06-11 ENCOUNTER — Encounter: Payer: Self-pay | Admitting: Vascular Surgery

## 2024-06-11 ENCOUNTER — Ambulatory Visit (HOSPITAL_COMMUNITY)
Admission: RE | Admit: 2024-06-11 | Discharge: 2024-06-11 | Disposition: A | Discharge status: Home / Self Care | Source: Ambulatory Visit | Attending: Surgery | Admitting: Surgery

## 2024-06-11 VITALS — BP 142/89 | HR 90 | Temp 98.0°F | Resp 20 | Ht 64.0 in | Wt 171.9 lb

## 2024-06-11 DIAGNOSIS — I739 Peripheral vascular disease, unspecified: Secondary | ICD-10-CM | POA: Insufficient documentation

## 2024-06-11 DIAGNOSIS — I70211 Atherosclerosis of native arteries of extremities with intermittent claudication, right leg: Secondary | ICD-10-CM | POA: Diagnosis not present

## 2024-06-11 LAB — VAS US ABI WITH/WO TBI
Left ABI: 0.98
Right ABI: 1.01

## 2024-06-11 MED ORDER — CILOSTAZOL 100 MG PO TABS: 100.0000 mg | ORAL_TABLET | Freq: Two times a day (BID) | ORAL | 3 refills | Status: AC

## 2024-06-11 NOTE — Progress Notes (Signed)
 "   Patient name: Cindy Baker MRN: [id number] DOB: 10-11-1965 Sex: female  REASON FOR CONSULT: 2 year follow-up for intermittent claudication of the lower extremities  HPI: Cindy Baker is a 58 y.o. female, with history of hyperlipidemia, diabetes, fibromyalgia that presents for 2 year follow-up of lower extremity claudication. Patient has had a very complex history including chronic lower back pain and has required lumbar decompression at L5-S1 back in 2014 with Dr. Duwayne.    I initially saw her with right calf claudication after half a mile.  She had an abnormal monophasic waveform at right ankle.  I did start her on cilostazol  years ago with recommendation for medical management.  Calf cramping ultimately resolved.   Today she is quit smoking.  States she is exercising 3-4 times a week usually for 3 to 4 miles walking on a treadmill and using a bicycle.  No lower extremity complaints.  The Pletal  has helped her remarkably.  Her statin therapy was just increased to 20 mg daily  Past Medical History:  Diagnosis Date   Abnormal Pap smear of cervix    Adrenal nodule    Allergy    Anemia    Anxiety    Arthritis    osteoarthritis   Chronic gum disease    Depression    Diabetes mellitus without complication (HCC)    Diabetic neuropathy (HCC)    ENDOMETRIOSIS 08/17/2006   Qualifier: Diagnosis of  By: Manford Longs  .  States no longer has   Fibromyalgia    Gastroparesis    GERD (gastroesophageal reflux disease)    H. pylori infection    hx of 1 yr ago    Hyperlipidemia    Hypertension    on no meds    IBS (irritable bowel syndrome)    Osteopenia    Peripheral vascular disease    Shortness of breath    related to fibromyalgia   UTI (lower urinary tract infection)     Past Surgical History:  Procedure Laterality Date   ADENOIDECTOMY     CHOLECYSTECTOMY N/A 11/02/2020   Procedure: LAPAROSCOPIC CHOLECYSTECTOMY WITH INTRAOPERATIVE CHOLANGIOGRAM;  Surgeon: Eletha Boas, MD;   Location: [facility];  Service: General;  Laterality: N/A;   DENTAL SURGERY     LUMBAR LAMINECTOMY/DECOMPRESSION MICRODISCECTOMY  07/25/2012   Procedure: LUMBAR LAMINECTOMY/DECOMPRESSION MICRODISCECTOMY 1 LEVEL;  Surgeon: Reyes JAYSON Duwayne, MD;  Location: [facility];  Service: Orthopedics;  Laterality: N/A;  MICRODISCECTOMY L5-S1, FORAMINOTOMY L5-S1   TUBAL LIGATION     TYMPANOPLASTY      Family History  Problem Relation Age of Onset   Lung cancer Father    Cancer Paternal Aunt        cervical   Breast cancer Maternal Grandmother        71s   Cancer Maternal Grandmother        breast   Cancer Paternal Grandfather        lung   Esophageal cancer Other    Colon cancer Neg Hx    Colon polyps Neg Hx    Rectal cancer Neg Hx    Stomach cancer Neg Hx     SOCIAL HISTORY: Social History   Socioeconomic History   Marital status: Divorced    Spouse name: Not on file   Number of children: Not on file   Years of education: Not on file   Highest education level: Not on file  Occupational History   Not on file  Tobacco Use  Smoking status: Former    Current packs/day: 0.00    Average packs/day: 0.5 packs/day for 13.0 years (6.5 ttl pk-yrs)    Types: Cigarettes    Quit date: 03/12/2024    Years since quitting: 0.2   Smokeless tobacco: Never  Vaping Use   Vaping status: Former  Substance and Sexual Activity   Alcohol  use: Yes    Comment: occassionally beer   Drug use: Not Currently    Comment: Not currently using marijuana, last used 2 years ago   Sexual activity: Not Currently    Birth control/protection: Surgical  Other Topics Concern   Not on file  Social History Narrative   Not on file   Social Drivers of Health   Tobacco Use: Medium Risk (06/11/2024)   Patient History    Smoking Tobacco Use: Former    Smokeless Tobacco Use: Never    Passive Exposure: Not on Actuary Strain: Not on file  Food Insecurity: Not on file  Transportation Needs: Not on file   Physical Activity: Not on file  Stress: Not on file  Social Connections: Not on file  Intimate Partner Violence: Not on file  Depression (PHQ2-9): Not on file  Alcohol  Screen: Not on file  Housing: Not on file  Utilities: Not on file  Health Literacy: Not on file    Allergies  Allergen Reactions   Neomycin Other (See Comments)    Eyes swelling   Ondansetron  Hives and Rash    Current Outpatient Medications  Medication Sig Dispense Refill   albuterol  (VENTOLIN  HFA) 108 (90 Base) MCG/ACT inhaler Inhale 2 puffs into the lungs every 4 (four) hours as needed for wheezing or shortness of breath. 17 g 2   Alpha-Lipoic Acid 100 MG CAPS Take by mouth.     B-D UF III MINI PEN NEEDLES 31G X 5 MM MISC U BID UTD     B-D UF III MINI PEN NEEDLES 31G X 5 MM MISC USE TWICE DAILY AS DIRECTED 100 each 3   Blood Glucose Monitoring Suppl (ACCU-CHEK AVIVA CONNECT) w/Device KIT 1 each by Does not apply route 4 (four) times daily -  before meals and at bedtime. 1 kit 0   calcium-vitamin D  (OSCAL WITH D) 250-125 MG-UNIT tablet Take 1 tablet by mouth daily.     cilostazol  (PLETAL ) 100 MG tablet TAKE 1 TABLET BY MOUTH TWICE A DAY 180 tablet 1   famotidine  (PEPCID ) 20 MG tablet Take 20 mg by mouth 2 (two) times daily.     fluticasone  (FLONASE ) 50 MCG/ACT nasal spray Place 2 sprays into both nostrils daily. (Patient taking differently: Place 2 sprays into both nostrils daily as needed for allergies.) 16 g 6   glipiZIDE  (GLUCOTROL ) 10 MG tablet Take 10 mg by mouth 2 (two) times daily before a meal.     glucose blood (ACCU-CHEK AVIVA) test strip Three times per day prior to meals and at bedtime. Use as instructed 100 each 12   HYDROcodone -acetaminophen  (NORCO) 10-325 MG tablet Take 1 tablet by mouth 5 (five) times daily as needed 150 tablet 0   HYDROcodone -acetaminophen  (NORCO) 10-325 MG tablet Take 1 tablet by mouth 5 (five) times daily as needed. 150 tablet 0   HYDROcodone -acetaminophen  (NORCO) 10-325 MG tablet  Take 1 tablet by mouth 5 (five) times daily as needed. 150 tablet 0   HYDROcodone -acetaminophen  (NORCO) 10-325 MG tablet Take 1 tablet by mouth 5 (five) times daily as needed 151 tablet 0   HYDROcodone -acetaminophen  (NORCO) 10-325 MG  tablet Take 1 tablet by mouth 5 (five) times daily as needed 150 tablet 0   HYDROcodone -acetaminophen  (NORCO) 10-325 MG tablet Take 1 tablet by mouth 5 (five) times daily as needed 150 tablet 0   hydrOXYzine (ATARAX/VISTARIL) 50 MG tablet Take 50-100 mg by mouth in the morning and at bedtime.     Lancets (ACCU-CHEK SOFT TOUCH) lancets Use as instructed 100 each 12   loratadine (CLARITIN) 10 MG tablet Take 10 mg by mouth daily.     losartan  (COZAAR ) 25 MG tablet Take 25 mg by mouth daily.     metoCLOPramide (REGLAN) 5 MG tablet Take 5 mg by mouth 3 (three) times daily before meals.     Multiple Vitamin (MULTIVITAMIN ADULT PO) Take by mouth.     pantoprazole  (PROTONIX ) 40 MG tablet Take 40 mg by mouth 2 (two) times daily.     rosuvastatin (CRESTOR) 20 MG tablet Take 20 mg by mouth at bedtime.     senna (SENOKOT) 8.6 MG TABS tablet Take 2 tablets (17.2 mg total) by mouth 2 (two) times daily. 30 tablet 0   sitaGLIPtin -metformin  (JANUMET ) 50-1000 MG tablet Take 1 tablet by mouth 2 (two) times daily with a meal. 60 tablet 5   traZODone  (DESYREL ) 50 MG tablet Take 50 mg by mouth at bedtime.     vitamin B-12 (CYANOCOBALAMIN) 100 MCG tablet Take 100 mcg by mouth daily.     No current facility-administered medications for this visit.    REVIEW OF SYSTEMS:  [X]  denotes positive finding, [ ]  denotes negative finding Cardiac  Comments:  Chest pain or chest pressure:    Shortness of breath upon exertion:    Short of breath when lying flat:    Irregular heart rhythm:        Vascular    Pain in calf, thigh, or hip brought on by ambulation:    Pain in feet at night that wakes you up from your sleep:     Blood clot in your veins:    Leg swelling:         Pulmonary     Oxygen at home:    Productive cough:     Wheezing:         Neurologic    Sudden weakness in arms or legs:     Sudden numbness in arms or legs:     Sudden onset of difficulty speaking or slurred speech:    Temporary loss of vision in one eye:     Problems with dizziness:         Gastrointestinal    Blood in stool:     Vomited blood:         Genitourinary    Burning when urinating:     Blood in urine:        Psychiatric    Major depression:         Hematologic    Bleeding problems:    Problems with blood clotting too easily:        Skin    Rashes or ulcers:        Constitutional    Fever or chills:      PHYSICAL EXAM: Vitals:   06/11/24 1536  BP: (!) 142/89  Pulse: 90  Resp: 20  Temp: 98 F (36.7 C)  TempSrc: Temporal  SpO2: 99%  Weight: 171 lb 14.4 oz (78 kg)  Height: 5' 4 (1.626 m)    GENERAL: The patient is a well-nourished female, in no acute  distress. The vital signs are documented above. CARDIAC: There is a regular rate and rhythm.  VASCULAR:  Palpable femoral pulses both groins Bilateral PT pulses palpable  No tissue loss PULMONARY: No respiratory distress. ABDOMEN: Soft and non-tender. MUSCULOSKELETAL: There are no major deformities or cyanosis. NEUROLOGIC: No focal weakness or paresthesias are detected. PSYCHIATRIC: The patient has a normal affect.  DATA:   ABIs today are 1.01 on the right triphasic and 0.98 on the left triphasic  Assessment/Plan:  59 year old female who presents for 2 year follow-up of her right lower extremity calf claudication.  Initially she had right calf cramping after about 1/2 mile.  I put her on Pletal  years ago with significant improvement.  She really has no complaints today.  Her ABIs are normal.  Discussed she can follow-up with me in 2 years with ABIs.  She will let me know if she has any other problems.   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  Proud she has quit smoking and also  exercising 3-4 times a week.  Her Crestor was just increased to 20 mg a day and she states PCP is following her cholesterol.  Cindy DOROTHA Gaskins, MD [facility] Office: [phone]     "
